# Patient Record
Sex: Female | Born: 1970 | Race: White | Hispanic: No | Marital: Single | Smoking: Never smoker
Health system: Southern US, Community
[De-identification: ages and names within clinical notes are randomized; demographics above are authoritative.]

## PROBLEM LIST (undated history)

## (undated) DIAGNOSIS — Z923 Personal history of irradiation: Secondary | ICD-10-CM

## (undated) DIAGNOSIS — C50919 Malignant neoplasm of unspecified site of unspecified female breast: Secondary | ICD-10-CM

## (undated) DIAGNOSIS — I499 Cardiac arrhythmia, unspecified: Secondary | ICD-10-CM

## (undated) DIAGNOSIS — Z9221 Personal history of antineoplastic chemotherapy: Secondary | ICD-10-CM

## (undated) DIAGNOSIS — Z1371 Encounter for nonprocreative screening for genetic disease carrier status: Secondary | ICD-10-CM

## (undated) HISTORY — DX: Malignant neoplasm of unspecified site of unspecified female breast: C50.919

## (undated) HISTORY — DX: Encounter for nonprocreative screening for genetic disease carrier status: Z13.71

---

## 2011-06-19 ENCOUNTER — Emergency Department: Payer: Self-pay | Admitting: Emergency Medicine

## 2011-06-19 LAB — CBC
HCT: 43 % (ref 35.0–47.0)
HGB: 14.8 g/dL (ref 12.0–16.0)
MCH: 32 pg (ref 26.0–34.0)
MCV: 93 fL (ref 80–100)
RBC: 4.62 10*6/uL (ref 3.80–5.20)
WBC: 11.4 10*3/uL — ABNORMAL HIGH (ref 3.6–11.0)

## 2011-06-19 LAB — BASIC METABOLIC PANEL
Anion Gap: 14 (ref 7–16)
Creatinine: 0.75 mg/dL (ref 0.60–1.30)
EGFR (African American): >60
EGFR (Non-African Amer.): >60
Glucose: 103 mg/dL — ABNORMAL HIGH (ref 65–99)
Osmolality: 282 (ref 275–301)
Potassium: 3.8 mmol/L (ref 3.5–5.1)

## 2011-06-19 LAB — TROPONIN I: Troponin-I: <0.02 ng/mL

## 2011-06-20 LAB — TROPONIN I: Troponin-I: <0.02 ng/mL

## 2011-06-20 LAB — CK TOTAL AND CKMB (NOT AT ARMC): CK-MB: 0.5 ng/mL (ref 0.5–3.6)

## 2011-09-17 ENCOUNTER — Ambulatory Visit: Payer: Self-pay | Admitting: Otolaryngology

## 2012-09-22 ENCOUNTER — Ambulatory Visit: Payer: Self-pay | Admitting: Internal Medicine

## 2013-05-19 HISTORY — PX: TONSILLECTOMY: SUR1361

## 2016-09-16 DIAGNOSIS — Z1371 Encounter for nonprocreative screening for genetic disease carrier status: Secondary | ICD-10-CM

## 2016-09-16 HISTORY — DX: Encounter for nonprocreative screening for genetic disease carrier status: Z13.71

## 2017-09-22 ENCOUNTER — Encounter: Payer: Self-pay | Admitting: *Deleted

## 2017-09-22 ENCOUNTER — Other Ambulatory Visit: Payer: Self-pay | Admitting: Internal Medicine

## 2017-09-22 DIAGNOSIS — Z1231 Encounter for screening mammogram for malignant neoplasm of breast: Secondary | ICD-10-CM

## 2017-09-22 DIAGNOSIS — N63 Unspecified lump in unspecified breast: Secondary | ICD-10-CM

## 2017-09-23 ENCOUNTER — Other Ambulatory Visit: Payer: Self-pay | Admitting: Internal Medicine

## 2017-09-23 DIAGNOSIS — N63 Unspecified lump in unspecified breast: Secondary | ICD-10-CM

## 2017-09-30 ENCOUNTER — Ambulatory Visit
Admission: RE | Admit: 2017-09-30 | Discharge: 2017-09-30 | Disposition: A | Discharge status: Home / Self Care | Payer: Managed Care, Other (non HMO) | Source: Ambulatory Visit | Attending: Internal Medicine | Admitting: Internal Medicine

## 2017-09-30 ENCOUNTER — Other Ambulatory Visit: Payer: Self-pay | Admitting: Internal Medicine

## 2017-09-30 ENCOUNTER — Encounter (HOSPITAL_COMMUNITY): Payer: Self-pay

## 2017-09-30 DIAGNOSIS — N6310 Unspecified lump in the right breast, unspecified quadrant: Secondary | ICD-10-CM

## 2017-09-30 DIAGNOSIS — R928 Other abnormal and inconclusive findings on diagnostic imaging of breast: Secondary | ICD-10-CM

## 2017-09-30 DIAGNOSIS — N63 Unspecified lump in unspecified breast: Secondary | ICD-10-CM

## 2017-09-30 DIAGNOSIS — C50919 Malignant neoplasm of unspecified site of unspecified female breast: Secondary | ICD-10-CM

## 2017-09-30 DIAGNOSIS — C50911 Malignant neoplasm of unspecified site of right female breast: Secondary | ICD-10-CM | POA: Diagnosis not present

## 2017-09-30 HISTORY — DX: Malignant neoplasm of unspecified site of unspecified female breast: C50.919

## 2017-09-30 HISTORY — PX: BREAST BIOPSY: SHX20

## 2017-10-02 ENCOUNTER — Other Ambulatory Visit: Payer: Self-pay | Admitting: Pathology

## 2017-10-08 ENCOUNTER — Encounter: Payer: Self-pay | Admitting: *Deleted

## 2017-10-08 NOTE — Progress Notes (Signed)
  Oncology Nurse Navigator Documentation  Navigator Location: [facility] (10/08/17 1600)   )Navigator Encounter Type: Introductory phone call (10/08/17 1600)   Abnormal Finding Date: 09/30/17 (10/08/17 1600) Confirmed Diagnosis Date: 10/01/17 (10/08/17 1600)                   Barriers/Navigation Needs: Education;Coordination of Care (10/08/17 1600) Education: Newly Diagnosed Cancer Education (10/08/17 1600) Interventions: Coordination of Care (10/08/17 1600)   Coordination of Care: Appts (10/08/17 1600)                  Time Spent with Patient: 45 (10/08/17 1600)   Called Dr. Jennette Kettle office today to inquire if patient had been notified of her diagnosis.  His office states she saw Dr. Lavera Guise on the 17th for results, and is scheduled to see Dr. Bary Castilla.  Called patient to establish navigation services.  States she wants to see Dr. Bary Castilla sooner than scheduled.  Told her I would try and work on that for her.  I have rescheduled her consult to Tuesday, 10/13/17 at 7:15 am.  She is to take a photo ID, all her meds and insurance cards. She came by the cancer center today and picked up  patient breast cancer educational literature, "My Breast Cancer Treatment Handbook" by Josephine Igo, RN.  She is to call if she has any questions or needs.

## 2017-10-12 ENCOUNTER — Encounter: Payer: Self-pay | Admitting: *Deleted

## 2017-10-13 ENCOUNTER — Other Ambulatory Visit: Payer: Self-pay | Admitting: General Surgery

## 2017-10-13 ENCOUNTER — Encounter: Payer: Self-pay | Admitting: General Surgery

## 2017-10-13 ENCOUNTER — Ambulatory Visit: Payer: Managed Care, Other (non HMO) | Admitting: General Surgery

## 2017-10-13 ENCOUNTER — Telehealth: Payer: Self-pay

## 2017-10-13 VITALS — BP 118/68 | HR 76 | Resp 12 | Ht 63.0 in | Wt 151.0 lb

## 2017-10-13 DIAGNOSIS — Z170 Estrogen receptor positive status [ER+]: Secondary | ICD-10-CM | POA: Diagnosis not present

## 2017-10-13 DIAGNOSIS — C50411 Malignant neoplasm of upper-outer quadrant of right female breast: Secondary | ICD-10-CM | POA: Diagnosis not present

## 2017-10-13 LAB — SURGICAL PATHOLOGY

## 2017-10-13 MED ORDER — LIDOCAINE-PRILOCAINE 2.5-2.5 % EX CREA: TOPICAL_CREAM | CUTANEOUS | 0 refills | Status: DC

## 2017-10-13 NOTE — Progress Notes (Signed)
emla

## 2017-10-13 NOTE — Telephone Encounter (Signed)
Call to patient about their arrival time and location. The patient will report to the Radiology desk in the [place] at [facility] on 10/26/17 at 8:45 am per scheduling.

## 2017-10-13 NOTE — Progress Notes (Signed)
Patient ID: Crystal Branch, female   DOB: [birth year], 47 y.o.   MRN: [id number]  Chief Complaint  Patient presents with  . Breast Problem    HPI Crystal Branch is a 47 y.o. female.  who presents for a breast evaluation referred by Dr Lavera Guise. The most recent mammogram and right breast biopsy was done on 09-30-17.  Patient does perform regular self breast checks but does not get regular mammograms done.   She states she found a lump prior to her shower the first part of May. She states it was about the size of the end of her finger. She works in Architectural technologist at [employer].  HPI  Past Medical History:  Diagnosis Date  . 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 TONSILLECTOMY  2015    Family History  Problem Relation Age of Onset  . Breast cancer Maternal Aunt 60  . Breast cancer Maternal Grandmother 62    Social History Social History   Tobacco Use  . Smoking status: Never Smoker  . Smokeless tobacco: Never Used  Substance Use Topics  . Alcohol use: Never    Frequency: Never  . Drug use: Never    No Known Allergies  Current Outpatient Medications  Medication Sig Dispense Refill  . Misc Natural Products (OSTEO BI-FLEX JOINT SHIELD PO) Take by mouth daily.    . Multiple Vitamin (MULTIVITAMIN) capsule Take 1 capsule by mouth daily.    . Probiotic Product ([place]) CAPS Take by mouth daily.     No current facility-administered medications for this visit.     Review of Systems Review of Systems  Constitutional: Negative.   Respiratory: Negative.   Cardiovascular: Negative.     Blood pressure 118/68, pulse 76, resp. rate 12, height 5' 3"  (1.6 m), weight 151 lb (68.5 kg), last menstrual period 09/24/2017, SpO2 98 %.  Physical  Exam Physical Exam  Constitutional: She is oriented to person, place, and time. She appears well-developed and well-nourished.  HENT:  Mouth/Throat: Oropharynx is clear and moist.  Eyes: Conjunctivae are normal. No scleral icterus.  Neck: Neck supple.  Cardiovascular: Normal rate, regular rhythm and normal heart sounds.  Pulmonary/Chest: Effort normal and breath sounds normal. Right breast exhibits skin change. Right breast exhibits no inverted nipple, no mass, no nipple discharge and no tenderness. Left breast exhibits no inverted nipple, no mass, no nipple discharge, no skin change and no tenderness.  Bruising right breast biopsy site  Lymphadenopathy:    She has no cervical adenopathy.    She has no axillary adenopathy.  Neurological: She is alert and oriented to person, place, and time.  Skin: Skin is warm and dry.  Psychiatric: Her behavior is normal.    Data Reviewed A. RIGHT BREAST, 12:00, 3 CM FN; ULTRASOUND GUIDED CORE BIOPSY:  - INVASIVE MAMMARY CARCINOMA WITH MUCINOUS FEATURES.   Size of invasive carcinoma: 9 mm in this sample  Histologic grade of invasive carcinoma: Grade 1            Glandular/tubular differentiation score: 2            Nuclear pleomorphism score: 2            Mitotic rate score: 1  Total score: 5  Ductal carcinoma in situ: Not identified  Lymphovascular invasion: Not identified  BREAST BIOMARKER TESTS  Estrogen Receptor (ER) Status: POSITIVE  Percentage of cells with nuclear positivity: >90%  Average intensity of staining: Strong   Progesterone Receptor (PgR) Status: POSITIVE  Percentage of cells with nuclear positivity: >90%  Average intensity of staining: Moderate to strong   Bilateral diagnostic mammogram and right breast ultrasound of Sep 30, 2017 reviewed.  BI-RADS-5.  Left breast normal.  Single foci of abnormality in the right breast.  Assessment    Stage I, T1c, NX carcinoma of the  right breast, ER/PR positive.  HER-2/neu status pending.      Plan    The majority of the visit was spent reviewing the options for breast cancer treatment. Breast conservation with lumpectomy and radiation therapy  was presented as equivalent to mastectomy for long-term control. The pros and cons of each treatment regimen were reviewed. The indications for additional therapy such as chemotherapy were touched on briefly, realizing that the majority of information required to determine if chemotherapy would be of benefit is not available at this time.  The availability of second surgical opinion reviewed.  The patient's grandmother had a mastectomy, her maternal aunt had breast conservation surgery, given the 10-day interval of time the patient has had to review her options on her own since her biopsy results became available she is very much interested in breast conservation.  She is aware that the HER-2/neu is pending, but this would not change her options for breast management, only change recommendations regarding adjuvant chemotherapy.  Based on the T1c size, no indication for neoadjuvant chemotherapy.  Role of radiation therapy after breast conservation review.  Website information provided.  As she is under 50 I recommended genetic testing.  Although she has no children she does have a brother and this might be important, and for the patient herself the potential risk for ovarian cancer would be good to know.  This would not mandate mastectomy, and results are not needed prior to surgical intervention..  Laboratory testing will include routine laboratory studies including a CA 27-29 and CEA.  The patient has been encouraged to make use of EMLA cream 1 hour prior to presentation for sentinel node biopsy to minimize discomfort during injection.  A prescription will be sent to her pharmacy.   HPI, Physical Exam, Assessment and Plan have been scribed under the direction and in the  presence of Robert Bellow, MD. Karie Fetch, RN  I have completed the exam and reviewed the above documentation for accuracy and completeness.  I agree with the above.  Haematologist has been used and any errors in dictation or transcription are unintentional.  [name], M.D., F.A.C.S.  The patient is scheduled for surgery at [facility] on 10/26/17. She will pre admit by phone. We will call the patient with her arrival time for surgery once scheduled. The patient is aware of date, and instructions.  Documented by Caryl-Lyn Otis Brace LPN  Forest Gleason Byrnett 10/13/2017, 9:28 AM

## 2017-10-13 NOTE — Patient Instructions (Addendum)
The patient is aware to call back for any questions or concerns.  The patient is scheduled for surgery at [facility] on 10/26/17. She will pre admit by phone. We will call the patient with her arrival time for surgery once scheduled. The patient is aware of date, and instructions.

## 2017-10-14 ENCOUNTER — Telehealth: Payer: Self-pay | Admitting: *Deleted

## 2017-10-14 NOTE — Telephone Encounter (Signed)
-----   Message from Robert Bellow, MD sent at 10/14/2017  8:52 AM EDT ----- Please notify the patient all of yesterday's labs are okay.  Genetic testing will take a couple of weeks. ----- Message ----- From: Lavone Neri Lab Results In Sent: 10/13/2017   4:35 PM To: Robert Bellow, MD

## 2017-10-15 ENCOUNTER — Encounter: Payer: Self-pay | Admitting: General Surgery

## 2017-10-15 ENCOUNTER — Telehealth: Payer: Self-pay | Admitting: General Surgery

## 2017-10-15 NOTE — Telephone Encounter (Signed)
The patient was notified that all of her laboratory studies today are normal.  Genetic testing pending.  The tumor is ER/PR positive, HER-2/neu negative.  No evident acute's indication at this time for neoadjuvant treatment.  Reinforced the need to use EMLA cream prior to presenting to the hospital the morning of surgery to minimize discomfort during sentinel node injection.

## 2017-10-19 ENCOUNTER — Other Ambulatory Visit: Payer: Self-pay

## 2017-10-19 ENCOUNTER — Encounter
Admission: RE | Admit: 2017-10-19 | Discharge: 2017-10-19 | Disposition: A | Discharge status: Home / Self Care | Payer: 59 | Source: Ambulatory Visit | Attending: General Surgery | Admitting: General Surgery

## 2017-10-19 HISTORY — DX: Cardiac arrhythmia, unspecified: I49.9

## 2017-10-19 NOTE — Patient Instructions (Signed)
Your procedure is scheduled on: 10/26/17 Report to Radiology. AT 0800 AM   Remember: Instructions that are not followed completely may result in serious medical risk, up to and including death, or upon the discretion of your surgeon and anesthesiologist your surgery may need to be rescheduled.     _X__ 1. Do not eat food after midnight the night before your procedure.                 No gum chewing or hard candies. You may drink clear liquids up to 2 hours                 before you are scheduled to arrive for your surgery- DO not drink clear                 liquids within 2 hours of the start of your surgery.                 Clear Liquids include:  water, apple juice without pulp, clear carbohydrate                 drink such as Clearfast of Gartorade, Black Coffee or Tea (Do not add                 anything to coffee or tea).  __X__2.  On the morning of surgery brush your teeth with toothpaste and water, you                 may rinse your mouth with mouthwash if you wish.  Do not swallow any              toothpaste of mouthwash.     _X__ 3.  No Alcohol for 24 hours before or after surgery.   _X__ 4.  Do Not Smoke or use e-cigarettes For 24 Hours Prior to Your Surgery.                 Do not use any chewable tobacco products for at least 6 hours prior to                 surgery.  ____  5.  Bring all medications with you on the day of surgery if instructed.   ___X_  6.  Notify your doctor if there is any change in your medical condition      (cold, fever, infections).     Do not wear jewelry, make-up, hairpins, clips or nail polish. Do not wear lotions, powders, or perfumes. You may wear deodorant. Do not shave 48 hours prior to surgery. Men may shave face and neck. Do not bring valuables to the hospital.    [facility] is not responsible for any belongings or valuables.  Contacts, dentures or bridgework may not be worn into surgery. Leave your suitcase in the  car. After surgery it may be brought to your room. For patients admitted to the hospital, discharge time is determined by your treatment team.   Patients discharged the day of surgery will not be allowed to drive home.   Please read over the following fact sheets that you were given:   Surgical Site Infection Prevention    ____ Take these medicines the morning of surgery with A SIP OF WATER:    1. NONE  2.   3.   4.  5.  6.  ____ Fleet Enema (as directed)   __X__ Use CHG Soap as directed  ____ Use inhalers on  the day of surgery  ____ Stop metformin 2 days prior to surgery    ____ Take 1/2 of usual insulin dose the night before surgery. No insulin the morning          of surgery.   ____ Stop Coumadin/Plavix/aspirin on  ____ Stop Anti-inflammatories on   _X___ Stop supplements until after surgery.  STOP OSTEO BIFLEX UNTIL AFTER SURGERY  ____ Bring C-Pap to the hospital.

## 2017-10-20 ENCOUNTER — Encounter
Admission: RE | Admit: 2017-10-20 | Discharge: 2017-10-20 | Disposition: A | Discharge status: Home / Self Care | Payer: Managed Care, Other (non HMO) | Source: Ambulatory Visit | Attending: General Surgery | Admitting: General Surgery

## 2017-10-20 DIAGNOSIS — Z01810 Encounter for preprocedural cardiovascular examination: Secondary | ICD-10-CM | POA: Diagnosis not present

## 2017-10-23 ENCOUNTER — Telehealth: Payer: Self-pay

## 2017-10-23 NOTE — Telephone Encounter (Signed)
Call to patient an notified of new arrival time for surgery at [facility] on 10/26/17. She is to report to the radiology desk that morning at 7:45 am. The patient is aware of date and time.

## 2017-10-26 ENCOUNTER — Encounter
Admission: RE | Disposition: A | Discharge status: Home / Self Care | Payer: Self-pay | Source: Ambulatory Visit | Attending: General Surgery

## 2017-10-26 ENCOUNTER — Ambulatory Visit (HOSPITAL_COMMUNITY): Payer: 59

## 2017-10-26 ENCOUNTER — Ambulatory Visit: Payer: Managed Care, Other (non HMO) | Admitting: Anesthesiology

## 2017-10-26 ENCOUNTER — Ambulatory Visit
Admission: RE | Admit: 2017-10-26 | Discharge: 2017-10-26 | Disposition: A | Discharge status: Home / Self Care | Payer: Managed Care, Other (non HMO) | Source: Ambulatory Visit | Attending: General Surgery | Admitting: General Surgery

## 2017-10-26 ENCOUNTER — Encounter
Admission: RE | Admit: 2017-10-26 | Discharge: 2017-10-26 | Disposition: A | Discharge status: Home / Self Care | Payer: Managed Care, Other (non HMO) | Source: Ambulatory Visit | Attending: General Surgery | Admitting: General Surgery

## 2017-10-26 ENCOUNTER — Encounter: Payer: Self-pay | Admitting: Anesthesiology

## 2017-10-26 ENCOUNTER — Ambulatory Visit: Payer: Managed Care, Other (non HMO)

## 2017-10-26 DIAGNOSIS — Z79899 Other long term (current) drug therapy: Secondary | ICD-10-CM | POA: Diagnosis not present

## 2017-10-26 DIAGNOSIS — Z803 Family history of malignant neoplasm of breast: Secondary | ICD-10-CM | POA: Insufficient documentation

## 2017-10-26 DIAGNOSIS — C50411 Malignant neoplasm of upper-outer quadrant of right female breast: Secondary | ICD-10-CM | POA: Insufficient documentation

## 2017-10-26 DIAGNOSIS — Z170 Estrogen receptor positive status [ER+]: Principal | ICD-10-CM

## 2017-10-26 DIAGNOSIS — N6310 Unspecified lump in the right breast, unspecified quadrant: Secondary | ICD-10-CM

## 2017-10-26 HISTORY — PX: BREAST LUMPECTOMY WITH SENTINEL LYMPH NODE BIOPSY: SHX5597

## 2017-10-26 HISTORY — PX: BREAST LUMPECTOMY: SHX2

## 2017-10-26 LAB — POCT PREGNANCY, URINE: Preg Test, Ur: NEGATIVE

## 2017-10-26 SURGERY — BREAST LUMPECTOMY WITH SENTINEL LYMPH NODE BX
Anesthesia: General | Site: Breast | Laterality: Right | Wound class: Clean

## 2017-10-26 MED ORDER — METHYLENE BLUE 0.5 % INJ SOLN
INTRAVENOUS | Status: AC
Start: 2017-10-26 — End: ?
  Filled 2017-10-26: qty 10

## 2017-10-26 MED ORDER — GABAPENTIN 300 MG PO CAPS
ORAL_CAPSULE | ORAL | Status: AC
  Administered 2017-10-26: 300 mg via ORAL
  Filled 2017-10-26: qty 1

## 2017-10-26 MED ORDER — EPHEDRINE SULFATE 50 MG/ML IJ SOLN
INTRAMUSCULAR | Status: DC | PRN
  Administered 2017-10-26: 10 mg via INTRAVENOUS

## 2017-10-26 MED ORDER — TECHNETIUM TC 99M SULFUR COLLOID FILTERED
0.7130 | Freq: Once | INTRAVENOUS | Status: AC | PRN
  Administered 2017-10-26: 0.713 via INTRADERMAL

## 2017-10-26 MED ORDER — ACETAMINOPHEN 10 MG/ML IV SOLN
INTRAVENOUS | Status: DC | PRN
  Administered 2017-10-26: 1000 mg via INTRAVENOUS

## 2017-10-26 MED ORDER — FENTANYL CITRATE (PF) 100 MCG/2ML IJ SOLN
INTRAMUSCULAR | Status: DC | PRN
  Administered 2017-10-26 (×4): 25 ug via INTRAVENOUS

## 2017-10-26 MED ORDER — MIDAZOLAM HCL 2 MG/2ML IJ SOLN
INTRAMUSCULAR | Status: DC | PRN
  Administered 2017-10-26: 2 mg via INTRAVENOUS

## 2017-10-26 MED ORDER — METHYLENE BLUE 0.5 % INJ SOLN
INTRAVENOUS | Status: DC | PRN
  Administered 2017-10-26: 6 mL via SUBMUCOSAL

## 2017-10-26 MED ORDER — FENTANYL CITRATE (PF) 100 MCG/2ML IJ SOLN
25.0000 ug | INTRAMUSCULAR | Status: DC | PRN
  Administered 2017-10-26 (×4): 25 ug via INTRAVENOUS

## 2017-10-26 MED ORDER — PROPOFOL 10 MG/ML IV BOLUS
INTRAVENOUS | Status: AC
  Filled 2017-10-26: qty 20

## 2017-10-26 MED ORDER — GABAPENTIN 300 MG PO CAPS
300.0000 mg | ORAL_CAPSULE | ORAL | Status: AC
  Administered 2017-10-26: 300 mg via ORAL

## 2017-10-26 MED ORDER — SODIUM CHLORIDE FLUSH 0.9 % IV SOLN
INTRAVENOUS | Status: AC
  Filled 2017-10-26: qty 10

## 2017-10-26 MED ORDER — FENTANYL CITRATE (PF) 100 MCG/2ML IJ SOLN
INTRAMUSCULAR | Status: AC
  Filled 2017-10-26: qty 2

## 2017-10-26 MED ORDER — CELECOXIB 200 MG PO CAPS
ORAL_CAPSULE | ORAL | Status: AC
  Administered 2017-10-26: 200 mg via ORAL
  Filled 2017-10-26: qty 1

## 2017-10-26 MED ORDER — GLYCOPYRROLATE 0.2 MG/ML IJ SOLN
INTRAMUSCULAR | Status: DC | PRN
  Administered 2017-10-26: 0.2 mg via INTRAVENOUS

## 2017-10-26 MED ORDER — LIDOCAINE HCL (CARDIAC) PF 100 MG/5ML IV SOSY
PREFILLED_SYRINGE | INTRAVENOUS | Status: DC | PRN
  Administered 2017-10-26: 60 mg via INTRAVENOUS

## 2017-10-26 MED ORDER — HYDROCODONE-ACETAMINOPHEN 5-325 MG PO TABS: 1.0000 | ORAL_TABLET | ORAL | 0 refills | Status: DC | PRN

## 2017-10-26 MED ORDER — CELECOXIB 200 MG PO CAPS
200.0000 mg | ORAL_CAPSULE | ORAL | Status: AC
  Administered 2017-10-26: 200 mg via ORAL

## 2017-10-26 MED ORDER — ACETAMINOPHEN 10 MG/ML IV SOLN
INTRAVENOUS | Status: AC
  Filled 2017-10-26: qty 100

## 2017-10-26 MED ORDER — ONDANSETRON HCL 4 MG/2ML IJ SOLN
4.0000 mg | Freq: Once | INTRAMUSCULAR | Status: AC | PRN
  Administered 2017-10-26: 4 mg via INTRAVENOUS

## 2017-10-26 MED ORDER — FENTANYL CITRATE (PF) 100 MCG/2ML IJ SOLN
INTRAMUSCULAR | Status: AC
  Administered 2017-10-26: 25 ug via INTRAVENOUS
  Filled 2017-10-26: qty 2

## 2017-10-26 MED ORDER — MIDAZOLAM HCL 2 MG/2ML IJ SOLN
INTRAMUSCULAR | Status: AC
  Filled 2017-10-26: qty 2

## 2017-10-26 MED ORDER — PROPOFOL 10 MG/ML IV BOLUS
INTRAVENOUS | Status: DC | PRN
  Administered 2017-10-26: 140 mg via INTRAVENOUS
  Administered 2017-10-26: 50 mg via INTRAVENOUS

## 2017-10-26 MED ORDER — ONDANSETRON HCL 4 MG/2ML IJ SOLN
INTRAMUSCULAR | Status: DC | PRN
  Administered 2017-10-26: 4 mg via INTRAVENOUS

## 2017-10-26 MED ORDER — ONDANSETRON HCL 4 MG/2ML IJ SOLN
INTRAMUSCULAR | Status: AC
  Administered 2017-10-26: 4 mg via INTRAVENOUS
  Filled 2017-10-26: qty 2

## 2017-10-26 MED ORDER — FENTANYL CITRATE (PF) 100 MCG/2ML IJ SOLN: INTRAMUSCULAR | Status: DC | PRN

## 2017-10-26 MED ORDER — FAMOTIDINE 20 MG PO TABS
20.0000 mg | ORAL_TABLET | Freq: Once | ORAL | Status: AC
  Administered 2017-10-26: 20 mg via ORAL

## 2017-10-26 MED ORDER — DEXAMETHASONE SODIUM PHOSPHATE 10 MG/ML IJ SOLN
INTRAMUSCULAR | Status: DC | PRN
  Administered 2017-10-26: 6 mg via INTRAVENOUS

## 2017-10-26 MED ORDER — BUPIVACAINE-EPINEPHRINE (PF) 0.5% -1:200000 IJ SOLN
INTRAMUSCULAR | Status: DC | PRN
  Administered 2017-10-26: 20 mL via PERINEURAL

## 2017-10-26 MED ORDER — LACTATED RINGERS IV SOLN
INTRAVENOUS | Status: DC
  Administered 2017-10-26: 09:00:00 via INTRAVENOUS

## 2017-10-26 MED ORDER — FAMOTIDINE 20 MG PO TABS
ORAL_TABLET | ORAL | Status: AC
  Administered 2017-10-26: 20 mg via ORAL
  Filled 2017-10-26: qty 1

## 2017-10-26 SURGICAL SUPPLY — 56 items
BENZOIN TINCTURE PRP APPL 2/3 (GAUZE/BANDAGES/DRESSINGS) ×3 IMPLANT
BINDER BREAST LRG (GAUZE/BANDAGES/DRESSINGS) IMPLANT
BINDER BREAST MEDIUM (GAUZE/BANDAGES/DRESSINGS) ×6 IMPLANT
BINDER BREAST XLRG (GAUZE/BANDAGES/DRESSINGS) IMPLANT
BINDER BREAST XXLRG (GAUZE/BANDAGES/DRESSINGS) IMPLANT
BLADE SURG 15 STRL SS SAFETY (BLADE) ×6 IMPLANT
BULB RESERV EVAC DRAIN JP 100C (MISCELLANEOUS) IMPLANT
CANISTER SUCT 1200ML W/VALVE (MISCELLANEOUS) ×3 IMPLANT
CHLORAPREP W/TINT 26ML (MISCELLANEOUS) ×3 IMPLANT
CLOSURE WOUND 1/2 X4 (GAUZE/BANDAGES/DRESSINGS) ×1
CNTNR SPEC 2.5X3XGRAD LEK (MISCELLANEOUS)
CONT SPEC 4OZ STER OR WHT (MISCELLANEOUS)
CONTAINER SPEC 2.5X3XGRAD LEK (MISCELLANEOUS) IMPLANT
COVER PROBE FLX POLY STRL (MISCELLANEOUS) ×3 IMPLANT
DEVICE DUBIN SPECIMEN MAMMOGRA (MISCELLANEOUS) ×3 IMPLANT
DRAIN CHANNEL JP 15F RND 16 (MISCELLANEOUS) IMPLANT
DRAPE LAPAROTOMY TRNSV 106X77 (MISCELLANEOUS) ×3 IMPLANT
DRSG GAUZE FLUFF 36X18 (GAUZE/BANDAGES/DRESSINGS) ×3 IMPLANT
DRSG TELFA 3X8 NADH (GAUZE/BANDAGES/DRESSINGS) ×3 IMPLANT
DRSG TELFA 4X3 1S NADH ST (GAUZE/BANDAGES/DRESSINGS) ×3 IMPLANT
ELECT CAUTERY BLADE TIP 2.5 (TIP) ×3
ELECT REM PT RETURN 9FT ADLT (ELECTROSURGICAL) ×3
ELECTRODE CAUTERY BLDE TIP 2.5 (TIP) ×1 IMPLANT
ELECTRODE REM PT RTRN 9FT ADLT (ELECTROSURGICAL) ×1 IMPLANT
GAUZE SPONGE 4X4 12PLY STRL (GAUZE/BANDAGES/DRESSINGS) ×3 IMPLANT
GLOVE BIO SURGEON STRL SZ7.5 (GLOVE) ×3 IMPLANT
GLOVE INDICATOR 8.0 STRL GRN (GLOVE) ×3 IMPLANT
GOWN STRL REUS W/ TWL LRG LVL3 (GOWN DISPOSABLE) ×2 IMPLANT
GOWN STRL REUS W/TWL LRG LVL3 (GOWN DISPOSABLE) ×4
KIT TURNOVER KIT A (KITS) ×3 IMPLANT
LABEL OR SOLS (LABEL) ×3 IMPLANT
MARGIN MAP 10MM (MISCELLANEOUS) ×3 IMPLANT
NEEDLE HYPO 22GX1.5 SAFETY (NEEDLE) ×3 IMPLANT
NEEDLE HYPO 25X1 1.5 SAFETY (NEEDLE) ×6 IMPLANT
PACK BASIN MINOR ARMC (MISCELLANEOUS) ×3 IMPLANT
RETRACTOR RING XSMALL (MISCELLANEOUS) ×1 IMPLANT
RTRCTR WOUND ALEXIS 13CM XS SH (MISCELLANEOUS) ×3
SHEARS FOC LG CVD HARMONIC 17C (MISCELLANEOUS) IMPLANT
SHEARS HARMONIC 9CM CVD (BLADE) IMPLANT
SLEVE PROBE SENORX GAMMA FIND (MISCELLANEOUS) ×3 IMPLANT
STRIP CLOSURE SKIN 1/2X4 (GAUZE/BANDAGES/DRESSINGS) ×2 IMPLANT
SUT ETHILON 3-0 FS-10 30 BLK (SUTURE) ×3
SUT SILK 2 0 (SUTURE) ×2
SUT SILK 2-0 18XBRD TIE 12 (SUTURE) ×1 IMPLANT
SUT VIC AB 2-0 CT1 27 (SUTURE) ×6
SUT VIC AB 2-0 CT1 TAPERPNT 27 (SUTURE) ×3 IMPLANT
SUT VIC AB 3-0 SH 27 (SUTURE) ×4
SUT VIC AB 3-0 SH 27X BRD (SUTURE) ×2 IMPLANT
SUT VIC AB 4-0 FS2 27 (SUTURE) ×6 IMPLANT
SUT VICRYL+ 3-0 144IN (SUTURE) ×3 IMPLANT
SUTURE EHLN 3-0 FS-10 30 BLK (SUTURE) ×1 IMPLANT
SWABSTK COMLB BENZOIN TINCTURE (MISCELLANEOUS) ×3 IMPLANT
SYR 10ML LL (SYRINGE) ×3 IMPLANT
SYR BULB IRRIG 60ML STRL (SYRINGE) ×3 IMPLANT
TAPE TRANSPORE STRL 2 31045 (GAUZE/BANDAGES/DRESSINGS) ×3 IMPLANT
WATER STERILE IRR 1000ML POUR (IV SOLUTION) ×3 IMPLANT

## 2017-10-26 NOTE — Transfer of Care (Signed)
Immediate Anesthesia Transfer of Care Note  Patient: Crystal Branch  Procedure(s) Performed: BREAST LUMPECTOMY WITH SENTINEL LYMPH NODE BX (Right Breast)  Patient Location: PACU  Anesthesia Type:General  Level of Consciousness: awake and oriented  Airway & Oxygen Therapy: Patient Spontanous Breathing and Patient connected to face mask oxygen  Post-op Assessment: Report given to RN  Post vital signs: Reviewed and stable  Last Vitals:  Vitals Value Taken Time  BP 103/54 10/26/2017 10:48 AM  Temp    Pulse 64 10/26/2017 10:48 AM  Resp 14 10/26/2017 10:48 AM  SpO2 100 % 10/26/2017 10:48 AM  Vitals shown include unvalidated device data.  Last Pain:  Vitals:   10/26/17 0829  TempSrc: Oral         Complications: No apparent anesthesia complications

## 2017-10-26 NOTE — Op Note (Signed)
Preoperative diagnosis: Right breast cancer.  Desire for breast conservation.  Postoperative diagnosis: Same.  Operative procedure: Right breast wide excision with ultrasound guidance, sentinel lymph node biopsy.  Operating Surgeon: [name], MD.  Anesthesia: General by LMA, Marcaine 0.5% with 1 to 200,000 units of epinephrine, 30 cc.  Estimated blood loss: Less than 5 cc.  Clinical note: This 47 year old woman noted a palpable mass in the right breast.  Subsequent biopsy showed evidence of invasive carcinoma.  She desired breast conservation.  The patient underwent injection with technetium sulfur colloid prior to the procedure.  Operative note: After the induction of general anesthesia the area of the nipple areolar complex was cleansed with alcohol and 5 cc of 1/2% methylene blue was instilled in the subareolar plexus.  The breast chest and axilla was then cleansed with ChloraPrep and draped.  There was a palpable mass in the breast about the 11-12 o'clock position.  Ultrasound was used to confirm the extent of the lesions to ensure complete excision.  He was very close to the skin and for that reason an ellipse of skin orientated between the 10 and 1230 o'clock position was included with the resection.  Local anesthesia was infiltrated.  An elliptical incision was utilized and carried out the skin subtendinous tissue with hemostasis achieved by electrocautery.  This did not extend down to the pectoralis fascia due to the very superficial location of the mass.  Prior to removing the tissue from the breast specimen was orientated.  Subsequent palpation showed clear edges.  Specimen radiograph confirmed the clip in the center of the specimen.  Pathology report of the closest margin being superior and it +3 mm.  While the breast specimen was being processed attention was turned to the axilla.  The node seeker device was utilized.  Fairly low counts of just around 100 were appreciated in the  mid anterior aspect of the axilla.  Local anesthesia was infiltrated and a transverse incision made.  The skin was incised sharply and remaining dissection completed with electrocautery.  The axillary envelope was opened in a single hot, blue node measuring approximately 7 mm in diameter was identified.  After this was resected of low counts were noted in the inferior posterior aspect of the axilla and careful dissection through this area showed no additional nodal tissue.  Only one blue lymphatic was identified.  This went to the dominant lesion first appreciated on opening the axillary envelope.  Axillary envelope was closed with 2-0 Vicryl sutures and the adipose layer closed in similar fashion.  The skin was closed for the axillary wound with a running 4-0 Vicryl suture in a subcuticular location.  Attention was turned back to the breast.  The deep tissue was approximated with interrupted 2-0 Vicryl sutures.  A 7-8 mm flap was created superiorly to prevent undue traction on the nipple areolar complex.  This was extended for about 5 cm circumferentially on the upper half of the wound.  This was then approximated to the subcutaneous fat adjacent to the nipple areolar complex with interrupted 2-0 Vicryl sutures.  There is mild traction on the nipple areolar complex with this mobilization.  The skin was closed with a running 4-0 Vicryl subcuticular suture.  Benzoin, Steri-Strips followed by Telfa, fluff gauze and a compressive wrap was applied.  The patient tolerated the procedure well and was taken to recovery room in stable condition.

## 2017-10-26 NOTE — Anesthesia Post-op Follow-up Note (Signed)
Anesthesia QCDR form completed.        

## 2017-10-26 NOTE — Anesthesia Procedure Notes (Signed)
Procedure Name: LMA Insertion Date/Time: 10/26/2017 9:24 AM Performed by: Philbert Riser, CRNA Pre-anesthesia Checklist: Patient identified, Emergency Drugs available, Suction available, Patient being monitored and Timeout performed Patient Re-evaluated:Patient Re-evaluated prior to induction Oxygen Delivery Method: Circle system utilized and Simple face mask Preoxygenation: Pre-oxygenation with 100% oxygen Induction Type: IV induction LMA: LMA inserted LMA Size: 3.0

## 2017-10-26 NOTE — Anesthesia Preprocedure Evaluation (Signed)
Anesthesia Evaluation  Patient identified by MRN, date of birth, ID band Patient awake    Reviewed: Allergy & Precautions, NPO status , Patient's Chart, lab work & pertinent test results, reviewed documented beta blocker date and time   Airway Mallampati: II  TM Distance: >3 FB     Dental  (+) Chipped   Pulmonary           Cardiovascular      Neuro/Psych    GI/Hepatic   Endo/Other    Renal/GU      Musculoskeletal   Abdominal   Peds  Hematology   Anesthesia Other Findings EKG ok.  Reproductive/Obstetrics                             Anesthesia Physical Anesthesia Plan  ASA: II  Anesthesia Plan: General   Post-op Pain Management:    Induction: Intravenous  PONV Risk Score and Plan:   Airway Management Planned: LMA  Additional Equipment:   Intra-op Plan:   Post-operative Plan:   Informed Consent: I have reviewed the patients History and Physical, chart, labs and discussed the procedure including the risks, benefits and alternatives for the proposed anesthesia with the patient or authorized representative who has indicated his/her understanding and acceptance.     Plan Discussed with: CRNA  Anesthesia Plan Comments:         Anesthesia Quick Evaluation

## 2017-10-26 NOTE — Anesthesia Postprocedure Evaluation (Signed)
Anesthesia Post Note  Patient: Crystal Branch  Procedure(s) Performed: BREAST LUMPECTOMY WITH SENTINEL LYMPH NODE BX (Right Breast)  Patient location during evaluation: PACU Anesthesia Type: General Level of consciousness: awake and alert Pain management: pain level controlled Vital Signs Assessment: post-procedure vital signs reviewed and stable Respiratory status: spontaneous breathing, nonlabored ventilation, respiratory function stable and patient connected to nasal cannula oxygen Cardiovascular status: blood pressure returned to baseline and stable Postop Assessment: no apparent nausea or vomiting Anesthetic complications: no     Last Vitals:  Vitals:   10/26/17 1130 10/26/17 1201  BP:  132/64  Pulse: 66 (!) 50  Resp: (!) 22 14  Temp: 37 C 36.5 C  SpO2: 100% 95%    Last Pain:  Vitals:   10/26/17 1201  TempSrc: Oral  PainSc: 3                  [name] S

## 2017-10-26 NOTE — Discharge Instructions (Signed)
Urine will be green for the next 24 hours.   AMBULATORY SURGERY  DISCHARGE INSTRUCTIONS   1) The drugs that you were given will stay in your system until tomorrow so for the next 24 hours you should not:  A) Drive an automobile B) Make any legal decisions C) Drink any alcoholic beverage   2) You may resume regular meals tomorrow.  Today it is better to start with liquids and gradually work up to solid foods.  You may eat anything you prefer, but it is better to start with liquids, then soup and crackers, and gradually work up to solid foods.   3) Please notify your doctor immediately if you have any unusual bleeding, trouble breathing, redness and pain at the surgery site, drainage, fever, or pain not relieved by medication.    4) Additional Instructions:        Please contact your physician with any problems or Same Day Surgery at [phone], Monday through Friday 6 am to 4 pm, or [place] at [facility] number at [phone].

## 2017-10-26 NOTE — Anesthesia Procedure Notes (Signed)
Date/Time: 10/26/2017 9:15 AM Performed by: Philbert Riser, CRNA Pre-anesthesia Checklist: Patient identified, Emergency Drugs available, Suction available, Patient being monitored and Timeout performed Patient Re-evaluated:Patient Re-evaluated prior to induction Preoxygenation: Pre-oxygenation with 100% oxygen Induction Type: IV induction Ventilation: Mask ventilation without difficulty LMA: LMA inserted LMA Size: 4.0 Number of attempts: 1 Placement Confirmation: ETT inserted through vocal cords under direct vision,  positive ETCO2 and breath sounds checked- equal and bilateral Tube secured with: Tape Dental Injury: Teeth and Oropharynx as per pre-operative assessment

## 2017-10-26 NOTE — H&P (Signed)
No interval change in clinical history or exam.  Her right breast wide excision and sentinel node biopsy.

## 2017-10-27 ENCOUNTER — Encounter: Payer: Self-pay | Admitting: General Surgery

## 2017-10-27 LAB — SURGICAL PATHOLOGY

## 2017-10-28 ENCOUNTER — Telehealth: Payer: Self-pay | Admitting: *Deleted

## 2017-10-28 NOTE — Telephone Encounter (Signed)
Notified patient as instructed, patient pleased. Discussed follow-up appointments, patient agrees She is aware that a genetic counselor will be calling as required by insurance for BRCA testing to proceed. Order Form faxed for Mammoprint testing. 

## 2017-10-28 NOTE — Telephone Encounter (Signed)
-----  Message from Robert Bellow, MD sent at 10/28/2017 10:09 AM EDT ----- Please notify the patient that final pathology has been received.  Clear margins.  Negative lymph node.  Please have the sample sent off for Mammoprint if we have not done this already.  Thank you graph the view contract on her genetic testing through [facility] for BRCA.

## 2017-10-29 ENCOUNTER — Ambulatory Visit: Payer: Self-pay | Admitting: General Surgery

## 2017-10-29 ENCOUNTER — Other Ambulatory Visit: Payer: Self-pay

## 2017-11-03 ENCOUNTER — Ambulatory Visit: Payer: Self-pay

## 2017-11-03 ENCOUNTER — Encounter: Payer: Self-pay | Admitting: General Surgery

## 2017-11-03 ENCOUNTER — Ambulatory Visit (INDEPENDENT_AMBULATORY_CARE_PROVIDER_SITE_OTHER): Payer: Managed Care, Other (non HMO) | Admitting: General Surgery

## 2017-11-03 VITALS — BP 118/70 | HR 72 | Resp 13 | Ht 63.0 in | Wt 151.0 lb

## 2017-11-03 DIAGNOSIS — C50411 Malignant neoplasm of upper-outer quadrant of right female breast: Secondary | ICD-10-CM

## 2017-11-03 DIAGNOSIS — Z170 Estrogen receptor positive status [ER+]: Principal | ICD-10-CM

## 2017-11-03 NOTE — Progress Notes (Signed)
Patient ID: GILBERTA Branch, female   DOB: 03-02-1971, 47 y.o.   MRN: [id number]  Chief Complaint  Patient presents with  . Routine Post Op    HPI Crystal Branch is a 47 y.o. female.  Here for postoperative visit, right breast lumpectomy on 10-26-17. She states she is doing well and has a central neck/chest itching. Hydrocortisone has helped.  HPI  Past Medical History:  Diagnosis Date  . Breast CA (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   Marland Kitchen Dysrhythmia     Past Surgical History:  Procedure Laterality Date  . 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 BREAST LUMPECTOMY Right 10/26/2017  . BREAST LUMPECTOMY WITH SENTINEL LYMPH NODE BIOPSY Right 10/26/2017   Procedure: BREAST LUMPECTOMY WITH SENTINEL LYMPH NODE BX;  Surgeon: Robert Bellow, MD;  Location: [facility] ORS;  Service: General;  Laterality: Right;  . TONSILLECTOMY  2015    Family History  Problem Relation Age of Onset  . Breast cancer Maternal Aunt 60  . Breast cancer Maternal Grandmother 41    Social History Social History   Tobacco Use  . Smoking status: Never Smoker  . Smokeless tobacco: Never Used  Substance Use Topics  . Alcohol use: Never    Frequency: Never  . Drug use: Never    No Known Allergies  Current Outpatient Medications  Medication Sig Dispense Refill  . acetaminophen (TYLENOL) 325 MG tablet Take 325 mg by mouth every 6 (six) hours as needed (for pain).    . Misc Natural Products (OSTEO BI-FLEX JOINT SHIELD PO) Take 1 capsule by mouth 2 (two) times daily.     . Multiple Vitamin (MULTIVITAMIN WITH MINERALS) TABS tablet Take 1 tablet by mouth daily.    . Probiotic Product ([place]) CAPS Take 1 capsule by mouth daily with supper.      No current facility-administered medications for this visit.     Review of Systems Review of Systems   Constitutional: Negative.   Respiratory: Negative.   Cardiovascular: Negative.     Blood pressure 118/70, pulse 72, resp. rate 13, height 5\' 3"  (1.6 m), weight 151 lb (68.5 kg), last menstrual period 10/21/2017, SpO2 97 %.  Physical Exam Physical Exam  Constitutional: She is oriented to person, place, and time. She appears well-developed and well-nourished.  Pulmonary/Chest:  Bruising at right lumpectomy site  Neurological: She is alert and oriented to person, place, and time.  Skin: Skin is warm and dry.  Psychiatric: Her behavior is normal.    Data Reviewed DIAGNOSIS:  A. RIGHT BREAST, UPPER OUTER QUADRANT, WIDE EXCISION WITH ULTRASOUND  GUIDANCE:  - INVASIVE MAMMARY CARCINOMA OF NO SPECIAL TYPE.  - BIOPSY SITE CHANGES, MARKER CLIP PRESENT.  - SEE SUMMARY BELOW.  Distance from closest margin: 1.5 mm            Specify closest margin: Superior             DCIS Margins: Uninvolved by DCIS            Distance from closest margin: 8 mm            Specify closest margin: Inferior  B. SENTINEL LYMPH NODE; EXCISION:  - NO TUMOR SEEN IN ONE LYMPH NODE (0/1).  pT1c pN0   Ultrasound examination of the wide excision site shows a small seroma measuring 0.7 x 3.2 x  3.6 cm.  This is at its close 0.62 cm to the overlying skin.  No adequate cavity for accelerated partial breast radiation.  Mammoprint results pending  Assessment    Doing well post wide excision.  Candidate for Mammoprint testing based on 11 mm tumor.  Candidate for whole breast radiation.    Plan    Follow up 3 weeks Appointment with Dr Donella Stade, Radiation Oncology, for whole breast radiation. The patient is aware to use a heating pad as needed for comfort.      HPI, Physical Exam, Assessment and Plan have been scribed under the direction and in the presence of Robert Bellow, MD. Crystal Fetch, RN  I have completed the exam and reviewed the above  documentation for accuracy and completeness.  I agree with the above.  Haematologist has been used and any errors in dictation or transcription are unintentional.  [name], M.D., F.A.C.S.  Crystal Branch 11/03/2017, 9:00 PM

## 2017-11-03 NOTE — Patient Instructions (Addendum)
The patient is aware to call back for any questions or concerns.  Appointment with Dr Donella Stade, Radiation Oncology for whole breast radiation.Appointment is scheduled for 11-11-17 at 2:30 pm.  Patient informed.  MB  The patient is aware to use a heating pad as needed for comfort.

## 2017-11-10 ENCOUNTER — Telehealth: Payer: Self-pay | Admitting: *Deleted

## 2017-11-10 NOTE — Telephone Encounter (Signed)
The patient was notified that her Mammoprint score shows her to be at high risk of recurrent disease without adjuvant chemotherapy.  She is amenable to meeting with medical oncology.  She is presently scheduled to meet with Dr Baruch Gouty from radiation oncology tomorrow.  She will keep that appointment, but is aware that if adjuvant chemotherapy is appropriate, this might be more appropriately administered be done before radiation.

## 2017-11-10 NOTE — Telephone Encounter (Signed)
Patient called back and is returning your call from yesterday

## 2017-11-11 ENCOUNTER — Other Ambulatory Visit: Payer: Self-pay

## 2017-11-11 ENCOUNTER — Ambulatory Visit
Admission: RE | Admit: 2017-11-11 | Discharge: 2017-11-11 | Disposition: A | Discharge status: Home / Self Care | Payer: Managed Care, Other (non HMO) | Source: Ambulatory Visit | Attending: Radiation Oncology | Admitting: Radiation Oncology

## 2017-11-11 ENCOUNTER — Other Ambulatory Visit: Payer: Self-pay | Admitting: *Deleted

## 2017-11-11 ENCOUNTER — Telehealth: Payer: Self-pay | Admitting: *Deleted

## 2017-11-11 ENCOUNTER — Encounter: Payer: Self-pay | Admitting: Radiation Oncology

## 2017-11-11 VITALS — BP 125/72 | HR 65 | Temp 97.4°F | Resp 20 | Ht 63.0 in | Wt 149.8 lb

## 2017-11-11 DIAGNOSIS — C50411 Malignant neoplasm of upper-outer quadrant of right female breast: Secondary | ICD-10-CM | POA: Diagnosis not present

## 2017-11-11 DIAGNOSIS — Z170 Estrogen receptor positive status [ER+]: Secondary | ICD-10-CM | POA: Insufficient documentation

## 2017-11-11 DIAGNOSIS — Z79899 Other long term (current) drug therapy: Secondary | ICD-10-CM | POA: Insufficient documentation

## 2017-11-11 NOTE — Consult Note (Signed)
NEW PATIENT EVALUATION  Name: Crystal Branch  MRN: [id number]  Date:   11/11/2017     DOB: [birth year]   This 47 y.o. 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  REFERRING PHYSICIAN: Cletis Athens, MD  CHIEF COMPLAINT:  Chief Complaint  Patient presents with  . Breast Cancer    Pt is here for initial consultation of breast cancer.      DIAGNOSIS: The encounter diagnosis was Malignant neoplasm of upper-outer quadrant of right breast in female, estrogen receptor positive ([place]).   PREVIOUS INVESTIGATIONS:  Pathology reports reviewed Mammograms and ultrasound reviewed Clinical notes reviewed  HPI: patient is a 47 year old female who does not have regular mammogramspresented with a self discovered mass in the right breast. Mammogram and ultrasound confirmed a focal density with associated architectural distortion in the upper outer quadrant of the right breast corresponding to the palpable abnormality.targeted ultrasound revealed a 1.1 cm abnormality again in the upper outer quadrant superficially just below the skin. Biopsy was positive for invasive mammary carcinoma. Patient underwent wide local excision and sentinel node biopsyshowing a 1.1 cm invasive mammary carcinoma overall grade 1.margins were clear at 1.5 mm. One sentinel lymph node was negative. Again tumor was ER/PR positive HER-2/neu negative. Patient underwent MammaPrint showing a high risk for recurrence with recognition for systemic chemotherapy. She has not yet seen a medical oncologist although has an appointment early next week. She seen today for consideration of radiation therapy. She is doing well. She specifically denies breast tenderness cough or bone pain.  PLANNED TREATMENT REGIMEN: this systemic chemotherapy plus whole breast radiation  hypofractionated regiment  PAST MEDICAL HISTORY:  has a past medical history of Breast CA ([place]) (09/30/2017) and Dysrhythmia.    PAST SURGICAL HISTORY:  Past Surgical History:  Procedure Laterality Date  . 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 BREAST LUMPECTOMY Right 10/26/2017  . BREAST LUMPECTOMY WITH SENTINEL LYMPH NODE BIOPSY Right 10/26/2017   Procedure: BREAST LUMPECTOMY WITH SENTINEL LYMPH NODE BX;  Surgeon: Robert Bellow, MD;  Location: [facility] ORS;  Service: General;  Laterality: Right;  . TONSILLECTOMY  2015    FAMILY HISTORY: family history includes Breast cancer (age of onset: 59) in her maternal grandmother; Breast cancer (age of onset: 65) in her maternal aunt.  SOCIAL HISTORY:  reports that she has never smoked. She has never used smokeless tobacco. She reports that she does not drink alcohol or use drugs.  ALLERGIES: Patient has no known allergies.  MEDICATIONS:  Current Outpatient Medications  Medication Sig Dispense Refill  . acetaminophen (TYLENOL) 325 MG tablet Take 325 mg by mouth every 6 (six) hours as needed (for pain).    . Misc Natural Products (OSTEO BI-FLEX JOINT SHIELD PO) Take 1 capsule by mouth 2 (two) times daily.     . Multiple Vitamin (MULTIVITAMIN WITH MINERALS) TABS tablet Take 1 tablet by mouth daily.    . Probiotic Product ([place]) CAPS Take 1 capsule by mouth daily with supper.      No current facility-administered medications for this encounter.     ECOG PERFORMANCE STATUS:  0 - Asymptomatic  REVIEW OF SYSTEMS:  Patient denies any weight loss, fatigue, weakness, fever, chills or night sweats. Patient denies any loss of vision, blurred  vision. Patient denies any ringing  of the ears or hearing loss. No irregular heartbeat. Patient denies heart murmur or history of fainting. Patient denies any chest pain or pain radiating to her upper extremities.  Patient denies any shortness of breath, difficulty breathing at night, cough or hemoptysis. Patient denies any swelling in the lower legs. Patient denies any nausea vomiting, vomiting of blood, or coffee ground material in the vomitus. Patient denies any stomach pain. Patient states has had normal bowel movements no significant constipation or diarrhea. Patient denies any dysuria, hematuria or significant nocturia. Patient denies any problems walking, swelling in the joints or loss of balance. Patient denies any skin changes, loss of hair or loss of weight. Patient denies any excessive worrying or anxiety or significant depression. Patient denies any problems with insomnia. Patient denies excessive thirst, polyuria, polydipsia. Patient denies any swollen glands, patient denies easy bruising or easy bleeding. Patient denies any recent infections, allergies or URI. Patient "s visual fields have not changed significantly in recent time.    PHYSICAL EXAM: BP 125/72   Pulse 65   Temp (!) 97.4 F (36.3 C)   Resp 20   Ht _0  (1.6 m)   Wt 149 lb 12.8 oz (67.9 kg)   LMP 10/21/2017 Comment: Negative urine pregnancy test on 10/26/17  BMI 26.54 kg/m  Right breast is wide local excision which is healed well no dominant mass or nodularity is noted in either breast in 2 positions examined. No axillary or supraclavicular adenopathy is appreciated. Well-developed well-nourished patient in NAD. HEENT reveals PERLA, EOMI, discs not visualized.  Oral cavity is clear. No oral mucosal lesions are identified. Neck is clear without evidence of cervical or supraclavicular adenopathy. Lungs are clear to A&P. Cardiac examination is essentially unremarkable with regular rate and rhythm without murmur rub or thrill. Abdomen is benign with no organomegaly or masses noted. Motor sensory and DTR levels are equal and symmetric in the upper and lower extremities. Cranial nerves II through XII are grossly intact. Proprioception is  intact. No peripheral adenopathy or edema is identified. No motor or sensory levels are noted. Crude visual fields are within normal range.  LABORATORY DATA: pathology reports reviewed    RADIOLOGY RESULTS:mammogram and ultrasound reviewed   IMPRESSION: tage I (T1 CN 0 M0) ER/PR positive invasive mammary carcinoma the right breast status post wide local excision and sentinel node biopsy in 47 year old female  PLAN: patient did have ultrasound to see if you would be a candidate for accelerated partial breast radiation although seroma cavity was small and superficial. I would recommend evaluation by medical oncology and systemic chemotherapy prior to initiation of radiation therapy. I would treat her after chemotherapy with hypofractionated course of treatment over 4 weeks. Would also boost her scar another 1600 cGy based on the close margin. Risks and benefits of treatment including skin reaction fatigue alteration of blood counts possible inclusion of superficial lung all were discussed. I will reevaluate her and review all of my recommendations and treatment planning with her after completion of chemotherapy. Patient seems to comprehend my treatment plan well.  I would like to take this opportunity to thank you for allowing me to participate in the care of your patient.Noreene Filbert, MD

## 2017-11-11 NOTE — Telephone Encounter (Signed)
Carla with [employer] called to let us know that the mammoprint was denied, she states patient "doesn't have high clinical risk factors", size and nodes. She will fax letter. A Peer to Peer can be completed call [phone] option 3.

## 2017-11-16 ENCOUNTER — Encounter: Payer: Self-pay | Admitting: Internal Medicine

## 2017-11-16 ENCOUNTER — Inpatient Hospital Stay: Payer: Managed Care, Other (non HMO) | Attending: Internal Medicine | Admitting: Internal Medicine

## 2017-11-16 VITALS — BP 111/70 | HR 64 | Temp 97.1°F | Resp 16 | Wt 148.6 lb

## 2017-11-16 DIAGNOSIS — Z5111 Encounter for antineoplastic chemotherapy: Secondary | ICD-10-CM | POA: Diagnosis not present

## 2017-11-16 DIAGNOSIS — Z79899 Other long term (current) drug therapy: Secondary | ICD-10-CM | POA: Diagnosis not present

## 2017-11-16 DIAGNOSIS — C50411 Malignant neoplasm of upper-outer quadrant of right female breast: Secondary | ICD-10-CM | POA: Diagnosis not present

## 2017-11-16 DIAGNOSIS — Z7689 Persons encountering health services in other specified circumstances: Secondary | ICD-10-CM | POA: Diagnosis not present

## 2017-11-16 DIAGNOSIS — Z170 Estrogen receptor positive status [ER+]: Secondary | ICD-10-CM | POA: Insufficient documentation

## 2017-11-16 NOTE — Progress Notes (Signed)
[place] NOTE  Patient Care Team: Cletis Athens, MD as PCP - General (Internal Medicine)  CHIEF COMPLAINTS/PURPOSE OF CONSULTATION:    #  Oncology History   # May 2019- RIGHT BREAST CA s/p Lumpec & SLNBx [pT1c pN0 (sn); G-1;  margins clear; ER/PR > 90%; Her 2 neu- FISH-NEG. [place] [Dr.Byrnett]- HIGH RISK  # July 29th TC x4  DIAGNOSIS: BREAST CA   STAGE:  I/mammaprint-H       ;GOALS: cure  CURRENT/MOST RECENT THERAPY: TC x4      Malignant neoplasm of upper-outer quadrant of right breast in female, estrogen receptor positive ([place])   11/03/2017 Initial Diagnosis    Malignant neoplasm of upper-outer quadrant of right breast in female, estrogen receptor positive ([place])      12/06/2017 -  Chemotherapy    The patient had palonosetron (ALOXI) injection 0.25 mg, 0.25 mg, Intravenous,  Once, 0 of 4 cycles pegfilgrastim-cbqv (UDENYCA) injection 6 mg, 6 mg, Subcutaneous, Once, 0 of 4 cycles cyclophosphamide (CYTOXAN) 1,040 mg in sodium chloride 0.9 % 250 mL chemo infusion, 600 mg/m2, Intravenous,  Once, 0 of 4 cycles DOCEtaxel (TAXOTERE) 130 mg in sodium chloride 0.9 % 250 mL chemo infusion, 75 mg/m2, Intravenous,  Once, 0 of 4 cycles  for chemotherapy treatment.         HISTORY OF PRESENTING ILLNESS:  Crystal Branch 47 y.o.  female with no significant past medical history felt a lump in the right breast a month or so ago that led to mammogram and ultrasound which led to a biopsy.  Biopsies were positive for malignancy; which further led to lumpectomy with sentinel node biopsy.  Patient had stage pT1c tumor lymph negative.  Interestingly MammaPrint came back high risk.  Patient is here to discuss chemotherapy options.  Patient denies any new lumps or bumps.  Recovering from surgery.  No nausea no vomiting.  Review of Systems  Constitutional: Negative for chills, diaphoresis, fever, malaise/fatigue and weight loss.  HENT: Negative for nosebleeds and sore  throat.   Eyes: Negative for double vision.  Respiratory: Negative for cough, hemoptysis, sputum production, shortness of breath and wheezing.   Cardiovascular: Negative for chest pain, palpitations, orthopnea and leg swelling.  Gastrointestinal: Negative for abdominal pain, blood in stool, constipation, diarrhea, heartburn, melena, nausea and vomiting.  Genitourinary: Negative for dysuria, frequency and urgency.  Musculoskeletal: Negative for back pain and joint pain.  Skin: Negative.  Negative for itching and rash.  Neurological: Negative for dizziness, tingling, focal weakness, weakness and headaches.  Endo/Heme/Allergies: Does not bruise/bleed easily.  Psychiatric/Behavioral: Negative for depression. The patient is not nervous/anxious and does not have insomnia.      MEDICAL HISTORY:  Past Medical History:  Diagnosis Date  . 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   Marland Kitchen Dysrhythmia     SURGICAL HISTORY: Past Surgical History:  Procedure Laterality Date  . 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 BREAST LUMPECTOMY Right 10/26/2017  . BREAST LUMPECTOMY WITH SENTINEL LYMPH NODE BIOPSY Right 10/26/2017   Procedure: BREAST LUMPECTOMY WITH SENTINEL LYMPH NODE BX;  Surgeon: Robert Bellow, MD;  Location: [facility] ORS;  Service: General;  Laterality: Right;  . TONSILLECTOMY  2015    SOCIAL HISTORY: no children; works for [employer]; [place]; lives with dog.  No smoking no alcohol. Social History   Socioeconomic History  .  Marital status: Single    Spouse name: Not on file  . Number of children: Not on file  . Years of education: Not on file  . Highest education level: Not on file  Occupational History  . Not on file  Social Needs  . Financial resource strain: Not on file  . Food insecurity:    Worry: Not on file    Inability:  Not on file  . Transportation needs:    Medical: Not on file    Non-medical: Not on file  Tobacco Use  . Smoking status: Never Smoker  . Smokeless tobacco: Never Used  Substance and Sexual Activity  . Alcohol use: Never    Frequency: Never  . Drug use: Never  . Sexual activity: Not on file  Lifestyle  . Physical activity:    Days per week: Not on file    Minutes per session: Not on file  . Stress: Not on file  Relationships  . Social connections:    Talks on phone: Not on file    Gets together: Not on file    Attends religious service: Not on file    Active member of club or organization: Not on file    Attends meetings of clubs or organizations: Not on file    Relationship status: Not on file  . Intimate partner violence:    Fear of current or ex partner: Not on file    Emotionally abused: Not on file    Physically abused: Not on file    Forced sexual activity: Not on file  Other Topics Concern  . Not on file  Social History Narrative  . Not on file    FAMILY HISTORY: Family History  Problem Relation Age of Onset  . Breast cancer Maternal Aunt 60  . Breast cancer Maternal Grandmother 43    ALLERGIES:  has No Known Allergies.  MEDICATIONS:  Current Outpatient Medications  Medication Sig Dispense Refill  . Misc Natural Products (OSTEO BI-FLEX JOINT SHIELD PO) Take 1 capsule by mouth 2 (two) times daily.     . Multiple Vitamin (MULTIVITAMIN WITH MINERALS) TABS tablet Take 1 tablet by mouth daily.    . Probiotic Product ([place]) CAPS Take 1 capsule by mouth daily with supper.     Marland Kitchen acetaminophen (TYLENOL) 325 MG tablet Take 325 mg by mouth every 6 (six) hours as needed (for pain).    Marland Kitchen dexamethasone (DECADRON) 4 MG tablet Take one pill AM & PM x 3 days; start the day prior to chemo. 60 tablet 0  . ondansetron (ZOFRAN) 8 MG tablet One pill every 8 hours as needed for nausea/vomitting. 40 tablet 1  . prochlorperazine (COMPAZINE) 10 MG tablet Take 1  tablet (10 mg total) by mouth every 6 (six) hours as needed for nausea or vomiting. 40 tablet 1   No current facility-administered medications for this visit.       Marland Kitchen  PHYSICAL EXAMINATION: ECOG PERFORMANCE STATUS: 0 - Asymptomatic  Vitals:   11/16/17 1459 11/16/17 1502  BP:  111/70  Pulse:  64  Resp: 16 16  Temp:  (!) 97.1 F (36.2 C)   Filed Weights   11/16/17 1459  Weight: 148 lb 9.6 oz (67.4 kg)    GENERAL: Well-nourished well-developed; Alert, no distress and comfortable.  She is alone. EYES: no pallor or icterus OROPHARYNX: no thrush or ulceration; NECK: supple; no lymph nodes felt. LYMPH:  no palpable lymphadenopathy in the axillary or inguinal regions LUNGS: Decreased  breath sounds auscultation bilaterally. No wheeze or crackles HEART/CVS: regular rate & rhythm and no murmurs; No lower extremity edema ABDOMEN:abdomen soft, non-tender and normal bowel sounds. No hepatomegaly or splenomegaly.  Musculoskeletal:no cyanosis of digits and no clubbing  PSYCH: alert & oriented x 3 with fluent speech NEURO: no focal motor/sensory deficits SKIN:  no rashes or significant lesions  LABORATORY DATA:  I have reviewed the data as listed Lab Results  Component Value Date   WBC 3.7 10/13/2017   HGB 13.2 10/13/2017   HCT 38.7 10/13/2017   MCV 98 (H) 10/13/2017   PLT 221 10/13/2017   Recent Labs    10/13/17 0920  NA 143  K 5.0  CL 106  CO2 25  GLUCOSE 85  BUN 13  CREATININE 0.66  CALCIUM 9.8  GFRNONAA 104  GFRAA 120  PROT 6.5  ALBUMIN 4.6  AST 16  ALT 22  ALKPHOS 54  BILITOT 0.4    RADIOGRAPHIC STUDIES: I have personally reviewed the radiological images as listed and agreed with the findings in the report. Nm Sentinel Node Injection  Result Date: 10/26/2017 CLINICAL DATA:  Right breast cancer. EXAM: NUCLEAR MEDICINE BREAST LYMPHOSCINTIGRAPHY TECHNIQUE: Intradermal injection of radiopharmaceutical was performed at the 12 o'clock, 3 o'clock, 6 o'clock, and 9  o'clock positions around the right nipple. The patient was then sent to the operating room where the sentinel node(s) were identified and removed by the surgeon. RADIOPHARMACEUTICALS:  Total of 1 mCi Millipore-filtered Technetium-49msulfur colloid, injected in four aliquots of 0.25 mCi each. IMPRESSION: Uncomplicated intradermal injection of a total of 1 mCi Technetium-922mulfur colloid for purposes of sentinel node identification. Electronically Signed   By: HeKathreen Devoid On: 10/26/2017 08:30   UsKoreareast Complete Uni Right Inc Axilla  Result Date: 11/03/2017 Ultrasound examination of the wide excision site shows a small seroma measuring 0.7 x 3.2 x 3.6 cm.  This is at its close 0.62 cm to the overlying skin.  No adequate cavity for accelerated partial breast radiation.    ASSESSMENT & PLAN:   Malignant neoplasm of upper-outer quadrant of right breast in female, estrogen receptor positive ([place]#Stage I ER PR positive HER-2 negative; high risk mamma print.  No role for any adjuvant radiation  #Long discussion with the patient regarding discrepancy of clinical risk and genomic high risk.  As the patient is young motivated; I would recommend systemic chemotherapy even though this is a clinically low risk tumor.  #I would recommend TC chemotherapy x4. Discussed the potential side effects including but not limited to-increasing fatigue, nausea vomiting, diarrhea, hair loss, sores in the mouth, increase risk of infection and also neuropathy.   # Genetics counselling-pending/Dr. ByBary Castilla Growth factor-Udenyca would be given as prophylaxis for chemotherapy-induced neutropenia to prevent febrile neutropenias. Discuss potential side effect- myalgias/arthralgias-will recommend Claritin for 4 days.   # Recommend chemotherapy education; hold off for placement.  We will send antiemetics; steroids to medication.  Discussed with Dr. ByBary Castilla# July 29th TC; chemo; cbc/cmp; MD. discussed above plan with  the patient in detail.  She agrees.  Thank you Dr.Byrnett for allowing me to participate in the care of your pleasant patient. Please do not hesitate to contact me with questions or concerns in the interim.  # 60 minutes face-to-face with the patient discussing the above plan of care; more than 50% of time spent on prognosis/ natural history; counseling and coordination.    All questions were answered. The patient knows to  call the clinic with any problems, questions or concerns.       Cammie Sickle, MD 11/19/2017 7:17 PM

## 2017-11-16 NOTE — Assessment & Plan Note (Addendum)
#  Stage I ER PR positive HER-2 negative; high risk mamma print.  No role for any adjuvant radiation  #Long discussion with the patient regarding discrepancy of clinical risk and genomic high risk.  As the patient is young motivated; I would recommend systemic chemotherapy even though this is a clinically low risk tumor.  #I would recommend TC chemotherapy x4. Discussed the potential side effects including but not limited to-increasing fatigue, nausea vomiting, diarrhea, hair loss, sores in the mouth, increase risk of infection and also neuropathy.   # Genetics counselling-pending/Dr. Bary Castilla.  Growth factor-Udenyca would be given as prophylaxis for chemotherapy-induced neutropenia to prevent febrile neutropenias. Discuss potential side effect- myalgias/arthralgias-will recommend Claritin for 4 days.   # Recommend chemotherapy education; hold off for placement.  We will send antiemetics; steroids to medication.  Discussed with Dr. Bary Castilla  # July 29th TC; chemo; cbc/cmp; MD. discussed above plan with the patient in detail.  She agrees.  Thank you Dr.Byrnett for allowing me to participate in the care of your pleasant patient. Please do not hesitate to contact me with questions or concerns in the interim.  # 60 minutes face-to-face with the patient discussing the above plan of care; more than 50% of time spent on prognosis/ natural history; counseling and coordination.

## 2017-11-17 ENCOUNTER — Other Ambulatory Visit: Payer: Self-pay | Admitting: *Deleted

## 2017-11-17 NOTE — Telephone Encounter (Signed)
Received approval letters from [facility] for testing.

## 2017-11-18 ENCOUNTER — Telehealth: Payer: Self-pay | Admitting: *Deleted

## 2017-11-18 NOTE — Telephone Encounter (Signed)
No further testing at this time per Dr Bary Castilla. Aware BRCA genetic testing negative, pt pleased.

## 2017-11-19 ENCOUNTER — Telehealth: Payer: Self-pay | Admitting: Internal Medicine

## 2017-11-19 MED ORDER — DEXAMETHASONE 4 MG PO TABS: ORAL_TABLET | ORAL | 0 refills | Status: DC

## 2017-11-19 MED ORDER — PROCHLORPERAZINE MALEATE 10 MG PO TABS: 10.0000 mg | ORAL_TABLET | Freq: Four times a day (QID) | ORAL | 1 refills | Status: DC | PRN

## 2017-11-19 MED ORDER — ONDANSETRON HCL 8 MG PO TABS: ORAL_TABLET | ORAL | 1 refills | Status: DC

## 2017-11-19 NOTE — Progress Notes (Signed)
START ON PATHWAY REGIMEN - Breast     A cycle is every 21 days:     Docetaxel      Cyclophosphamide   **Always confirm dose/schedule in your pharmacy ordering system**  Patient Characteristics: Postoperative without Neoadjuvant Therapy (Pathologic Staging), Invasive Disease, Adjuvant Therapy, HER2 Negative/Unknown/Equivocal, ER Positive, Node Negative, pT1a-c, pN0/N29m or pT2 or Higher, pN0, MammaPrint(R), High Genomic Risk Therapeutic Status: Postoperative without Neoadjuvant Therapy (Pathologic Staging) AJCC Grade: G1 AJCC N Category: pN0 AJCC M Category: cM0 ER Status: Positive (+) AJCC 8 Stage Grouping: IA HER2 Status: Negative (-) Oncotype Dx Recurrence Score: Ordered Other Genomic Test AJCC T Category: pT1c PR Status: Positive (+) Has this patient completed genomic testing<= Yes - MammaPrint(R) MammaPrint(R) Score: High Genomic Risk Intent of Therapy: Curative Intent, Discussed with Patient

## 2017-11-19 NOTE — Telephone Encounter (Signed)
Sheena/Anne-I would appreciate if you can talk to patient; introduce yourself.  Please also inform the patient that I have sent prescriptions for Zofran Compazine; and also dexamethasone-to be started the day before chemotherapy [chemo planned on July 29th].   collette- please also schedule the pt for udenyca on 7/30. Thx

## 2017-11-20 NOTE — Telephone Encounter (Signed)
I called the patient to inform her that medications are available, and when to start dexamethasone.  I reminded her of Navigation service.  Sheena worked with her prior to her first visit.  Confirmed all of her appointments.

## 2017-11-20 NOTE — Progress Notes (Signed)
Called the patient to inform her that medications are available, and when to start dexamethasone.  I reminded her of Navigation service.  Tanya Nones RN worked with her prior to her first visit.  Confirmed all of patient's upcoming appointments.

## 2017-11-24 ENCOUNTER — Ambulatory Visit (INDEPENDENT_AMBULATORY_CARE_PROVIDER_SITE_OTHER): Payer: Managed Care, Other (non HMO) | Admitting: General Surgery

## 2017-11-24 ENCOUNTER — Encounter: Payer: Self-pay | Admitting: General Surgery

## 2017-11-24 ENCOUNTER — Telehealth: Payer: Self-pay | Admitting: Internal Medicine

## 2017-11-24 ENCOUNTER — Other Ambulatory Visit: Payer: Self-pay | Admitting: Internal Medicine

## 2017-11-24 VITALS — BP 98/60 | HR 62 | Resp 12 | Ht 63.0 in | Wt 149.0 lb

## 2017-11-24 DIAGNOSIS — C50411 Malignant neoplasm of upper-outer quadrant of right female breast: Secondary | ICD-10-CM

## 2017-11-24 DIAGNOSIS — Z170 Estrogen receptor positive status [ER+]: Secondary | ICD-10-CM

## 2017-11-24 NOTE — Telephone Encounter (Signed)
As per insurance- will discontinue udencyca;   I added onpro- on the day of chemo; please schedule-  Thx GB

## 2017-11-24 NOTE — Progress Notes (Signed)
Patient ID: Crystal Branch, female   DOB: [birth year], 47 y.o.   MRN: [id number]  Chief Complaint  Patient presents with  . Routine Post Op    HPI Crystal Branch is a 47 y.o. female Here for postoperative visit, right breast lumpectomy on 10-26-17. She states she is doing well. First treatment is on 12/14/2017.   HPI  Past Medical History:  Diagnosis Date  . 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  Mammoprint: High risk.   Marland Kitchen Dysrhythmia     Past Surgical History:  Procedure Laterality Date  . 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 BREAST LUMPECTOMY Right 10/26/2017  . BREAST LUMPECTOMY WITH SENTINEL LYMPH NODE BIOPSY Right 10/26/2017   Procedure: BREAST LUMPECTOMY WITH SENTINEL LYMPH NODE BX;  Surgeon: Robert Bellow, MD;  Location: [facility] ORS;  Service: General;  Laterality: Right;  . TONSILLECTOMY  2015    Family History  Problem Relation Age of Onset  . Breast cancer Maternal Aunt 60  . Breast cancer Maternal Grandmother 70    Social History Social History   Tobacco Use  . Smoking status: Never Smoker  . Smokeless tobacco: Never Used  Substance Use Topics  . Alcohol use: Never    Frequency: Never  . Drug use: Never    No Known Allergies  Current Outpatient Medications  Medication Sig Dispense Refill  . acetaminophen (TYLENOL) 325 MG tablet Take 325 mg by mouth every 6 (six) hours as needed (for pain).    Marland Kitchen dexamethasone (DECADRON) 4 MG tablet Take one pill AM & PM x 3 days; start the day prior to chemo. 60 tablet 0  . Misc Natural Products (OSTEO BI-FLEX JOINT SHIELD PO) Take 1 capsule by mouth 2 (two) times daily.     . Multiple Vitamin (MULTIVITAMIN WITH MINERALS) TABS tablet Take 1 tablet by mouth daily.    . ondansetron (ZOFRAN) 8 MG tablet One pill every 8 hours as needed for nausea/vomitting. 40 tablet 1  .  Probiotic Product ([place]) CAPS Take 1 capsule by mouth daily with supper.     . prochlorperazine (COMPAZINE) 10 MG tablet Take 1 tablet (10 mg total) by mouth every 6 (six) hours as needed for nausea or vomiting. 40 tablet 1   No current facility-administered medications for this visit.     Review of Systems Review of Systems  Constitutional: Negative.   Respiratory: Negative.   Cardiovascular: Negative.     Blood pressure 98/60, pulse 62, resp. rate 12, height 5\' 3"  (1.6 m), weight 149 lb (67.6 kg).  Physical Exam Physical Exam  Constitutional: She is oriented to person, place, and time. She appears well-developed and well-nourished.  Pulmonary/Chest:    Neurological: She is alert and oriented to person, place, and time.  Skin: Skin is warm and dry.    Data Reviewed Medical oncology notes of November 16, 2017. At present, no need for central venous access.  Assessment    Doing well post wide excision and sentinel node biopsy.    Plan  Patient to return in five months. The patient is aware to call back for any questions or concerns.   HPI, Physical Exam, Assessment and Plan have been scribed under the direction and in the presence of [name], MD.  Gaspar Cola, CMA  I have completed the exam and reviewed the above documentation  for accuracy and completeness.  I agree with the above.  Haematologist has been used and any errors in dictation or transcription are unintentional.  [name], M.D., F.A.C.S.   Crystal Branch 11/25/2017, 5:38 PM

## 2017-11-24 NOTE — Patient Instructions (Addendum)
Patient to return in five months. The patient is aware to call back for any questions or concerns.

## 2017-11-25 ENCOUNTER — Encounter: Payer: Self-pay | Admitting: *Deleted

## 2017-11-25 ENCOUNTER — Encounter: Payer: Self-pay | Admitting: General Surgery

## 2017-12-02 ENCOUNTER — Other Ambulatory Visit: Payer: Self-pay | Admitting: Internal Medicine

## 2017-12-03 LAB — SPECIMEN STATUS REPORT

## 2017-12-04 LAB — COMPREHENSIVE METABOLIC PANEL
A/G RATIO: 2.4 — AB (ref 1.2–2.2)
ALK PHOS: 54 IU/L (ref 39–117)
ALT: 22 IU/L (ref 0–32)
AST: 16 IU/L (ref 0–40)
Albumin: 4.6 g/dL (ref 3.5–5.5)
BILIRUBIN TOTAL: 0.4 mg/dL (ref 0.0–1.2)
BUN/Creatinine Ratio: 20 (ref 9–23)
BUN: 13 mg/dL (ref 6–24)
CHLORIDE: 106 mmol/L (ref 96–106)
CO2: 25 mmol/L (ref 20–29)
Calcium: 9.8 mg/dL (ref 8.7–10.2)
Creatinine, Ser: 0.66 mg/dL (ref 0.57–1.00)
GFR calc Af Amer: 120 mL/min/{1.73_m2} (ref >59)
GFR calc non Af Amer: 104 mL/min/{1.73_m2} (ref >59)
GLUCOSE: 85 mg/dL (ref 65–99)
Globulin, Total: 1.9 g/dL (ref 1.5–4.5)
POTASSIUM: 5 mmol/L (ref 3.5–5.2)
Sodium: 143 mmol/L (ref 134–144)
Total Protein: 6.5 g/dL (ref 6.0–8.5)

## 2017-12-04 LAB — CBC WITH DIFFERENTIAL/PLATELET
BASOS ABS: 0 10*3/uL (ref 0.0–0.2)
Basos: 0 %
EOS (ABSOLUTE): 0 10*3/uL (ref 0.0–0.4)
Eos: 1 %
Hematocrit: 38.7 % (ref 34.0–46.6)
Hemoglobin: 13.2 g/dL (ref 11.1–15.9)
Immature Grans (Abs): 0 10*3/uL (ref 0.0–0.1)
Immature Granulocytes: 0 %
LYMPHS ABS: 1.2 10*3/uL (ref 0.7–3.1)
Lymphs: 31 %
MCH: 33.3 pg — AB (ref 26.6–33.0)
MCHC: 34.1 g/dL (ref 31.5–35.7)
MCV: 98 fL — ABNORMAL HIGH (ref 79–97)
MONOS ABS: 0.3 10*3/uL (ref 0.1–0.9)
Monocytes: 9 %
NEUTROS ABS: 2.2 10*3/uL (ref 1.4–7.0)
Neutrophils: 59 %
PLATELETS: 221 10*3/uL (ref 150–450)
RBC: 3.96 x10E6/uL (ref 3.77–5.28)
RDW: 12.9 % (ref 12.3–15.4)
WBC: 3.7 10*3/uL (ref 3.4–10.8)

## 2017-12-04 LAB — COMPREHENSIVE BRCA1/2 ANALYSIS

## 2017-12-04 LAB — BRCASSURE COMPREHENSIVE TEST

## 2017-12-04 LAB — CANCER ANTIGEN 27.29: CAN 27.29: 7 U/mL (ref 0.0–38.6)

## 2017-12-04 LAB — CEA: CEA1: 1.3 ng/mL (ref 0.0–4.7)

## 2017-12-07 ENCOUNTER — Inpatient Hospital Stay: Payer: Managed Care, Other (non HMO)

## 2017-12-10 ENCOUNTER — Inpatient Hospital Stay: Payer: Managed Care, Other (non HMO)

## 2017-12-10 NOTE — Patient Instructions (Signed)

## 2017-12-14 ENCOUNTER — Encounter: Payer: Self-pay | Admitting: Internal Medicine

## 2017-12-14 ENCOUNTER — Inpatient Hospital Stay: Payer: Managed Care, Other (non HMO)

## 2017-12-14 ENCOUNTER — Other Ambulatory Visit: Payer: Self-pay

## 2017-12-14 ENCOUNTER — Inpatient Hospital Stay (HOSPITAL_BASED_OUTPATIENT_CLINIC_OR_DEPARTMENT_OTHER): Payer: Managed Care, Other (non HMO) | Admitting: Internal Medicine

## 2017-12-14 VITALS — BP 112/69 | HR 57 | Resp 20

## 2017-12-14 VITALS — BP 121/57 | HR 69 | Temp 97.6°F | Resp 20 | Ht 63.0 in | Wt 148.9 lb

## 2017-12-14 DIAGNOSIS — Z170 Estrogen receptor positive status [ER+]: Principal | ICD-10-CM

## 2017-12-14 DIAGNOSIS — C50411 Malignant neoplasm of upper-outer quadrant of right female breast: Secondary | ICD-10-CM

## 2017-12-14 DIAGNOSIS — Z79899 Other long term (current) drug therapy: Secondary | ICD-10-CM | POA: Diagnosis not present

## 2017-12-14 LAB — COMPREHENSIVE METABOLIC PANEL
ALT: 22 U/L (ref 0–44)
AST: 27 U/L (ref 15–41)
Albumin: 4.5 g/dL (ref 3.5–5.0)
Alkaline Phosphatase: 51 U/L (ref 38–126)
Anion gap: 9 (ref 5–15)
BUN: 18 mg/dL (ref 6–20)
CHLORIDE: 107 mmol/L (ref 98–111)
CO2: 23 mmol/L (ref 22–32)
CREATININE: 0.63 mg/dL (ref 0.44–1.00)
Calcium: 9.6 mg/dL (ref 8.9–10.3)
Glucose, Bld: 117 mg/dL — ABNORMAL HIGH (ref 70–99)
Potassium: 4.1 mmol/L (ref 3.5–5.1)
SODIUM: 139 mmol/L (ref 135–145)
Total Bilirubin: 0.7 mg/dL (ref 0.3–1.2)
Total Protein: 7.1 g/dL (ref 6.5–8.1)

## 2017-12-14 LAB — CBC WITH DIFFERENTIAL/PLATELET
Basophils Absolute: 0 10*3/uL (ref 0–0.1)
Basophils Relative: 0 %
EOS ABS: 0 10*3/uL (ref 0–0.7)
Eosinophils Relative: 0 %
HEMATOCRIT: 39.3 % (ref 35.0–47.0)
HEMOGLOBIN: 13.5 g/dL (ref 12.0–16.0)
LYMPHS ABS: 0.8 10*3/uL — AB (ref 1.0–3.6)
LYMPHS PCT: 9 %
MCH: 33.6 pg (ref 26.0–34.0)
MCHC: 34.4 g/dL (ref 32.0–36.0)
MCV: 97.8 fL (ref 80.0–100.0)
MONOS PCT: 4 %
Monocytes Absolute: 0.4 10*3/uL (ref 0.2–0.9)
NEUTROS PCT: 87 %
Neutro Abs: 7.8 10*3/uL — ABNORMAL HIGH (ref 1.4–6.5)
Platelets: 214 10*3/uL (ref 150–440)
RBC: 4.02 MIL/uL (ref 3.80–5.20)
RDW: 12.3 % (ref 11.5–14.5)
WBC: 8.9 10*3/uL (ref 3.6–11.0)

## 2017-12-14 MED ORDER — DOCETAXEL CHEMO INJECTION 160 MG/16ML
75.0000 mg/m2 | Freq: Once | INTRAVENOUS | Status: AC
  Administered 2017-12-14: 130 mg via INTRAVENOUS
  Filled 2017-12-14: qty 13

## 2017-12-14 MED ORDER — SODIUM CHLORIDE 0.9% FLUSH
10.0000 mL | INTRAVENOUS | Status: DC | PRN
  Filled 2017-12-14: qty 10

## 2017-12-14 MED ORDER — PEGFILGRASTIM 6 MG/0.6ML ~~LOC~~ PSKT
6.0000 mg | PREFILLED_SYRINGE | Freq: Once | SUBCUTANEOUS | Status: AC
  Administered 2017-12-14: 6 mg via SUBCUTANEOUS
  Filled 2017-12-14: qty 0.6

## 2017-12-14 MED ORDER — SODIUM CHLORIDE 0.9 % IV SOLN: 10.0000 mg | Freq: Once | INTRAVENOUS | Status: DC

## 2017-12-14 MED ORDER — SODIUM CHLORIDE 0.9 % IV SOLN
1000.0000 mg | Freq: Once | INTRAVENOUS | Status: AC
  Administered 2017-12-14: 1000 mg via INTRAVENOUS
  Filled 2017-12-14: qty 50

## 2017-12-14 MED ORDER — DEXAMETHASONE SODIUM PHOSPHATE 10 MG/ML IJ SOLN
10.0000 mg | Freq: Once | INTRAMUSCULAR | Status: AC
  Administered 2017-12-14: 10 mg via INTRAVENOUS
  Filled 2017-12-14: qty 1

## 2017-12-14 MED ORDER — SODIUM CHLORIDE 0.9 % IV SOLN
Freq: Once | INTRAVENOUS | Status: AC
  Administered 2017-12-14: 10:00:00 via INTRAVENOUS
  Filled 2017-12-14: qty 1000

## 2017-12-14 MED ORDER — HEPARIN SOD (PORK) LOCK FLUSH 100 UNIT/ML IV SOLN: 500.0000 [IU] | Freq: Once | INTRAVENOUS | Status: DC | PRN

## 2017-12-14 MED ORDER — PALONOSETRON HCL INJECTION 0.25 MG/5ML
0.2500 mg | Freq: Once | INTRAVENOUS | Status: AC
  Administered 2017-12-14: 0.25 mg via INTRAVENOUS
  Filled 2017-12-14: qty 5

## 2017-12-14 NOTE — Assessment & Plan Note (Addendum)
#  Stage I ER PR positive HER-2 negative; high risk mamma print. On adjuvant chemo with Tax-Cytoxan.STABLE  # Proceed with TC #1 today. Labs today reviewed;  acceptable for treatment today.   # Growth factor-onpro would be given as prophylaxis for chemotherapy-induced neutropenia to prevent febrile neutropenias. Discuss potential side effect- myalgias/arthralgias-will recommend Claritin for 4 days.   #Again reviewed the potential side effects including but not limited to drop in blood counts; hair loss neuropathy diarrhea.  # follow up in 3 weeks/labs;Tax-Cytoxan chemo/MD; onpro; 10 days-labs.  # 25 minutes face-to-face with the patient discussing the above plan of care; more than 50% of time spent on prognosis/ natural history; counseling and coordination.

## 2017-12-14 NOTE — Progress Notes (Signed)
[facility] OFFICE PROGRESS NOTE  Patient Care Team: Cletis Athens, MD as PCP - General (Internal Medicine)  Cancer Staging No matching staging information was found for the patient.   Oncology History   # May 2019- RIGHT BREAST CA s/p Lumpec & SLNBx [pT1c pN0 (sn); G-1;  margins clear; ER/PR > 90%; Her 2 neu- FISH-NEG. [place] [Dr.Byrnett]- HIGH RISK  # July 29th TC x4  DIAGNOSIS: BREAST CA   STAGE:  I/mammaprint-H       ;GOALS: cure  CURRENT/MOST RECENT THERAPY: TC x4      Malignant neoplasm of upper-outer quadrant of right breast in female, estrogen receptor positive ([place])   11/03/2017 Initial Diagnosis    Malignant neoplasm of upper-outer quadrant of right breast in female, estrogen receptor positive ([place])      12/06/2017 -  Chemotherapy    The patient had palonosetron (ALOXI) injection 0.25 mg, 0.25 mg, Intravenous,  Once, 1 of 4 cycles Administration: 0.25 mg (12/14/2017) pegfilgrastim (NEULASTA ONPRO KIT) injection 6 mg, 6 mg, Subcutaneous, Once, 1 of 4 cycles Administration: 6 mg (12/14/2017) cyclophosphamide (CYTOXAN) 1,000 mg in sodium chloride 0.9 % 250 mL chemo infusion, 1,040 mg, Intravenous,  Once, 1 of 4 cycles Administration: 1,000 mg (12/14/2017) DOCEtaxel (TAXOTERE) 130 mg in sodium chloride 0.9 % 250 mL chemo infusion, 75 mg/m2 = 130 mg, Intravenous,  Once, 1 of 4 cycles Administration: 130 mg (12/14/2017)  for chemotherapy treatment.          INTERVAL HISTORY:  Crystal Branch 47 y.o.  female pleasant patient above history of breast cancer ER PR positive HER-2/neu negative stage I high risk mamma print is here for follow-up.  Patient denies any unusual aches and pains.  Appetite is good.  No fevers or chills.  Review of Systems  Constitutional: Negative for chills, diaphoresis, fever, malaise/fatigue and weight loss.  HENT: Negative for nosebleeds and sore throat.   Eyes: Negative for double vision.  Respiratory: Negative for cough,  hemoptysis, sputum production, shortness of breath and wheezing.   Cardiovascular: Negative for chest pain, palpitations, orthopnea and leg swelling.  Gastrointestinal: Negative for abdominal pain, blood in stool, constipation, diarrhea, heartburn, melena, nausea and vomiting.  Genitourinary: Negative for dysuria, frequency and urgency.  Musculoskeletal: Negative for back pain and joint pain.  Skin: Negative.  Negative for itching and rash.  Neurological: Negative for dizziness, tingling, focal weakness, weakness and headaches.  Endo/Heme/Allergies: Does not bruise/bleed easily.  Psychiatric/Behavioral: Negative for depression. The patient is not nervous/anxious and does not have insomnia.       PAST MEDICAL HISTORY :  Past Medical History:  Diagnosis Date  . 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  Mammoprint: High risk.   Marland Kitchen Dysrhythmia     PAST SURGICAL HISTORY :   Past Surgical History:  Procedure Laterality Date  . 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 BREAST LUMPECTOMY Right 10/26/2017  . BREAST LUMPECTOMY WITH SENTINEL LYMPH NODE BIOPSY Right 10/26/2017   Procedure: BREAST LUMPECTOMY WITH SENTINEL LYMPH NODE BX;  Surgeon: Robert Bellow, MD;  Location: [facility] ORS;  Service: General;  Laterality: Right;  . TONSILLECTOMY  2015    FAMILY HISTORY :   Family History  Problem Relation Age of Onset  . Breast cancer Maternal Aunt 60  . Breast cancer Maternal Grandmother 15    SOCIAL HISTORY:  Social History   Tobacco Use  . Smoking status: Never Smoker  . Smokeless tobacco: Never Used  Substance Use Topics  . Alcohol use: Never    Frequency: Never  . Drug use: Never    ALLERGIES:  has No Known Allergies.  MEDICATIONS:  Current Outpatient Medications  Medication Sig Dispense Refill  . acetaminophen (TYLENOL) 325  MG tablet Take 325 mg by mouth every 6 (six) hours as needed (for pain).    Marland Kitchen dexamethasone (DECADRON) 4 MG tablet Take one pill AM & PM x 3 days; start the day prior to chemo. 60 tablet 0  . Misc Natural Products (OSTEO BI-FLEX JOINT SHIELD PO) Take 1 capsule by mouth 2 (two) times daily.     . Multiple Vitamin (MULTIVITAMIN WITH MINERALS) TABS tablet Take 1 tablet by mouth daily.    . ondansetron (ZOFRAN) 8 MG tablet One pill every 8 hours as needed for nausea/vomitting. (Patient not taking: Reported on 12/14/2017) 40 tablet 1  . Probiotic Product ([place]) CAPS Take 1 capsule by mouth daily with supper.     . prochlorperazine (COMPAZINE) 10 MG tablet Take 1 tablet (10 mg total) by mouth every 6 (six) hours as needed for nausea or vomiting. (Patient not taking: Reported on 12/14/2017) 40 tablet 1   No current facility-administered medications for this visit.     PHYSICAL EXAMINATION: ECOG PERFORMANCE STATUS: 0 - Asymptomatic  BP (!) 121/57 (BP Location: Left Arm, Patient Position: Sitting)   Pulse 69   Temp 97.6 F (36.4 C) (Tympanic)   Resp 20   Ht 5' 3"  (1.6 m)   Wt 148 lb 14.4 oz (67.5 kg)   BMI 26.38 kg/m   Filed Weights   12/14/17 0825  Weight: 148 lb 14.4 oz (67.5 kg)    GENERAL: Well-nourished well-developed; Alert, no distress and comfortable.  Accompanied by friend. EYES: no pallor or icterus OROPHARYNX: no thrush or ulceration; NECK: supple; no lymph nodes felt. LYMPH:  no palpable lymphadenopathy in the axillary or inguinal regions LUNGS: Decreased breath sounds auscultation bilaterally. No wheeze or crackles HEART/CVS: regular rate & rhythm and no murmurs; No lower extremity edema ABDOMEN:abdomen soft, non-tender and normal bowel sounds. No hepatomegaly or splenomegaly.  Musculoskeletal:no cyanosis of digits and no clubbing  PSYCH: alert & oriented x 3 with fluent speech NEURO: no focal motor/sensory deficits SKIN:  no rashes or significant  lesions    LABORATORY DATA:  I have reviewed the data as listed    Component Value Date/Time   NA 139 12/14/2017 0803   NA 143 10/13/2017 0920   NA 142 06/19/2011 2227   K 4.1 12/14/2017 0803   K 3.8 06/19/2011 2227   CL 107 12/14/2017 0803   CL 107 06/19/2011 2227   CO2 23 12/14/2017 0803   CO2 21 06/19/2011 2227   GLUCOSE 117 (H) 12/14/2017 0803   GLUCOSE 103 (H) 06/19/2011 2227   BUN 18 12/14/2017 0803   BUN 13 10/13/2017 0920   BUN 10 06/19/2011 2227   CREATININE 0.63 12/14/2017 0803   CREATININE 0.75 06/19/2011 2227   CALCIUM 9.6 12/14/2017 0803   CALCIUM 9.5 06/19/2011 2227   PROT 7.1 12/14/2017 0803   PROT 6.5 10/13/2017 0920   ALBUMIN 4.5 12/14/2017 0803   ALBUMIN 4.6 10/13/2017 0920   AST 27 12/14/2017 0803   ALT 22 12/14/2017 0803   ALKPHOS 51 12/14/2017 0803   BILITOT 0.7 12/14/2017 0803   BILITOT 0.4 10/13/2017 0920   GFRNONAA >60  12/14/2017 0803   GFRNONAA >60 06/19/2011 2227   GFRAA >60 12/14/2017 0803   GFRAA >60 06/19/2011 2227    No results found for: SPEP, UPEP  Lab Results  Component Value Date   WBC 8.9 12/14/2017   NEUTROABS 7.8 (H) 12/14/2017   HGB 13.5 12/14/2017   HCT 39.3 12/14/2017   MCV 97.8 12/14/2017   PLT 214 12/14/2017      Chemistry      Component Value Date/Time   NA 139 12/14/2017 0803   NA 143 10/13/2017 0920   NA 142 06/19/2011 2227   K 4.1 12/14/2017 0803   K 3.8 06/19/2011 2227   CL 107 12/14/2017 0803   CL 107 06/19/2011 2227   CO2 23 12/14/2017 0803   CO2 21 06/19/2011 2227   BUN 18 12/14/2017 0803   BUN 13 10/13/2017 0920   BUN 10 06/19/2011 2227   CREATININE 0.63 12/14/2017 0803   CREATININE 0.75 06/19/2011 2227      Component Value Date/Time   CALCIUM 9.6 12/14/2017 0803   CALCIUM 9.5 06/19/2011 2227   ALKPHOS 51 12/14/2017 0803   AST 27 12/14/2017 0803   ALT 22 12/14/2017 0803   BILITOT 0.7 12/14/2017 0803   BILITOT 0.4 10/13/2017 0920       RADIOGRAPHIC STUDIES: I have personally reviewed  the radiological images as listed and agreed with the findings in the report. No results found.   ASSESSMENT & PLAN:  Malignant neoplasm of upper-outer quadrant of right breast in female, estrogen receptor positive (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 On adjuvant chemo with Tax-Cytoxan.STABLE  # Proceed with TC #1 today. Labs today reviewed;  acceptable for treatment today.   # Growth factor-onpro would be given as prophylaxis for chemotherapy-induced neutropenia to prevent febrile neutropenias. Discuss potential side effect- myalgias/arthralgias-will recommend Claritin for 4 days.   #Again reviewed the potential side effects including but not limited to drop in blood counts; hair loss neuropathy diarrhea.  # follow up in 3 weeks/labs;Tax-Cytoxan chemo/MD; onpro; 10 days-labs.  # 25 minutes face-to-face with the patient discussing the above plan of care; more than 50% of time spent on prognosis/ natural history; counseling and coordination.    Orders Placed This Encounter  Procedures  . CBC with Differential/Platelet    Standing Status:   Future    Standing Expiration Date:   12/15/2018  . Basic metabolic panel    Standing Status:   Future    Standing Expiration Date:   12/15/2018  . CBC with Differential/Platelet    Standing Status:   Future    Standing Expiration Date:   12/15/2018  . Comprehensive metabolic panel    Standing Status:   Future    Standing Expiration Date:   12/15/2018   All questions were answered. The patient knows to call the clinic with any problems, questions or concerns.      Cammie Sickle, MD 12/15/2017 9:37 PM

## 2017-12-15 ENCOUNTER — Ambulatory Visit: Payer: Managed Care, Other (non HMO)

## 2017-12-18 ENCOUNTER — Encounter: Payer: Self-pay | Admitting: General Surgery

## 2017-12-18 LAB — VISTASEQ HR/MR BREAST CANCER: Result Summary: NEGATIVE

## 2017-12-24 ENCOUNTER — Inpatient Hospital Stay: Payer: Managed Care, Other (non HMO)

## 2017-12-24 ENCOUNTER — Telehealth: Payer: Self-pay | Admitting: *Deleted

## 2017-12-24 ENCOUNTER — Inpatient Hospital Stay: Payer: Managed Care, Other (non HMO) | Attending: Internal Medicine

## 2017-12-24 ENCOUNTER — Encounter: Payer: Self-pay | Admitting: Nurse Practitioner

## 2017-12-24 ENCOUNTER — Inpatient Hospital Stay (HOSPITAL_BASED_OUTPATIENT_CLINIC_OR_DEPARTMENT_OTHER): Payer: Managed Care, Other (non HMO) | Admitting: Nurse Practitioner

## 2017-12-24 VITALS — BP 128/81 | HR 66 | Temp 98.5°F | Resp 16 | Wt 152.0 lb

## 2017-12-24 DIAGNOSIS — Z9221 Personal history of antineoplastic chemotherapy: Secondary | ICD-10-CM | POA: Diagnosis not present

## 2017-12-24 DIAGNOSIS — H9209 Otalgia, unspecified ear: Secondary | ICD-10-CM

## 2017-12-24 DIAGNOSIS — Z5189 Encounter for other specified aftercare: Secondary | ICD-10-CM

## 2017-12-24 DIAGNOSIS — Z5111 Encounter for antineoplastic chemotherapy: Secondary | ICD-10-CM | POA: Diagnosis not present

## 2017-12-24 DIAGNOSIS — Z170 Estrogen receptor positive status [ER+]: Secondary | ICD-10-CM | POA: Insufficient documentation

## 2017-12-24 DIAGNOSIS — C50411 Malignant neoplasm of upper-outer quadrant of right female breast: Secondary | ICD-10-CM | POA: Diagnosis present

## 2017-12-24 DIAGNOSIS — R519 Headache, unspecified: Secondary | ICD-10-CM

## 2017-12-24 DIAGNOSIS — R51 Headache: Secondary | ICD-10-CM

## 2017-12-24 DIAGNOSIS — J0110 Acute frontal sinusitis, unspecified: Secondary | ICD-10-CM | POA: Insufficient documentation

## 2017-12-24 LAB — COMPREHENSIVE METABOLIC PANEL
ALT: 39 U/L (ref 0–44)
ANION GAP: 9 (ref 5–15)
AST: 45 U/L — AB (ref 15–41)
Albumin: 4.2 g/dL (ref 3.5–5.0)
Alkaline Phosphatase: 111 U/L (ref 38–126)
BILIRUBIN TOTAL: 0.5 mg/dL (ref 0.3–1.2)
BUN: 12 mg/dL (ref 6–20)
CO2: 27 mmol/L (ref 22–32)
Calcium: 9.3 mg/dL (ref 8.9–10.3)
Chloride: 106 mmol/L (ref 98–111)
Creatinine, Ser: 0.68 mg/dL (ref 0.44–1.00)
GFR calc Af Amer: >60 mL/min (ref >60)
Glucose, Bld: 83 mg/dL (ref 70–99)
POTASSIUM: 4 mmol/L (ref 3.5–5.1)
Sodium: 142 mmol/L (ref 135–145)
TOTAL PROTEIN: 6.7 g/dL (ref 6.5–8.1)

## 2017-12-24 LAB — CBC WITH DIFFERENTIAL/PLATELET
BASOS ABS: 0.2 10*3/uL — AB (ref 0–0.1)
Basophils Relative: 1 %
Eosinophils Absolute: 0.1 10*3/uL (ref 0–0.7)
Eosinophils Relative: 0 %
HCT: 38.3 % (ref 35.0–47.0)
Hemoglobin: 12.7 g/dL (ref 12.0–16.0)
LYMPHS ABS: 2.4 10*3/uL (ref 1.0–3.6)
LYMPHS PCT: 7 %
MCH: 33.1 pg (ref 26.0–34.0)
MCHC: 33.3 g/dL (ref 32.0–36.0)
MCV: 99.5 fL (ref 80.0–100.0)
Monocytes Absolute: 1.1 10*3/uL — ABNORMAL HIGH (ref 0.2–0.9)
Monocytes Relative: 3 %
NEUTROS ABS: 32.1 10*3/uL — AB (ref 1.4–6.5)
NEUTROS PCT: 89 %
PLATELETS: 188 10*3/uL (ref 150–440)
RBC: 3.85 MIL/uL (ref 3.80–5.20)
RDW: 12.9 % (ref 11.5–14.5)
WBC: 35.8 10*3/uL — AB (ref 3.6–11.0)

## 2017-12-24 MED ORDER — AMOXICILLIN 500 MG PO CAPS: 500.0000 mg | ORAL_CAPSULE | Freq: Three times a day (TID) | ORAL | 0 refills | Status: AC

## 2017-12-24 MED ORDER — LORATADINE 10 MG PO TABS: 10.0000 mg | ORAL_TABLET | Freq: Every day | ORAL | Status: DC

## 2017-12-24 NOTE — Telephone Encounter (Signed)
Patient called reporting that she  Has had an earache for a few days and now she has a headaches too. This morning she blew her nose and there was blood in her mucous. Denies fever and allergies. I discussed with NP Lorretta Harp and patient scheduled to have labs and see NP

## 2017-12-24 NOTE — Progress Notes (Signed)
Patient is being seen as an acute add on for headache/facial pain and pain around her ear, all on the left side. 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 She was awakened this morning at 4:00am with terrible pain in her left forehead, and she took some tylenol. She states that the pain has eased up some, but is still causing discomfort, ans has moved around to her face and ear. She is a newly diagnosed breast cancer patient, who has received 1 cycle of chemotherapy with Cytoxan and Taxotere on 12/14/17.

## 2017-12-24 NOTE — Progress Notes (Signed)
Symptom Management [place]  Telephone:([phone] [phone]  Patient Care Team: Cletis Athens, MD as PCP - General (Internal Medicine)   Name of the patient: Crystal Branch  [id number]  Feb 10, 1971   Date of visit: 12/24/17  Diagnosis- Breast Cancer  Chief complaint/ Reason for visit- Ear Pain & Headache  Heme/Onc history:  Oncology History   # May 2019- RIGHT BREAST CA s/p Lumpec & SLNBx [pT1c pN0 (sn); G-1;  margins clear; ER/PR > 90%; Her 2 neu- FISH-NEG. [place] [Dr.Byrnett]- HIGH RISK  # July 29th TC x4  DIAGNOSIS: BREAST CA   STAGE:  I/mammaprint-H       ;GOALS: cure  CURRENT/MOST RECENT THERAPY: TC x4      Malignant neoplasm of upper-outer quadrant of right breast in female, estrogen receptor positive ([place])   11/03/2017 Initial Diagnosis    Malignant neoplasm of upper-outer quadrant of right breast in female, estrogen receptor positive ([place])    12/06/2017 -  Chemotherapy    The patient had palonosetron (ALOXI) injection 0.25 mg, 0.25 mg, Intravenous,  Once, 1 of 4 cycles Administration: 0.25 mg (12/14/2017) pegfilgrastim (NEULASTA ONPRO KIT) injection 6 mg, 6 mg, Subcutaneous, Once, 1 of 4 cycles Administration: 6 mg (12/14/2017) cyclophosphamide (CYTOXAN) 1,000 mg in sodium chloride 0.9 % 250 mL chemo infusion, 1,040 mg, Intravenous,  Once, 1 of 4 cycles Administration: 1,000 mg (12/14/2017) DOCEtaxel (TAXOTERE) 130 mg in sodium chloride 0.9 % 250 mL chemo infusion, 75 mg/m2 = 130 mg, Intravenous,  Once, 1 of 4 cycles Administration: 130 mg (12/14/2017)  for chemotherapy treatment.      Interval history- Crystal Branch 80, is 47 year old female, with PMH significant for past tonsillectomy in 2015 due to recurrent tonsillitis, presents to symptom management for complaints of left ear pain.  Symptoms started 3 days ago and have rapidly worsened since that time.  She complains of left ear pain and pressure, general achiness,  congestion, facial pain worse on left side, and headache.  She states that pain of her left face radiates to left ear and woke her from sleep early this morning.  She has been drinking plenty of fluids, using Tylenol Sinus, and Claritin (for Neulasta administration) with worsening of symptoms.  She denies any fever, chills, night sweats.  She states that she has a history of chronic ear infections/sinus infections/tonsil infections but that she has not had trouble since she had her tonsils removed in 2015.  Denies recent antibiotics.   ECOG FS:1 - Symptomatic but completely ambulatory  Review of systems- Review of Systems  Constitutional: Positive for malaise/fatigue. Negative for chills, fever and weight loss.  HENT: Positive for congestion, ear pain, nosebleeds (spotting ), sinus pain and sore throat. Negative for ear discharge, hearing loss and tinnitus.   Eyes: Positive for pain (left eye (pressure behind eye)). Negative for blurred vision, double vision, photophobia, discharge and redness.  Respiratory: Negative.  Negative for cough, sputum production and shortness of breath.   Cardiovascular: Negative for chest pain, palpitations, orthopnea, claudication and leg swelling.  Gastrointestinal: Negative for abdominal pain, blood in stool, constipation, diarrhea, heartburn, nausea and vomiting.  Genitourinary: Negative.   Musculoskeletal: Negative.   Skin: Negative.   Neurological: Negative for dizziness, tingling, weakness and headaches.  Endo/Heme/Allergies: Negative.  Negative for environmental allergies.  Psychiatric/Behavioral: Negative.     Current treatment- Cytoxan and Taxotere w/ Neulasta Support (12/14/17)  No Known Allergies  Past Medical History:  Diagnosis Date  . 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  Mammoprint: High risk.   Marland Kitchen Dysrhythmia     Past Surgical History:  Procedure Laterality Date  .  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 BREAST LUMPECTOMY Right 10/26/2017  . BREAST LUMPECTOMY WITH SENTINEL LYMPH NODE BIOPSY Right 10/26/2017   Procedure: BREAST LUMPECTOMY WITH SENTINEL LYMPH NODE BX;  Surgeon: Robert Bellow, MD;  Location: [facility] ORS;  Service: General;  Laterality: Right;  . TONSILLECTOMY  2015    Social History   Socioeconomic History  . Marital status: Single    Spouse name: Not on file  . Number of children: Not on file  . Years of education: Not on file  . Highest education level: Not on file  Occupational History  . Not on file  Social Needs  . Financial resource strain: Not on file  . Food insecurity:    Worry: Not on file    Inability: Not on file  . Transportation needs:    Medical: Not on file    Non-medical: Not on file  Tobacco Use  . Smoking status: Never Smoker  . Smokeless tobacco: Never Used  Substance and Sexual Activity  . Alcohol use: Never    Frequency: Never  . Drug use: Never  . Sexual activity: Not on file  Lifestyle  . Physical activity:    Days per week: Not on file    Minutes per session: Not on file  . Stress: Not on file  Relationships  . Social connections:    Talks on phone: Not on file    Gets together: Not on file    Attends religious service: Not on file    Active member of club or organization: Not on file    Attends meetings of clubs or organizations: Not on file    Relationship status: Not on file  . Intimate partner violence:    Fear of current or ex partner: Not on file    Emotionally abused: Not on file    Physically abused: Not on file    Forced sexual activity: Not on file  Other Topics Concern  . Not on file  Social History Narrative  . Not on file    Family History  Problem Relation Age of Onset  . Breast cancer Maternal Aunt 60  . Breast cancer Maternal Grandmother 50     Current Outpatient Medications:  .   acetaminophen (TYLENOL) 325 MG tablet, Take 325 mg by mouth every 6 (six) hours as needed (for pain)., Disp: , Rfl:  .  dexamethasone (DECADRON) 4 MG tablet, Take one pill AM & PM x 3 days; start the day prior to chemo., Disp: 60 tablet, Rfl: 0 .  Misc Natural Products (OSTEO BI-FLEX JOINT SHIELD PO), Take 1 capsule by mouth 2 (two) times daily. , Disp: , Rfl:  .  Multiple Vitamin (MULTIVITAMIN WITH MINERALS) TABS tablet, Take 1 tablet by mouth daily., Disp: , Rfl:  .  Probiotic Product ([employer]ON HEALTH) CAPS, Take 1 capsule by mouth daily with supper. , Disp: , Rfl:  .  amoxicillin (AMOXIL) 500 MG capsule, Take 1 capsule (500 mg total) by mouth 3 (three) times daily for 10 days., Disp: 30 capsule, Rfl: 0 .  loratadine (CLARITIN) 10 MG tablet, Take 1 tablet (10 mg total) by mouth daily., Disp: , Rfl:  .  ondansetron (ZOFRAN) 8  MG tablet, One pill every 8 hours as needed for nausea/vomitting. (Patient not taking: Reported on 12/14/2017), Disp: 40 tablet, Rfl: 1 .  prochlorperazine (COMPAZINE) 10 MG tablet, Take 1 tablet (10 mg total) by mouth every 6 (six) hours as needed for nausea or vomiting. (Patient not taking: Reported on 12/14/2017), Disp: 40 tablet, Rfl: 1  Physical exam:  Vitals:   12/24/17 0933  BP: 128/81  Pulse: 66  Resp: 16  Temp: 98.5 F (36.9 C)  TempSrc: Tympanic  Weight: 152 lb (68.9 kg)   Physical Exam  Constitutional: She is oriented to person, place, and time. She appears well-developed and well-nourished.  HENT:  Head: Atraumatic.  Right Ear: Hearing, tympanic membrane, external ear and ear canal normal. No tenderness. No mastoid tenderness.  Left Ear: Hearing and tympanic membrane normal. There is tenderness. There is mastoid tenderness.  Nose: Mucosal edema present. No epistaxis. Left sinus exhibits maxillary sinus tenderness and frontal sinus tenderness.  Mouth/Throat: Oropharynx is clear and moist. No oral lesions. No oropharyngeal exudate.  Eyes: Conjunctivae  are normal. No scleral icterus. Left pupil is not round (per patient- chronic). Pupils are unequal.  Neck: Normal range of motion.  Cardiovascular: Normal rate, regular rhythm and normal heart sounds.  Pulmonary/Chest: Effort normal and breath sounds normal.  Abdominal: Soft. Bowel sounds are normal. She exhibits no distension. There is no tenderness.  Musculoskeletal: She exhibits no edema.  Neurological: She is alert and oriented to person, place, and time.  Skin: Skin is warm and dry.  Psychiatric: She has a normal mood and affect.     CMP Latest Ref Rng & Units 12/24/2017  Glucose 70 - 99 mg/dL 83  BUN 6 - 20 mg/dL 12  Creatinine 0.44 - 1.00 mg/dL 0.68  Sodium 135 - 145 mmol/L 142  Potassium 3.5 - 5.1 mmol/L 4.0  Chloride 98 - 111 mmol/L 106  CO2 22 - 32 mmol/L 27  Calcium 8.9 - 10.3 mg/dL 9.3  Total Protein 6.5 - 8.1 g/dL 6.7  Total Bilirubin 0.3 - 1.2 mg/dL 0.5  Alkaline Phos 38 - 126 U/L 111  AST 15 - 41 U/L 45(H)  ALT 0 - 44 U/L 39   CBC Latest Ref Rng & Units 12/24/2017  WBC 3.6 - 11.0 K/uL 35.8(H)  Hemoglobin 12.0 - 16.0 g/dL 12.7  Hematocrit 35.0 - 47.0 % 38.3  Platelets 150 - 440 K/uL 188    No images are attached to the encounter.  No results found.  Assessment and plan- Patient is a 47 y.o. female with right breast cancer, who presents to symptom management clinic for sinusitis.  1.  Stage I ER PR positive HER-2/neu negative right breast cancer.  MammaPrint high risk.  Currently on s/p cycle 1 of adjuvant chemo with Taxotere and Cytoxan on 12/14/2017.  Received Neulasta support to prevent febrile neutropenia's in setting of chemotherapy.  Labs today drawn and reviewed.  Elevated WBC consistent with recent Neulasta.   2.  Acute sinusitis-non-recurrent but prior history of recurrent infections > 4 years ago.  Given symptoms, including mastoid tenderness, and recent chemotherapy, will start amoxicillin.  Discussed Flonase use at home to help open up sinuses and improve  outflow and help resolve the infection.  We discussed the pathophysiology of how edema from viruses can cause sinus obstruction and secondary bacterial infections and ultimately ventilating the sinuses helps to improve and overcome infections.  If she experiences dryness associated with Flonase she can use saline nasal spray as needed.  Also  discussed continued use of Claritin.  Patient advised to notify the clinic if there is no improvement in symptoms or if symptoms worsen in next 3-4 days.   rtc as scheduled on 01/04/2018 for labs and reevaluation with Dr. Rogue Bussing, and consideration of cycle 2 of Taxotere and Cytoxan.   Visit Diagnosis 1. Malignant neoplasm of upper-outer quadrant of right breast in female, estrogen receptor positive ([place])   2. Acute non-recurrent frontal sinusitis     Patient expressed understanding and was in agreement with this plan. She also understands that She can call clinic at any time with any questions, concerns, or complaints.   Thank you for allowing me to participate in the care of this very pleasant patient.   Beckey Rutter, DNP, AGNP-C [place] at [facility] [phone] (work cell) [phone] (office) 12/24/17 4:14 PM

## 2018-01-04 ENCOUNTER — Encounter: Payer: Self-pay | Admitting: *Deleted

## 2018-01-04 ENCOUNTER — Inpatient Hospital Stay (HOSPITAL_BASED_OUTPATIENT_CLINIC_OR_DEPARTMENT_OTHER): Payer: Managed Care, Other (non HMO) | Admitting: Internal Medicine

## 2018-01-04 ENCOUNTER — Encounter: Payer: Self-pay | Admitting: Internal Medicine

## 2018-01-04 ENCOUNTER — Inpatient Hospital Stay: Payer: Managed Care, Other (non HMO)

## 2018-01-04 VITALS — BP 98/58 | HR 58 | Temp 97.0°F | Resp 16 | Wt 151.2 lb

## 2018-01-04 DIAGNOSIS — C50411 Malignant neoplasm of upper-outer quadrant of right female breast: Secondary | ICD-10-CM

## 2018-01-04 DIAGNOSIS — Z170 Estrogen receptor positive status [ER+]: Secondary | ICD-10-CM | POA: Diagnosis not present

## 2018-01-04 DIAGNOSIS — Z9221 Personal history of antineoplastic chemotherapy: Secondary | ICD-10-CM | POA: Diagnosis not present

## 2018-01-04 DIAGNOSIS — Z5111 Encounter for antineoplastic chemotherapy: Secondary | ICD-10-CM | POA: Diagnosis not present

## 2018-01-04 LAB — CBC WITH DIFFERENTIAL/PLATELET
BASOS ABS: 0 10*3/uL (ref 0–0.1)
Basophils Relative: 0 %
Eosinophils Absolute: 0 10*3/uL (ref 0–0.7)
Eosinophils Relative: 0 %
HEMATOCRIT: 35.8 % (ref 35.0–47.0)
HEMOGLOBIN: 12.3 g/dL (ref 12.0–16.0)
Lymphocytes Relative: 10 %
Lymphs Abs: 0.8 10*3/uL — ABNORMAL LOW (ref 1.0–3.6)
MCH: 33.7 pg (ref 26.0–34.0)
MCHC: 34.5 g/dL (ref 32.0–36.0)
MCV: 97.8 fL (ref 80.0–100.0)
Monocytes Absolute: 0.5 10*3/uL (ref 0.2–0.9)
Monocytes Relative: 6 %
NEUTROS ABS: 7.4 10*3/uL — AB (ref 1.4–6.5)
NEUTROS PCT: 84 %
Platelets: 300 10*3/uL (ref 150–440)
RBC: 3.66 MIL/uL — AB (ref 3.80–5.20)
RDW: 12.5 % (ref 11.5–14.5)
WBC: 8.7 10*3/uL (ref 3.6–11.0)

## 2018-01-04 LAB — COMPREHENSIVE METABOLIC PANEL
ALBUMIN: 4.5 g/dL (ref 3.5–5.0)
ALT: 29 U/L (ref 0–44)
ANION GAP: 9 (ref 5–15)
AST: 26 U/L (ref 15–41)
Alkaline Phosphatase: 71 U/L (ref 38–126)
BUN: 15 mg/dL (ref 6–20)
CO2: 24 mmol/L (ref 22–32)
Calcium: 9.5 mg/dL (ref 8.9–10.3)
Chloride: 109 mmol/L (ref 98–111)
Creatinine, Ser: 0.54 mg/dL (ref 0.44–1.00)
GLUCOSE: 119 mg/dL — AB (ref 70–99)
POTASSIUM: 4.2 mmol/L (ref 3.5–5.1)
Sodium: 142 mmol/L (ref 135–145)
Total Bilirubin: 0.6 mg/dL (ref 0.3–1.2)
Total Protein: 6.9 g/dL (ref 6.5–8.1)

## 2018-01-04 MED ORDER — SODIUM CHLORIDE 0.9 % IV SOLN
Freq: Once | INTRAVENOUS | Status: AC
  Administered 2018-01-04: 11:00:00 via INTRAVENOUS
  Filled 2018-01-04: qty 1000

## 2018-01-04 MED ORDER — SODIUM CHLORIDE 0.9 % IV SOLN
75.0000 mg/m2 | Freq: Once | INTRAVENOUS | Status: AC
  Administered 2018-01-04: 130 mg via INTRAVENOUS
  Filled 2018-01-04: qty 13

## 2018-01-04 MED ORDER — PALONOSETRON HCL INJECTION 0.25 MG/5ML
0.2500 mg | Freq: Once | INTRAVENOUS | Status: AC
  Administered 2018-01-04: 0.25 mg via INTRAVENOUS
  Filled 2018-01-04: qty 5

## 2018-01-04 MED ORDER — DEXAMETHASONE SODIUM PHOSPHATE 10 MG/ML IJ SOLN
10.0000 mg | Freq: Once | INTRAMUSCULAR | Status: AC
  Administered 2018-01-04: 10 mg via INTRAVENOUS
  Filled 2018-01-04: qty 1

## 2018-01-04 MED ORDER — SODIUM CHLORIDE 0.9 % IV SOLN
1000.0000 mg | Freq: Once | INTRAVENOUS | Status: AC
  Administered 2018-01-04: 1000 mg via INTRAVENOUS
  Filled 2018-01-04: qty 50

## 2018-01-04 MED ORDER — SODIUM CHLORIDE 0.9 % IV SOLN: 10.0000 mg | Freq: Once | INTRAVENOUS | Status: DC

## 2018-01-04 MED ORDER — PEGFILGRASTIM 6 MG/0.6ML ~~LOC~~ PSKT
6.0000 mg | PREFILLED_SYRINGE | Freq: Once | SUBCUTANEOUS | Status: AC
  Administered 2018-01-04: 6 mg via SUBCUTANEOUS
  Filled 2018-01-04: qty 0.6

## 2018-01-04 NOTE — Progress Notes (Signed)
[facility] OFFICE PROGRESS NOTE  Patient Care Team: Cletis Athens, MD as PCP - General (Internal Medicine)  Cancer Staging No matching staging information was found for the patient.   Oncology History   # May 2019- RIGHT BREAST CA s/p Lumpec & SLNBx [pT1c pN0 (sn); G-1;  margins clear; ER/PR > 90%; Her 2 neu- FISH-NEG. [place] [Dr.Byrnett]- HIGH RISK  # July 29th TC x4  GENETIC TESTING: [Dr.Byrnett] NEGATIVE FOR - ATM/ BRCA2/CHEK2/PTEN/TP53/BRCA1/CDH1/PALB2/ STK11   DIAGNOSIS: BREAST CA   STAGE:  I/mammaprint-H       ;GOALS: cure  CURRENT/MOST RECENT THERAPY: TC x4      Malignant neoplasm of upper-outer quadrant of right breast in female, estrogen receptor positive ([place])   11/03/2017 Initial Diagnosis    Malignant neoplasm of upper-outer quadrant of right breast in female, estrogen receptor positive ([place])    12/06/2017 -  Chemotherapy    The patient had palonosetron (ALOXI) injection 0.25 mg, 0.25 mg, Intravenous,  Once, 2 of 4 cycles Administration: 0.25 mg (12/14/2017) pegfilgrastim (NEULASTA ONPRO KIT) injection 6 mg, 6 mg, Subcutaneous, Once, 2 of 4 cycles Administration: 6 mg (12/14/2017) cyclophosphamide (CYTOXAN) 1,000 mg in sodium chloride 0.9 % 250 mL chemo infusion, 1,040 mg, Intravenous,  Once, 2 of 4 cycles Administration: 1,000 mg (12/14/2017) DOCEtaxel (TAXOTERE) 130 mg in sodium chloride 0.9 % 250 mL chemo infusion, 75 mg/m2 = 130 mg, Intravenous,  Once, 2 of 4 cycles Administration: 130 mg (12/14/2017)  for chemotherapy treatment.        INTERVAL HISTORY:  Crystal Branch 47 y.o.  female pleasant patient above history of stage I breast cancer ER PR positive HER-2/neu negative high risk mamma print is currently status post cycle #1 of Taxotere Cytoxan chemotherapy.  Patient complains of hair loss.  Mild to moderate fatigue.  Mild diarrhea currently resolved.  No tingling and numbness.  No nausea vomiting.  No fevers or chills.  Review of  Systems  Constitutional: Positive for malaise/fatigue. Negative for chills, diaphoresis, fever and weight loss.  HENT: Negative for nosebleeds and sore throat.   Eyes: Negative for double vision.  Respiratory: Negative for cough, hemoptysis, sputum production, shortness of breath and wheezing.   Cardiovascular: Negative for chest pain, palpitations, orthopnea and leg swelling.  Gastrointestinal: Positive for diarrhea. Negative for abdominal pain, blood in stool, constipation, heartburn, melena, nausea and vomiting.  Genitourinary: Negative for dysuria, frequency and urgency.  Musculoskeletal: Negative for back pain and joint pain.  Skin: Negative.  Negative for itching and rash.  Neurological: Negative for dizziness, tingling, focal weakness, weakness and headaches.  Endo/Heme/Allergies: Does not bruise/bleed easily.  Psychiatric/Behavioral: Negative for depression. The patient is not nervous/anxious and does not have insomnia.       PAST MEDICAL HISTORY :  Past Medical History:  Diagnosis Date  . 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  Mammoprint: High risk.   Marland Kitchen Dysrhythmia     PAST SURGICAL HISTORY :   Past Surgical History:  Procedure Laterality Date  . 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 BREAST LUMPECTOMY Right 10/26/2017  . BREAST LUMPECTOMY WITH SENTINEL LYMPH NODE BIOPSY Right 10/26/2017   Procedure: BREAST LUMPECTOMY WITH SENTINEL LYMPH NODE BX;  Surgeon: Robert Bellow, MD;  Location: [facility] ORS;  Service: General;  Laterality: Right;  . TONSILLECTOMY  2015    FAMILY HISTORY :  Family History  Problem Relation Age of Onset  . Breast cancer Maternal Aunt 60  . Breast cancer Maternal Grandmother 35    SOCIAL HISTORY:   Social History   Tobacco Use  . Smoking status: Never Smoker  . Smokeless tobacco: Never  Used  Substance Use Topics  . Alcohol use: Never    Frequency: Never  . Drug use: Never    ALLERGIES:  has No Known Allergies.  MEDICATIONS:  Current Outpatient Medications  Medication Sig Dispense Refill  . acetaminophen (TYLENOL) 325 MG tablet Take 325 mg by mouth every 6 (six) hours as needed (for pain).    Marland Kitchen dexamethasone (DECADRON) 4 MG tablet Take one pill AM & PM x 3 days; start the day prior to chemo. 60 tablet 0  . loratadine (CLARITIN) 10 MG tablet Take 1 tablet (10 mg total) by mouth daily.    . Misc Natural Products (OSTEO BI-FLEX JOINT SHIELD PO) Take 1 capsule by mouth 2 (two) times daily.     . Multiple Vitamin (MULTIVITAMIN WITH MINERALS) TABS tablet Take 1 tablet by mouth daily.    . ondansetron (ZOFRAN) 8 MG tablet One pill every 8 hours as needed for nausea/vomitting. 40 tablet 1  . Probiotic Product ([place]) CAPS Take 1 capsule by mouth daily with supper.     . prochlorperazine (COMPAZINE) 10 MG tablet Take 1 tablet (10 mg total) by mouth every 6 (six) hours as needed for nausea or vomiting. 40 tablet 1   No current facility-administered medications for this visit.     PHYSICAL EXAMINATION: ECOG PERFORMANCE STATUS: 0 - Asymptomatic  BP (!) 98/58 (BP Location: Left Arm, Patient Position: Sitting)   Pulse (!) 58   Temp (!) 97 F (36.1 C) (Tympanic)   Resp 16   Wt 151 lb 3.2 oz (68.6 kg)   BMI 26.78 kg/m   Filed Weights   01/04/18 0838  Weight: 151 lb 3.2 oz (68.6 kg)    GENERAL: Well-nourished well-developed; Alert, no distress and comfortable.  Alone. EYES: no pallor or icterus OROPHARYNX: no thrush or ulceration; NECK: supple; no lymph nodes felt. LYMPH:  no palpable lymphadenopathy in the axillary or inguinal regions LUNGS: Decreased breath sounds auscultation bilaterally. No wheeze or crackles HEART/CVS: regular rate & rhythm and no murmurs; No lower extremity edema ABDOMEN:abdomen soft, non-tender and normal bowel sounds. No  hepatomegaly or splenomegaly.  Musculoskeletal:no cyanosis of digits and no clubbing  PSYCH: alert & oriented x 3 with fluent speech NEURO: no focal motor/sensory deficits SKIN:  no rashes or significant lesions    LABORATORY DATA:  I have reviewed the data as listed    Component Value Date/Time   NA 142 01/04/2018 0806   NA 143 10/13/2017 0920   NA 142 06/19/2011 2227   K 4.2 01/04/2018 0806   K 3.8 06/19/2011 2227   CL 109 01/04/2018 0806   CL 107 06/19/2011 2227   CO2 24 01/04/2018 0806   CO2 21 06/19/2011 2227   GLUCOSE 119 (H) 01/04/2018 0806   GLUCOSE 103 (H) 06/19/2011 2227   BUN 15 01/04/2018 0806   BUN 13 10/13/2017 0920   BUN 10 06/19/2011 2227   CREATININE 0.54 01/04/2018 0806   CREATININE 0.75 06/19/2011 2227   CALCIUM 9.5 01/04/2018 0806   CALCIUM 9.5 06/19/2011 2227   PROT 6.9 01/04/2018 0806   PROT 6.5 10/13/2017 0920   ALBUMIN 4.5 01/04/2018 0806   ALBUMIN 4.6 10/13/2017 0920   AST 26 01/04/2018 0806  ALT 29 01/04/2018 0806   ALKPHOS 71 01/04/2018 0806   BILITOT 0.6 01/04/2018 0806   BILITOT 0.4 10/13/2017 0920   GFRNONAA >60 01/04/2018 0806   GFRNONAA >60 06/19/2011 2227   GFRAA >60 01/04/2018 0806   GFRAA >60 06/19/2011 2227    No results found for: SPEP, UPEP  Lab Results  Component Value Date   WBC 8.7 01/04/2018   NEUTROABS 7.4 (H) 01/04/2018   HGB 12.3 01/04/2018   HCT 35.8 01/04/2018   MCV 97.8 01/04/2018   PLT 300 01/04/2018      Chemistry      Component Value Date/Time   NA 142 01/04/2018 0806   NA 143 10/13/2017 0920   NA 142 06/19/2011 2227   K 4.2 01/04/2018 0806   K 3.8 06/19/2011 2227   CL 109 01/04/2018 0806   CL 107 06/19/2011 2227   CO2 24 01/04/2018 0806   CO2 21 06/19/2011 2227   BUN 15 01/04/2018 0806   BUN 13 10/13/2017 0920   BUN 10 06/19/2011 2227   CREATININE 0.54 01/04/2018 0806   CREATININE 0.75 06/19/2011 2227      Component Value Date/Time   CALCIUM 9.5 01/04/2018 0806   CALCIUM 9.5 06/19/2011  2227   ALKPHOS 71 01/04/2018 0806   AST 26 01/04/2018 0806   ALT 29 01/04/2018 0806   BILITOT 0.6 01/04/2018 0806   BILITOT 0.4 10/13/2017 0920       RADIOGRAPHIC STUDIES: I have personally reviewed the radiological images as listed and agreed with the findings in the report. No results found.   ASSESSMENT & PLAN:  Malignant neoplasm of upper-outer quadrant of right breast in female, estrogen receptor positive ([place]) #Stage I ER PR positive HER-2 negative; HIGH risk mamma print. On adjuvant chemo with Tax-Cytoxan.stable  # Proceed with TC #2 today. Labs today reviewed;  acceptable for treatment today.   #Diarrhea grade 1-from Taxotere chemotherapy.  Current resolved.  #Fatigue grade 1-from Taxotere chemotherapy.  Stable  #Genetic testing negative for any deleterious mutation.  # follow up in 3 weeks/labs;Tax-Cytoxan chemo/MD; onpro; 10 days-labs.  # 25 minutes face-to-face with the patient discussing the above plan of care; more than 50% of time spent on prognosis/ natural history; counseling and coordination.    Orders Placed This Encounter  Procedures  . CBC with Differential/Platelet    Standing Status:   Future    Standing Expiration Date:   01/05/2019  . Comprehensive metabolic panel    Standing Status:   Future    Standing Expiration Date:   01/05/2019   All questions were answered. The patient knows to call the clinic with any problems, questions or concerns.      Cammie Sickle, MD 01/04/2018 1:49 PM

## 2018-01-04 NOTE — Assessment & Plan Note (Addendum)
#  Stage I ER PR positive HER-2 negative; HIGH risk mamma print. On adjuvant chemo with Tax-Cytoxan.stable  # Proceed with TC #2 today. Labs today reviewed;  acceptable for treatment today.   #Diarrhea grade 1-from Taxotere chemotherapy.  Current resolved.  #Fatigue grade 1-from Taxotere chemotherapy.  Stable  #Genetic testing negative for any deleterious mutation.  # follow up in 3 weeks/labs;Tax-Cytoxan chemo/MD; onpro; 10 days-labs.  # 25 minutes face-to-face with the patient discussing the above plan of care; more than 50% of time spent on prognosis/ natural history; counseling and coordination.

## 2018-01-14 ENCOUNTER — Inpatient Hospital Stay: Payer: Managed Care, Other (non HMO)

## 2018-01-14 DIAGNOSIS — Z170 Estrogen receptor positive status [ER+]: Principal | ICD-10-CM

## 2018-01-14 DIAGNOSIS — Z5111 Encounter for antineoplastic chemotherapy: Secondary | ICD-10-CM | POA: Diagnosis not present

## 2018-01-14 DIAGNOSIS — C50411 Malignant neoplasm of upper-outer quadrant of right female breast: Secondary | ICD-10-CM

## 2018-01-14 LAB — BASIC METABOLIC PANEL
Anion gap: 6 (ref 5–15)
BUN: 10 mg/dL (ref 6–20)
CHLORIDE: 108 mmol/L (ref 98–111)
CO2: 28 mmol/L (ref 22–32)
CREATININE: 0.59 mg/dL (ref 0.44–1.00)
Calcium: 9.2 mg/dL (ref 8.9–10.3)
GFR calc non Af Amer: >60 mL/min (ref >60)
Glucose, Bld: 80 mg/dL (ref 70–99)
Potassium: 3.9 mmol/L (ref 3.5–5.1)
Sodium: 142 mmol/L (ref 135–145)

## 2018-01-14 LAB — CBC WITH DIFFERENTIAL/PLATELET
Basophils Absolute: 0.1 10*3/uL (ref 0–0.1)
Basophils Relative: 0 %
EOS ABS: 0.1 10*3/uL (ref 0–0.7)
Eosinophils Relative: 0 %
HEMATOCRIT: 36.8 % (ref 35.0–47.0)
HEMOGLOBIN: 12.1 g/dL (ref 12.0–16.0)
LYMPHS ABS: 2.3 10*3/uL (ref 1.0–3.6)
Lymphocytes Relative: 5 %
MCH: 32.8 pg (ref 26.0–34.0)
MCHC: 32.8 g/dL (ref 32.0–36.0)
MCV: 100 fL (ref 80.0–100.0)
Monocytes Absolute: 1.3 10*3/uL — ABNORMAL HIGH (ref 0.2–0.9)
Monocytes Relative: 3 %
NEUTROS PCT: 92 %
Neutro Abs: 44.1 10*3/uL — ABNORMAL HIGH (ref 1.4–6.5)
Platelets: 186 10*3/uL (ref 150–440)
RBC: 3.68 MIL/uL — AB (ref 3.80–5.20)
RDW: 13.5 % (ref 11.5–14.5)
WBC: 47.9 10*3/uL — AB (ref 3.6–11.0)

## 2018-01-25 ENCOUNTER — Inpatient Hospital Stay: Payer: Managed Care, Other (non HMO) | Attending: Internal Medicine

## 2018-01-25 ENCOUNTER — Encounter: Payer: Self-pay | Admitting: Internal Medicine

## 2018-01-25 ENCOUNTER — Inpatient Hospital Stay: Payer: Managed Care, Other (non HMO)

## 2018-01-25 ENCOUNTER — Other Ambulatory Visit: Payer: Self-pay

## 2018-01-25 ENCOUNTER — Inpatient Hospital Stay (HOSPITAL_BASED_OUTPATIENT_CLINIC_OR_DEPARTMENT_OTHER): Payer: Managed Care, Other (non HMO) | Admitting: Internal Medicine

## 2018-01-25 VITALS — BP 110/69 | HR 62 | Temp 97.0°F | Resp 18 | Ht 63.0 in | Wt 152.0 lb

## 2018-01-25 DIAGNOSIS — C50411 Malignant neoplasm of upper-outer quadrant of right female breast: Secondary | ICD-10-CM

## 2018-01-25 DIAGNOSIS — Z5111 Encounter for antineoplastic chemotherapy: Secondary | ICD-10-CM | POA: Diagnosis not present

## 2018-01-25 DIAGNOSIS — G629 Polyneuropathy, unspecified: Secondary | ICD-10-CM | POA: Diagnosis not present

## 2018-01-25 DIAGNOSIS — Z170 Estrogen receptor positive status [ER+]: Secondary | ICD-10-CM

## 2018-01-25 DIAGNOSIS — Z7689 Persons encountering health services in other specified circumstances: Secondary | ICD-10-CM | POA: Insufficient documentation

## 2018-01-25 DIAGNOSIS — Z79899 Other long term (current) drug therapy: Secondary | ICD-10-CM | POA: Diagnosis not present

## 2018-01-25 LAB — CBC WITH DIFFERENTIAL/PLATELET
BASOS PCT: 0 %
Basophils Absolute: 0 10*3/uL (ref 0–0.1)
EOS ABS: 0 10*3/uL (ref 0–0.7)
Eosinophils Relative: 0 %
HEMATOCRIT: 33.9 % — AB (ref 35.0–47.0)
HEMOGLOBIN: 11.7 g/dL — AB (ref 12.0–16.0)
Lymphocytes Relative: 7 %
Lymphs Abs: 0.8 10*3/uL — ABNORMAL LOW (ref 1.0–3.6)
MCH: 33.1 pg (ref 26.0–34.0)
MCHC: 34.4 g/dL (ref 32.0–36.0)
MCV: 96.3 fL (ref 80.0–100.0)
MONOS PCT: 5 %
Monocytes Absolute: 0.6 10*3/uL (ref 0.2–0.9)
NEUTROS PCT: 88 %
Neutro Abs: 9.9 10*3/uL — ABNORMAL HIGH (ref 1.4–6.5)
PLATELETS: 253 10*3/uL (ref 150–440)
RBC: 3.52 MIL/uL — ABNORMAL LOW (ref 3.80–5.20)
RDW: 13.3 % (ref 11.5–14.5)
WBC: 11.3 10*3/uL — ABNORMAL HIGH (ref 3.6–11.0)

## 2018-01-25 LAB — COMPREHENSIVE METABOLIC PANEL
ALBUMIN: 4.2 g/dL (ref 3.5–5.0)
ALK PHOS: 67 U/L (ref 38–126)
ALT: 34 U/L (ref 0–44)
AST: 28 U/L (ref 15–41)
Anion gap: 6 (ref 5–15)
BUN: 14 mg/dL (ref 6–20)
CALCIUM: 9.3 mg/dL (ref 8.9–10.3)
CHLORIDE: 109 mmol/L (ref 98–111)
CO2: 26 mmol/L (ref 22–32)
Creatinine, Ser: 0.5 mg/dL (ref 0.44–1.00)
GFR calc non Af Amer: >60 mL/min (ref >60)
GLUCOSE: 115 mg/dL — AB (ref 70–99)
Potassium: 3.9 mmol/L (ref 3.5–5.1)
SODIUM: 141 mmol/L (ref 135–145)
Total Bilirubin: 0.7 mg/dL (ref 0.3–1.2)
Total Protein: 6.3 g/dL — ABNORMAL LOW (ref 6.5–8.1)

## 2018-01-25 MED ORDER — PEGFILGRASTIM 6 MG/0.6ML ~~LOC~~ PSKT
6.0000 mg | PREFILLED_SYRINGE | Freq: Once | SUBCUTANEOUS | Status: AC
  Administered 2018-01-25: 6 mg via SUBCUTANEOUS
  Filled 2018-01-25: qty 0.6

## 2018-01-25 MED ORDER — SODIUM CHLORIDE 0.9 % IV SOLN
Freq: Once | INTRAVENOUS | Status: AC
  Administered 2018-01-25: 10:00:00 via INTRAVENOUS
  Filled 2018-01-25: qty 250

## 2018-01-25 MED ORDER — PALONOSETRON HCL INJECTION 0.25 MG/5ML
0.2500 mg | Freq: Once | INTRAVENOUS | Status: AC
  Administered 2018-01-25: 0.25 mg via INTRAVENOUS
  Filled 2018-01-25: qty 5

## 2018-01-25 MED ORDER — SODIUM CHLORIDE 0.9 % IV SOLN
75.0000 mg/m2 | Freq: Once | INTRAVENOUS | Status: AC
  Administered 2018-01-25: 130 mg via INTRAVENOUS
  Filled 2018-01-25: qty 13

## 2018-01-25 MED ORDER — HEPARIN SOD (PORK) LOCK FLUSH 100 UNIT/ML IV SOLN: 500.0000 [IU] | Freq: Once | INTRAVENOUS | Status: DC

## 2018-01-25 MED ORDER — DEXAMETHASONE SODIUM PHOSPHATE 10 MG/ML IJ SOLN
10.0000 mg | Freq: Once | INTRAMUSCULAR | Status: AC
  Administered 2018-01-25: 10 mg via INTRAVENOUS
  Filled 2018-01-25: qty 1

## 2018-01-25 MED ORDER — SODIUM CHLORIDE 0.9 % IV SOLN
1000.0000 mg | Freq: Once | INTRAVENOUS | Status: AC
  Administered 2018-01-25: 1000 mg via INTRAVENOUS
  Filled 2018-01-25: qty 50

## 2018-01-25 MED ORDER — SODIUM CHLORIDE 0.9 % IV SOLN: 10.0000 mg | Freq: Once | INTRAVENOUS | Status: DC

## 2018-01-25 MED ORDER — SODIUM CHLORIDE 0.9% FLUSH
10.0000 mL | INTRAVENOUS | Status: DC | PRN
  Filled 2018-01-25: qty 10

## 2018-01-25 NOTE — Assessment & Plan Note (Addendum)
#  Stage I ER PR positive HER-2 negative; HIGH risk mamma print. On adjuvant chemo with Tax-Cytoxan. STABLE.  # Proceed with TC #3 today. Labs today reviewed;  acceptable for treatment today.   #Diarrhea grade 1-from Taxotere chemotherapy.  STABLE.   #Fatigue grade 1-from Taxotere chemotherapy.  STABLE.  # Peripheral neuropathy- G-1; monitor fr now.   # follow up in 3 weeks/labs;Tax-Cytoxan chemo/MD; onpro.

## 2018-01-25 NOTE — Progress Notes (Signed)
[place] OFFICE PROGRESS NOTE  Patient Care Team: Cletis Athens, MD as PCP - General (Internal Medicine)  Cancer Staging No matching staging information was found for the patient.   Oncology History   # May 2019- RIGHT BREAST CA s/p Lumpec & SLNBx [pT1c pN0 (sn); G-1;  margins clear; ER/PR > 90%; Her 2 neu- FISH-NEG. [place] [Dr.Byrnett]- HIGH RISK  # July 29th TC x4  GENETIC TESTING: [Dr.Byrnett] NEGATIVE FOR - ATM/ BRCA2/CHEK2/PTEN/TP53/BRCA1/CDH1/PALB2/ STK11   DIAGNOSIS: BREAST CA   STAGE:  I/mammaprint-H       ;GOALS: cure  CURRENT/MOST RECENT THERAPY: TC x4      Malignant neoplasm of upper-outer quadrant of right breast in female, estrogen receptor positive ([place])   11/03/2017 Initial Diagnosis    Malignant neoplasm of upper-outer quadrant of right breast in female, estrogen receptor positive ([place])    12/06/2017 -  Chemotherapy    The patient had palonosetron (ALOXI) injection 0.25 mg, 0.25 mg, Intravenous,  Once, 2 of 4 cycles Administration: 0.25 mg (12/14/2017), 0.25 mg (01/04/2018) pegfilgrastim (NEULASTA ONPRO KIT) injection 6 mg, 6 mg, Subcutaneous, Once, 2 of 4 cycles Administration: 6 mg (12/14/2017), 6 mg (01/04/2018) cyclophosphamide (CYTOXAN) 1,000 mg in sodium chloride 0.9 % 250 mL chemo infusion, 1,040 mg, Intravenous,  Once, 2 of 4 cycles Administration: 1,000 mg (12/14/2017), 1,000 mg (01/04/2018) DOCEtaxel (TAXOTERE) 130 mg in sodium chloride 0.9 % 250 mL chemo infusion, 75 mg/m2 = 130 mg, Intravenous,  Once, 2 of 4 cycles Administration: 130 mg (12/14/2017), 130 mg (01/04/2018)  for chemotherapy treatment.        INTERVAL HISTORY:  Crystal Branch 47 y.o.  female pleasant patient above history of stage I breast cancer ER PR positive HER-2/neu negative high risk mamma print is currently status post cycle #2 of Taxotere Cytoxan chemotherapy.  Patient complains of mild diarrhea the first week.  Currently resolved.  Patient also complains of  mild fatigue.  Continued hair loss.  Mild tingling and numbness in the hand and feet.  No nausea no vomiting.  No fevers or chills.  Review of Systems  Constitutional: Positive for malaise/fatigue. Negative for chills, diaphoresis, fever and weight loss.  HENT: Negative for nosebleeds and sore throat.   Eyes: Negative for double vision.  Respiratory: Negative for cough, hemoptysis, sputum production, shortness of breath and wheezing.   Cardiovascular: Negative for chest pain, palpitations, orthopnea and leg swelling.  Gastrointestinal: Positive for diarrhea. Negative for abdominal pain, blood in stool, constipation, heartburn, melena, nausea and vomiting.  Genitourinary: Negative for dysuria, frequency and urgency.  Musculoskeletal: Negative for back pain and joint pain.  Skin: Negative.  Negative for itching and rash.  Neurological: Positive for tingling. Negative for dizziness, focal weakness, weakness and headaches.  Endo/Heme/Allergies: Does not bruise/bleed easily.  Psychiatric/Behavioral: Negative for depression. The patient is not nervous/anxious and does not have insomnia.       PAST MEDICAL HISTORY :  Past Medical History:  Diagnosis Date  . Breast CA ([place]) 09/30/2017   INVASIVE MAMMARY CARCINOMA WITH MUCINOUS FEATURES/ ER/PR 90%; Her 2 neu: Negative.  Mammoprint: High risk.   Marland Kitchen Dysrhythmia     PAST SURGICAL HISTORY :   Past Surgical History:  Procedure Laterality Date  . 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 BREAST LUMPECTOMY Right 10/26/2017  . BREAST LUMPECTOMY WITH SENTINEL LYMPH NODE BIOPSY Right 10/26/2017   Procedure: BREAST LUMPECTOMY WITH SENTINEL LYMPH NODE BX;  Surgeon: Robert Bellow, MD;  Location: [facility] ORS;  Service: General;  Laterality: Right;  . TONSILLECTOMY  2015    FAMILY HISTORY :   Family History  Problem Relation Age of Onset  . Breast cancer Maternal Aunt 60  .  Breast cancer Maternal Grandmother 62    SOCIAL HISTORY:   Social History   Tobacco Use  . Smoking status: Never Smoker  . Smokeless tobacco: Never Used  Substance Use Topics  . Alcohol use: Never    Frequency: Never  . Drug use: Never    ALLERGIES:  has No Known Allergies.  MEDICATIONS:  Current Outpatient Medications  Medication Sig Dispense Refill  . acetaminophen (TYLENOL) 325 MG tablet Take 325 mg by mouth every 6 (six) hours as needed (for pain).    Marland Kitchen dexamethasone (DECADRON) 4 MG tablet Take one pill AM & PM x 3 days; start the day prior to chemo. 60 tablet 0  . loratadine (CLARITIN) 10 MG tablet Take 1 tablet (10 mg total) by mouth daily.    . Misc Natural Products (OSTEO BI-FLEX JOINT SHIELD PO) Take 1 capsule by mouth 2 (two) times daily.     . Multiple Vitamin (MULTIVITAMIN WITH MINERALS) TABS tablet Take 1 tablet by mouth daily.    . ondansetron (ZOFRAN) 8 MG tablet One pill every 8 hours as needed for nausea/vomitting. 40 tablet 1  . Probiotic Product ([employer]) CAPS Take 1 capsule by mouth daily with supper.     . prochlorperazine (COMPAZINE) 10 MG tablet Take 1 tablet (10 mg total) by mouth every 6 (six) hours as needed for nausea or vomiting. 40 tablet 1   No current facility-administered medications for this visit.     PHYSICAL EXAMINATION: ECOG PERFORMANCE STATUS: 0 - Asymptomatic  BP 110/69 (BP Location: Left Arm, Patient Position: Sitting)   Pulse 62   Temp (!) 97 F (36.1 C) (Tympanic)   Resp 18   Ht 5' 3" (1.6 m)   Wt 152 lb (68.9 kg)   BMI 26.93 kg/m   Filed Weights   01/25/18 0849  Weight: 152 lb (68.9 kg)   Physical Exam  Constitutional: She is oriented to person, place, and time and well-developed, well-nourished, and in no distress.  Alone.  Walking by self.  HENT:  Head: Normocephalic and atraumatic.  Mouth/Throat: Oropharynx is clear and moist. No oropharyngeal exudate.  Eyes: Pupils are equal, round, and reactive to  light.  Neck: Normal range of motion. Neck supple.  Cardiovascular: Normal rate and regular rhythm.  Pulmonary/Chest: No respiratory distress. She has no wheezes.  Abdominal: Soft. Bowel sounds are normal. She exhibits no distension and no mass. There is no tenderness. There is no rebound and no guarding.  Musculoskeletal: Normal range of motion. She exhibits no edema or tenderness.  Neurological: She is alert and oriented to person, place, and time.  Skin: Skin is warm.  Psychiatric: Affect normal.      LABORATORY DATA:  I have reviewed the data as listed    Component Value Date/Time   NA 141 01/25/2018 0815   NA 143 10/13/2017 0920   NA 142 06/19/2011 2227   K 3.9 01/25/2018 0815   K 3.8 06/19/2011 2227   CL 109 01/25/2018 0815   CL 107 06/19/2011 2227   CO2 26 01/25/2018 0815   CO2 21 06/19/2011 2227   GLUCOSE 115 (H) 01/25/2018 0815   GLUCOSE 103 (H) 06/19/2011 2227   BUN 14 01/25/2018 0815  BUN 13 10/13/2017 0920   BUN 10 06/19/2011 2227   CREATININE 0.50 01/25/2018 0815   CREATININE 0.75 06/19/2011 2227   CALCIUM 9.3 01/25/2018 0815   CALCIUM 9.5 06/19/2011 2227   PROT 6.3 (L) 01/25/2018 0815   PROT 6.5 10/13/2017 0920   ALBUMIN 4.2 01/25/2018 0815   ALBUMIN 4.6 10/13/2017 0920   AST 28 01/25/2018 0815   ALT 34 01/25/2018 0815   ALKPHOS 67 01/25/2018 0815   BILITOT 0.7 01/25/2018 0815   BILITOT 0.4 10/13/2017 0920   GFRNONAA >60 01/25/2018 0815   GFRNONAA >60 06/19/2011 2227   GFRAA >60 01/25/2018 0815   GFRAA >60 06/19/2011 2227    No results found for: SPEP, UPEP  Lab Results  Component Value Date   WBC 11.3 (H) 01/25/2018   NEUTROABS 9.9 (H) 01/25/2018   HGB 11.7 (L) 01/25/2018   HCT 33.9 (L) 01/25/2018   MCV 96.3 01/25/2018   PLT 253 01/25/2018      Chemistry      Component Value Date/Time   NA 141 01/25/2018 0815   NA 143 10/13/2017 0920   NA 142 06/19/2011 2227   K 3.9 01/25/2018 0815   K 3.8 06/19/2011 2227   CL 109 01/25/2018 0815    CL 107 06/19/2011 2227   CO2 26 01/25/2018 0815   CO2 21 06/19/2011 2227   BUN 14 01/25/2018 0815   BUN 13 10/13/2017 0920   BUN 10 06/19/2011 2227   CREATININE 0.50 01/25/2018 0815   CREATININE 0.75 06/19/2011 2227      Component Value Date/Time   CALCIUM 9.3 01/25/2018 0815   CALCIUM 9.5 06/19/2011 2227   ALKPHOS 67 01/25/2018 0815   AST 28 01/25/2018 0815   ALT 34 01/25/2018 0815   BILITOT 0.7 01/25/2018 0815   BILITOT 0.4 10/13/2017 0920       RADIOGRAPHIC STUDIES: I have personally reviewed the radiological images as listed and agreed with the findings in the report. No results found.   ASSESSMENT & PLAN:  Malignant neoplasm of upper-outer quadrant of right breast in female, estrogen receptor positive ([place]) #Stage I ER PR positive HER-2 negative; HIGH risk mamma print. On adjuvant chemo with Tax-Cytoxan. STABLE.  # Proceed with TC #3 today. Labs today reviewed;  acceptable for treatment today.   #Diarrhea grade 1-from Taxotere chemotherapy.  STABLE.   #Fatigue grade 1-from Taxotere chemotherapy.  STABLE.  # Peripheral neuropathy- G-1; monitor fr now.   # follow up in 3 weeks/labs;Tax-Cytoxan chemo/MD; onpro.     No orders of the defined types were placed in this encounter.  All questions were answered. The patient knows to call the clinic with any problems, questions or concerns.      Cammie Sickle, MD 01/25/2018 9:17 AM

## 2018-02-05 ENCOUNTER — Inpatient Hospital Stay: Payer: Managed Care, Other (non HMO)

## 2018-02-05 ENCOUNTER — Other Ambulatory Visit: Payer: Self-pay | Admitting: *Deleted

## 2018-02-05 ENCOUNTER — Other Ambulatory Visit: Payer: Self-pay

## 2018-02-05 DIAGNOSIS — C50411 Malignant neoplasm of upper-outer quadrant of right female breast: Secondary | ICD-10-CM

## 2018-02-05 DIAGNOSIS — Z170 Estrogen receptor positive status [ER+]: Principal | ICD-10-CM

## 2018-02-05 LAB — CBC WITH DIFFERENTIAL/PLATELET
BASOS ABS: 0.2 10*3/uL — AB (ref 0–0.1)
BASOS PCT: 1 %
EOS ABS: 0.1 10*3/uL (ref 0–0.7)
Eosinophils Relative: 0 %
HCT: 33.9 % — ABNORMAL LOW (ref 35.0–47.0)
HEMOGLOBIN: 11.2 g/dL — AB (ref 12.0–16.0)
Lymphocytes Relative: 5 %
Lymphs Abs: 1.7 10*3/uL (ref 1.0–3.6)
MCH: 33 pg (ref 26.0–34.0)
MCHC: 33 g/dL (ref 32.0–36.0)
MCV: 99.9 fL (ref 80.0–100.0)
MONO ABS: 1.1 10*3/uL — AB (ref 0.2–0.9)
Monocytes Relative: 3 %
NEUTROS ABS: 30.7 10*3/uL — AB (ref 1.4–6.5)
NEUTROS PCT: 91 %
Platelets: 174 10*3/uL (ref 150–440)
RBC: 3.39 MIL/uL — ABNORMAL LOW (ref 3.80–5.20)
RDW: 14.7 % — AB (ref 11.5–14.5)
WBC: 33.7 10*3/uL — ABNORMAL HIGH (ref 3.6–11.0)

## 2018-02-05 LAB — BASIC METABOLIC PANEL
ANION GAP: 8 (ref 5–15)
BUN: 10 mg/dL (ref 6–20)
CALCIUM: 9.2 mg/dL (ref 8.9–10.3)
CO2: 28 mmol/L (ref 22–32)
CREATININE: 0.54 mg/dL (ref 0.44–1.00)
Chloride: 109 mmol/L (ref 98–111)
Glucose, Bld: 75 mg/dL (ref 70–99)
Potassium: 4 mmol/L (ref 3.5–5.1)
Sodium: 145 mmol/L (ref 135–145)

## 2018-02-15 ENCOUNTER — Inpatient Hospital Stay: Payer: Managed Care, Other (non HMO)

## 2018-02-15 ENCOUNTER — Inpatient Hospital Stay (HOSPITAL_BASED_OUTPATIENT_CLINIC_OR_DEPARTMENT_OTHER): Payer: Managed Care, Other (non HMO) | Admitting: Internal Medicine

## 2018-02-15 VITALS — BP 102/65 | HR 64 | Temp 97.6°F | Resp 18 | Ht 63.0 in | Wt 152.5 lb

## 2018-02-15 VITALS — BP 96/57 | HR 66 | Resp 20

## 2018-02-15 DIAGNOSIS — C50411 Malignant neoplasm of upper-outer quadrant of right female breast: Secondary | ICD-10-CM

## 2018-02-15 DIAGNOSIS — G629 Polyneuropathy, unspecified: Secondary | ICD-10-CM

## 2018-02-15 DIAGNOSIS — Z79899 Other long term (current) drug therapy: Secondary | ICD-10-CM | POA: Diagnosis not present

## 2018-02-15 DIAGNOSIS — Z170 Estrogen receptor positive status [ER+]: Principal | ICD-10-CM

## 2018-02-15 LAB — CBC WITH DIFFERENTIAL/PLATELET
BASOS ABS: 0 10*3/uL (ref 0–0.1)
Basophils Relative: 0 %
Eosinophils Absolute: 0 10*3/uL (ref 0–0.7)
Eosinophils Relative: 0 %
HEMATOCRIT: 32 % — AB (ref 35.0–47.0)
Hemoglobin: 11 g/dL — ABNORMAL LOW (ref 12.0–16.0)
LYMPHS PCT: 7 %
Lymphs Abs: 0.7 10*3/uL — ABNORMAL LOW (ref 1.0–3.6)
MCH: 33.7 pg (ref 26.0–34.0)
MCHC: 34.4 g/dL (ref 32.0–36.0)
MCV: 97.9 fL (ref 80.0–100.0)
MONO ABS: 0.6 10*3/uL (ref 0.2–0.9)
MONOS PCT: 6 %
NEUTROS ABS: 9.6 10*3/uL — AB (ref 1.4–6.5)
Neutrophils Relative %: 87 %
Platelets: 256 10*3/uL (ref 150–440)
RBC: 3.27 MIL/uL — ABNORMAL LOW (ref 3.80–5.20)
RDW: 15.2 % — AB (ref 11.5–14.5)
WBC: 11 10*3/uL (ref 3.6–11.0)

## 2018-02-15 LAB — COMPREHENSIVE METABOLIC PANEL
ALK PHOS: 66 U/L (ref 38–126)
ALT: 20 U/L (ref 0–44)
AST: 26 U/L (ref 15–41)
Albumin: 4 g/dL (ref 3.5–5.0)
Anion gap: 6 (ref 5–15)
BILIRUBIN TOTAL: 0.7 mg/dL (ref 0.3–1.2)
BUN: 15 mg/dL (ref 6–20)
CALCIUM: 9.4 mg/dL (ref 8.9–10.3)
CO2: 28 mmol/L (ref 22–32)
CREATININE: 0.55 mg/dL (ref 0.44–1.00)
Chloride: 108 mmol/L (ref 98–111)
GFR calc Af Amer: >60 mL/min (ref >60)
GFR calc non Af Amer: >60 mL/min (ref >60)
Glucose, Bld: 110 mg/dL — ABNORMAL HIGH (ref 70–99)
Potassium: 4.2 mmol/L (ref 3.5–5.1)
SODIUM: 142 mmol/L (ref 135–145)
TOTAL PROTEIN: 6.4 g/dL — AB (ref 6.5–8.1)

## 2018-02-15 MED ORDER — SODIUM CHLORIDE 0.9 % IV SOLN: 10.0000 mg | Freq: Once | INTRAVENOUS | Status: DC

## 2018-02-15 MED ORDER — SODIUM CHLORIDE 0.9 % IV SOLN
1000.0000 mg | Freq: Once | INTRAVENOUS | Status: AC
  Administered 2018-02-15: 1000 mg via INTRAVENOUS
  Filled 2018-02-15: qty 50

## 2018-02-15 MED ORDER — PEGFILGRASTIM 6 MG/0.6ML ~~LOC~~ PSKT
6.0000 mg | PREFILLED_SYRINGE | Freq: Once | SUBCUTANEOUS | Status: AC
  Administered 2018-02-15: 6 mg via SUBCUTANEOUS
  Filled 2018-02-15: qty 0.6

## 2018-02-15 MED ORDER — SODIUM CHLORIDE 0.9 % IV SOLN
Freq: Once | INTRAVENOUS | Status: AC
  Administered 2018-02-15: 10:00:00 via INTRAVENOUS
  Filled 2018-02-15: qty 250

## 2018-02-15 MED ORDER — SODIUM CHLORIDE 0.9 % IV SOLN
75.0000 mg/m2 | Freq: Once | INTRAVENOUS | Status: AC
  Administered 2018-02-15: 130 mg via INTRAVENOUS
  Filled 2018-02-15: qty 13

## 2018-02-15 MED ORDER — PALONOSETRON HCL INJECTION 0.25 MG/5ML
0.2500 mg | Freq: Once | INTRAVENOUS | Status: AC
  Administered 2018-02-15: 0.25 mg via INTRAVENOUS
  Filled 2018-02-15: qty 5

## 2018-02-15 MED ORDER — DEXAMETHASONE SODIUM PHOSPHATE 10 MG/ML IJ SOLN
10.0000 mg | Freq: Once | INTRAMUSCULAR | Status: AC
  Administered 2018-02-15: 10 mg via INTRAVENOUS
  Filled 2018-02-15: qty 1

## 2018-02-15 NOTE — Progress Notes (Signed)
[place] OFFICE PROGRESS NOTE  Patient Care Team: Cletis Athens, MD as PCP - General (Internal Medicine)  Cancer Staging No matching staging information was found for the patient.   Oncology History   # May 2019- RIGHT BREAST CA s/p Lumpec & SLNBx [pT1c pN0 (sn); G-1;  margins clear; ER/PR > 90%; Her 2 neu- FISH-NEG. [place] [Dr.Byrnett]- HIGH RISK  # July 29th TC x4  GENETIC TESTING: [Dr.Byrnett] NEGATIVE FOR - ATM/ BRCA2/CHEK2/PTEN/TP53/BRCA1/CDH1/PALB2/ STK11   DIAGNOSIS: BREAST CA   STAGE:  I/mammaprint-H ;GOALS: cure  CURRENT/MOST RECENT THERAPY: TC x4      Malignant neoplasm of upper-outer quadrant of right breast in female, estrogen receptor positive ([place])   11/03/2017 Initial Diagnosis    Malignant neoplasm of upper-outer quadrant of right breast in female, estrogen receptor positive ([place])       INTERVAL HISTORY:  Crystal Branch 47 y.o.  female pleasant patient above history of stage I breast cancer ER PR positive HER-2/neu negative high risk mamma print is currently status post cycle #3 of Taxotere Cytoxan chemotherapy.  Patient denies any nausea vomiting or diarrhea.  Notes of mild worsening fatigue.  Continued hair loss.  Minimal tingling and numbness in the feet.  No fevers or chills.   Review of Systems  Constitutional: Positive for malaise/fatigue. Negative for chills, diaphoresis, fever and weight loss.  HENT: Negative for nosebleeds and sore throat.   Eyes: Negative for double vision.  Respiratory: Negative for cough, hemoptysis, sputum production, shortness of breath and wheezing.   Cardiovascular: Negative for chest pain, palpitations, orthopnea and leg swelling.  Gastrointestinal: Negative for abdominal pain, blood in stool, constipation, heartburn, melena, nausea and vomiting.  Genitourinary: Negative for dysuria, frequency and urgency.  Musculoskeletal: Negative for back pain and joint pain.  Skin: Negative.  Negative for itching  and rash.  Neurological: Positive for tingling. Negative for dizziness, focal weakness, weakness and headaches.  Endo/Heme/Allergies: Does not bruise/bleed easily.  Psychiatric/Behavioral: Negative for depression. The patient is not nervous/anxious and does not have insomnia.       PAST MEDICAL HISTORY :  Past Medical History:  Diagnosis Date  . Breast CA ([place]) 09/30/2017   INVASIVE MAMMARY CARCINOMA WITH MUCINOUS FEATURES/ ER/PR 90%; Her 2 neu: Negative.  Mammoprint: High risk.   Marland Kitchen Dysrhythmia     PAST SURGICAL HISTORY :   Past Surgical History:  Procedure Laterality Date  . 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 BREAST LUMPECTOMY Right 10/26/2017  . BREAST LUMPECTOMY WITH SENTINEL LYMPH NODE BIOPSY Right 10/26/2017   Procedure: BREAST LUMPECTOMY WITH SENTINEL LYMPH NODE BX;  Surgeon: Robert Bellow, MD;  Location: [facility] ORS;  Service: General;  Laterality: Right;  . TONSILLECTOMY  2015    FAMILY HISTORY :   Family History  Problem Relation Age of Onset  . Breast cancer Maternal Aunt 60  . Breast cancer Maternal Grandmother 61    SOCIAL HISTORY:   Social History   Tobacco Use  . Smoking status: Never Smoker  . Smokeless tobacco: Never Used  Substance Use Topics  . Alcohol use: Never    Frequency: Never  . Drug use: Never    ALLERGIES:  has No Known Allergies.  MEDICATIONS:  Current Outpatient Medications  Medication Sig Dispense Refill  . acetaminophen (TYLENOL) 325 MG tablet Take 325 mg by mouth every 6 (six) hours as needed (for pain).    Marland Kitchen dexamethasone (DECADRON) 4  MG tablet Take one pill AM & PM x 3 days; start the day prior to chemo. 60 tablet 0  . loratadine (CLARITIN) 10 MG tablet Take 1 tablet (10 mg total) by mouth daily.    . Misc Natural Products (OSTEO BI-FLEX JOINT SHIELD PO) Take 1 capsule by mouth 2 (two) times daily.     . Multiple Vitamin (MULTIVITAMIN WITH  MINERALS) TABS tablet Take 1 tablet by mouth daily.    . ondansetron (ZOFRAN) 8 MG tablet One pill every 8 hours as needed for nausea/vomitting. 40 tablet 1  . Probiotic Product ([employer]) CAPS Take 1 capsule by mouth daily with supper.     . prochlorperazine (COMPAZINE) 10 MG tablet Take 1 tablet (10 mg total) by mouth every 6 (six) hours as needed for nausea or vomiting. 40 tablet 1   No current facility-administered medications for this visit.    Facility-Administered Medications Ordered in Other Visits  Medication Dose Route Frequency Provider Last Rate Last Dose  . cyclophosphamide (CYTOXAN) 1,000 mg in sodium chloride 0.9 % 250 mL chemo infusion  1,000 mg Intravenous Once Charlaine Dalton R, MD      . DOCEtaxel (TAXOTERE) 130 mg in sodium chloride 0.9 % 250 mL chemo infusion  75 mg/m2 (Treatment Plan Recorded) Intravenous Once Cammie Sickle, MD 263 mL/hr at 02/15/18 0953 130 mg at 02/15/18 0953    PHYSICAL EXAMINATION: ECOG PERFORMANCE STATUS: 0 - Asymptomatic  BP 102/65 (Patient Position: Sitting)   Pulse 64   Temp 97.6 F (36.4 C) (Tympanic)   Resp 18   Ht 5' 3"  (1.6 m)   Wt 152 lb 8 oz (69.2 kg)   BMI 27.01 kg/m   Filed Weights   02/15/18 0828  Weight: 152 lb 8 oz (69.2 kg)   Physical Exam  Constitutional: She is oriented to person, place, and time and well-developed, well-nourished, and in no distress.  Alone.  Walking by self.  HENT:  Head: Normocephalic and atraumatic.  Mouth/Throat: Oropharynx is clear and moist. No oropharyngeal exudate.  Eyes: Pupils are equal, round, and reactive to light.  Neck: Normal range of motion. Neck supple.  Cardiovascular: Normal rate and regular rhythm.  Pulmonary/Chest: No respiratory distress. She has no wheezes.  Abdominal: Soft. Bowel sounds are normal. She exhibits no distension and no mass. There is no tenderness. There is no rebound and no guarding.  Musculoskeletal: Normal range of motion. She  exhibits no edema or tenderness.  Neurological: She is alert and oriented to person, place, and time.  Skin: Skin is warm.  Psychiatric: Affect normal.      LABORATORY DATA:  I have reviewed the data as listed    Component Value Date/Time   NA 142 02/15/2018 0749   NA 143 10/13/2017 0920   NA 142 06/19/2011 2227   K 4.2 02/15/2018 0749   K 3.8 06/19/2011 2227   CL 108 02/15/2018 0749   CL 107 06/19/2011 2227   CO2 28 02/15/2018 0749   CO2 21 06/19/2011 2227   GLUCOSE 110 (H) 02/15/2018 0749   GLUCOSE 103 (H) 06/19/2011 2227   BUN 15 02/15/2018 0749   BUN 13 10/13/2017 0920   BUN 10 06/19/2011 2227   CREATININE 0.55 02/15/2018 0749   CREATININE 0.75 06/19/2011 2227   CALCIUM 9.4 02/15/2018 0749   CALCIUM 9.5 06/19/2011 2227   PROT 6.4 (L) 02/15/2018 0749   PROT 6.5 10/13/2017 0920   ALBUMIN 4.0 02/15/2018 0749   ALBUMIN 4.6 10/13/2017 0920  AST 26 02/15/2018 0749   ALT 20 02/15/2018 0749   ALKPHOS 66 02/15/2018 0749   BILITOT 0.7 02/15/2018 0749   BILITOT 0.4 10/13/2017 0920   GFRNONAA >60 02/15/2018 0749   GFRNONAA >60 06/19/2011 2227   GFRAA >60 02/15/2018 0749   GFRAA >60 06/19/2011 2227    No results found for: SPEP, UPEP  Lab Results  Component Value Date   WBC 11.0 02/15/2018   NEUTROABS 9.6 (H) 02/15/2018   HGB 11.0 (L) 02/15/2018   HCT 32.0 (L) 02/15/2018   MCV 97.9 02/15/2018   PLT 256 02/15/2018      Chemistry      Component Value Date/Time   NA 142 02/15/2018 0749   NA 143 10/13/2017 0920   NA 142 06/19/2011 2227   K 4.2 02/15/2018 0749   K 3.8 06/19/2011 2227   CL 108 02/15/2018 0749   CL 107 06/19/2011 2227   CO2 28 02/15/2018 0749   CO2 21 06/19/2011 2227   BUN 15 02/15/2018 0749   BUN 13 10/13/2017 0920   BUN 10 06/19/2011 2227   CREATININE 0.55 02/15/2018 0749   CREATININE 0.75 06/19/2011 2227      Component Value Date/Time   CALCIUM 9.4 02/15/2018 0749   CALCIUM 9.5 06/19/2011 2227   ALKPHOS 66 02/15/2018 0749   AST 26  02/15/2018 0749   ALT 20 02/15/2018 0749   BILITOT 0.7 02/15/2018 0749   BILITOT 0.4 10/13/2017 0920       RADIOGRAPHIC STUDIES: I have personally reviewed the radiological images as listed and agreed with the findings in the report. No results found.   ASSESSMENT & PLAN:  Malignant neoplasm of upper-outer quadrant of right breast in female, estrogen receptor positive ([place]) #Stage I ER PR positive HER-2 negative; HIGH risk mamma print. On adjuvant chemo with Tax-Cytoxan. STABLE.   # Proceed with TC # 4today. Labs today reviewed;  acceptable for treatment today. Pt will speak to RT re: starting RT in 3 weeks.   #Diarrhea grade 1-from Taxotere chemotherapy.  Resolved.   #Fatigue grade 1-from Taxotere chemotherapy. STABLE.   # Peripheral neuropathy- G-1; STABLE.   # follow up in 3 week/labs; NO chemo; flu shot.     Orders Placed This Encounter  Procedures  . CBC with Differential    Standing Status:   Future    Standing Expiration Date:   02/16/2019  . Basic metabolic panel    Standing Status:   Future    Standing Expiration Date:   02/16/2019   All questions were answered. The patient knows to call the clinic with any problems, questions or concerns.      Cammie Sickle, MD 02/15/2018 9:54 AM

## 2018-02-15 NOTE — Assessment & Plan Note (Addendum)
#  Stage I ER PR positive HER-2 negative; HIGH risk mamma print. On adjuvant chemo with Tax-Cytoxan. STABLE.   # Proceed with TC # 4today. Labs today reviewed;  acceptable for treatment today. Pt will speak to RT re: starting RT in 3 weeks.   #Diarrhea grade 1-from Taxotere chemotherapy.  Resolved.   #Fatigue grade 1-from Taxotere chemotherapy. STABLE.   # Peripheral neuropathy- G-1; STABLE.   # follow up in 3 week/labs; NO chemo; flu shot.

## 2018-03-08 ENCOUNTER — Encounter: Payer: Self-pay | Admitting: Internal Medicine

## 2018-03-08 ENCOUNTER — Ambulatory Visit
Admission: RE | Admit: 2018-03-08 | Discharge: 2018-03-08 | Disposition: A | Discharge status: Home / Self Care | Payer: Managed Care, Other (non HMO) | Source: Ambulatory Visit | Attending: Radiation Oncology | Admitting: Radiation Oncology

## 2018-03-08 ENCOUNTER — Inpatient Hospital Stay (HOSPITAL_BASED_OUTPATIENT_CLINIC_OR_DEPARTMENT_OTHER): Payer: Managed Care, Other (non HMO) | Admitting: Internal Medicine

## 2018-03-08 ENCOUNTER — Other Ambulatory Visit: Payer: Self-pay

## 2018-03-08 ENCOUNTER — Inpatient Hospital Stay: Payer: Managed Care, Other (non HMO) | Attending: Internal Medicine

## 2018-03-08 VITALS — BP 101/61 | HR 74 | Temp 97.1°F | Resp 16

## 2018-03-08 DIAGNOSIS — Z79899 Other long term (current) drug therapy: Secondary | ICD-10-CM

## 2018-03-08 DIAGNOSIS — Z170 Estrogen receptor positive status [ER+]: Principal | ICD-10-CM

## 2018-03-08 DIAGNOSIS — R5383 Other fatigue: Secondary | ICD-10-CM | POA: Insufficient documentation

## 2018-03-08 DIAGNOSIS — Z9221 Personal history of antineoplastic chemotherapy: Secondary | ICD-10-CM | POA: Diagnosis not present

## 2018-03-08 DIAGNOSIS — C50411 Malignant neoplasm of upper-outer quadrant of right female breast: Secondary | ICD-10-CM

## 2018-03-08 DIAGNOSIS — G629 Polyneuropathy, unspecified: Secondary | ICD-10-CM | POA: Diagnosis not present

## 2018-03-08 LAB — BASIC METABOLIC PANEL
ANION GAP: 4 — AB (ref 5–15)
BUN: 17 mg/dL (ref 6–20)
CO2: 31 mmol/L (ref 22–32)
CREATININE: 0.5 mg/dL (ref 0.44–1.00)
Calcium: 9.2 mg/dL (ref 8.9–10.3)
Chloride: 107 mmol/L (ref 98–111)
Glucose, Bld: 94 mg/dL (ref 70–99)
Potassium: 4.3 mmol/L (ref 3.5–5.1)
SODIUM: 142 mmol/L (ref 135–145)

## 2018-03-08 LAB — CBC WITH DIFFERENTIAL/PLATELET
Abs Immature Granulocytes: 0.03 10*3/uL (ref 0.00–0.07)
BASOS ABS: 0 10*3/uL (ref 0.0–0.1)
BASOS PCT: 0 %
EOS ABS: 0 10*3/uL (ref 0.0–0.5)
EOS PCT: 0 %
HEMATOCRIT: 31.5 % — AB (ref 36.0–46.0)
Hemoglobin: 10 g/dL — ABNORMAL LOW (ref 12.0–15.0)
IMMATURE GRANULOCYTES: 0 %
LYMPHS ABS: 0.8 10*3/uL (ref 0.7–4.0)
Lymphocytes Relative: 12 %
MCH: 32.4 pg (ref 26.0–34.0)
MCHC: 31.7 g/dL (ref 30.0–36.0)
MCV: 101.9 fL — AB (ref 80.0–100.0)
Monocytes Absolute: 0.6 10*3/uL (ref 0.1–1.0)
Monocytes Relative: 9 %
NEUTROS PCT: 79 %
NRBC: 0 % (ref 0.0–0.2)
Neutro Abs: 5.3 10*3/uL (ref 1.7–7.7)
PLATELETS: 259 10*3/uL (ref 150–400)
RBC: 3.09 MIL/uL — ABNORMAL LOW (ref 3.87–5.11)
RDW: 15.4 % (ref 11.5–15.5)
WBC: 6.7 10*3/uL (ref 4.0–10.5)

## 2018-03-08 NOTE — Progress Notes (Signed)
Radiation Oncology Follow up Note  Name: Crystal Branch   Date:   03/08/2018 MRN:  [id number] DOB: Feb 17, 1971    This 47 y.o. female presents to the clinic today for commencing radiation therapy treatments for patient stage I (T1 CN 0 M0) ER/PR positive HER-2/neu negative invasive mammary carcinoma status post chemotherapy  REFERRING PROVIDER: Cletis Athens, MD  HPI: patient is a 47 year old female originally consult back in June 2019 when she presented with a stage I (T1 CN 0 M0) ER/PR positive HER-2/neu negative invasive mammary carcinoma status post wide local excision. She is seen today fter completing her chemotherapy.patient has completed Cytoxan and Taxotere with some minimal peripheral neuropathy and fatigue.MammaPrint 10 showed high risk disease.she specifically denies breast tenderness cough or bone pain.  COMPLICATIONS OF TREATMENT: none  FOLLOW UP COMPLIANCE: keeps appointments   PHYSICAL EXAM:  There were no vitals taken for this visit. Patient is status post wide local excision of the right breast. No dominant mass or nodularity is noted in either breast in 2 positions examined. No axillary or supraclavicular adenopathy is identified.Well-developed well-nourished patient in NAD. HEENT reveals PERLA, EOMI, discs not visualized.  Oral cavity is clear. No oral mucosal lesions are identified. Neck is clear without evidence of cervical or supraclavicular adenopathy. Lungs are clear to A&P. Cardiac examination is essentially unremarkable with regular rate and rhythm without murmur rub or thrill. Abdomen is benign with no organomegaly or masses noted. Motor sensory and DTR levels are equal and symmetric in the upper and lower extremities. Cranial nerves II through XII are grossly intact. Proprioception is intact. No peripheral adenopathy or edema is identified. No motor or sensory levels are noted. Crude visual fields are within normal range.  RADIOLOGY RESULTS: mammogram and ultrasound  reviewed  PLAN: this time I to go ahead with whole breast radiation. I will treat hypofractionated course of treatment to her right breast over 4 weeks boosting her scar another 1600 cGy using electron beam. Risks and benefits of treatment including skin reaction fatigue alteration of blood counts possible inclusion of superficial lung all were discussed in detail with the patient. She seems to comprehend my treatment plan well. I have personally set up and ordered CT simulation for later this week.There will be extra effort by both professional staff as well as technical staff to coordinate and manage concurrent chemoradiation and ensuing side effects during her treatments.  I would like to take this opportunity to thank you for allowing me to participate in the care of your patient.Noreene Filbert, MD

## 2018-03-08 NOTE — Progress Notes (Signed)
[facility] OFFICE PROGRESS NOTE  Patient Care Team: Cletis Athens, MD as PCP - General (Internal Medicine)  Cancer Staging No matching staging information was found for the patient.   Oncology History   # May 2019- RIGHT BREAST CA s/p Lumpec & SLNBx [pT1c pN0 (sn); G-1;  margins clear; ER/PR > 90%; Her 2 neu- FISH-NEG. [place] [Dr.Byrnett]- HIGH RISK  # July 29th TC x4 [finished 02/15/2018]  GENETIC TESTING: [Dr.Byrnett] NEGATIVE FOR - ATM/ BRCA2/CHEK2/PTEN/TP53/BRCA1/CDH1/PALB2/ STK11   DIAGNOSIS: BREAST CA   STAGE:  I/mammaprint-H ;GOALS: cure  CURRENT/MOST RECENT THERAPY: plan RT      Malignant neoplasm of upper-outer quadrant of right breast in female, estrogen receptor positive ([place])   11/03/2017 Initial Diagnosis    Malignant neoplasm of upper-outer quadrant of right breast in female, estrogen receptor positive ([place])       INTERVAL HISTORY:  Crystal Branch 47 y.o.  female pleasant patient above history of stage I breast cancer ER PR positive HER-2/neu negative high risk mamma print is currently status post cycle #4 of Taxotere Cytoxan chemotherapy.  Patient is here for follow-up.  Patient continues to complain of mild fatigue.  Mild tingling and numbness in the extremities.  Patient denies any nausea vomiting or diarrhea.  Notes of mild worsening fatigue.  Continued hair loss.  Minimal tingling and numbness in the feet.  No fevers or chills.  Review of Systems  Constitutional: Positive for malaise/fatigue. Negative for chills, diaphoresis, fever and weight loss.  HENT: Negative for nosebleeds and sore throat.   Eyes: Negative for double vision.  Respiratory: Negative for cough, hemoptysis, sputum production, shortness of breath and wheezing.   Cardiovascular: Negative for chest pain, palpitations, orthopnea and leg swelling.  Gastrointestinal: Negative for abdominal pain, blood in stool, constipation, heartburn, melena, nausea and vomiting.   Genitourinary: Negative for dysuria, frequency and urgency.  Musculoskeletal: Negative for back pain and joint pain.  Skin: Negative.  Negative for itching and rash.  Neurological: Positive for tingling. Negative for dizziness, focal weakness, weakness and headaches.  Endo/Heme/Allergies: Does not bruise/bleed easily.  Psychiatric/Behavioral: Negative for depression. The patient is not nervous/anxious and does not have insomnia.       PAST MEDICAL HISTORY :  Past Medical History:  Diagnosis Date  . Breast CA ([place]) 09/30/2017   INVASIVE MAMMARY CARCINOMA WITH MUCINOUS FEATURES/ ER/PR 90%; Her 2 neu: Negative.  Mammoprint: High risk.   Marland Kitchen Dysrhythmia     PAST SURGICAL HISTORY :   Past Surgical History:  Procedure Laterality Date  . 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 BREAST LUMPECTOMY Right 10/26/2017  . BREAST LUMPECTOMY WITH SENTINEL LYMPH NODE BIOPSY Right 10/26/2017   Procedure: BREAST LUMPECTOMY WITH SENTINEL LYMPH NODE BX;  Surgeon: Robert Bellow, MD;  Location: [facility] ORS;  Service: General;  Laterality: Right;  . TONSILLECTOMY  2015    FAMILY HISTORY :   Family History  Problem Relation Age of Onset  . Breast cancer Maternal Aunt 60  . Breast cancer Maternal Grandmother 38    SOCIAL HISTORY:   Social History   Tobacco Use  . Smoking status: Never Smoker  . Smokeless tobacco: Never Used  Substance Use Topics  . Alcohol use: Never    Frequency: Never  . Drug use: Never    ALLERGIES:  has No Known Allergies.  MEDICATIONS:  Current Outpatient Medications  Medication Sig Dispense Refill  . acetaminophen (TYLENOL)  325 MG tablet Take 325 mg by mouth every 6 (six) hours as needed (for pain).    Marland Kitchen dexamethasone (DECADRON) 4 MG tablet Take one pill AM & PM x 3 days; start the day prior to chemo. 60 tablet 0  . loratadine (CLARITIN) 10 MG tablet Take 1 tablet (10 mg total) by mouth  daily.    . Misc Natural Products (OSTEO BI-FLEX JOINT SHIELD PO) Take 1 capsule by mouth 2 (two) times daily.     . Multiple Vitamin (MULTIVITAMIN WITH MINERALS) TABS tablet Take 1 tablet by mouth daily.    . ondansetron (ZOFRAN) 8 MG tablet One pill every 8 hours as needed for nausea/vomitting. 40 tablet 1  . Probiotic Product ([place]) CAPS Take 1 capsule by mouth daily with supper.     . prochlorperazine (COMPAZINE) 10 MG tablet Take 1 tablet (10 mg total) by mouth every 6 (six) hours as needed for nausea or vomiting. 40 tablet 1   No current facility-administered medications for this visit.     PHYSICAL EXAMINATION: ECOG PERFORMANCE STATUS: 0 - Asymptomatic  BP 101/61 (BP Location: Left Arm, Patient Position: Sitting)   Pulse 74   Temp (!) 97.1 F (36.2 C) (Tympanic)   Resp 16   There were no vitals filed for this visit. Physical Exam  Constitutional: She is oriented to person, place, and time and well-developed, well-nourished, and in no distress.  Alone.  Walking by self.  HENT:  Head: Normocephalic and atraumatic.  Mouth/Throat: Oropharynx is clear and moist. No oropharyngeal exudate.  Eyes: Pupils are equal, round, and reactive to light.  Neck: Normal range of motion. Neck supple.  Cardiovascular: Normal rate and regular rhythm.  Pulmonary/Chest: No respiratory distress. She has no wheezes.  Abdominal: Soft. Bowel sounds are normal. She exhibits no distension and no mass. There is no tenderness. There is no rebound and no guarding.  Musculoskeletal: Normal range of motion. She exhibits no edema or tenderness.  Neurological: She is alert and oriented to person, place, and time.  Skin: Skin is warm.  Psychiatric: Affect normal.      LABORATORY DATA:  I have reviewed the data as listed    Component Value Date/Time   NA 142 03/08/2018 0912   NA 143 10/13/2017 0920   NA 142 06/19/2011 2227   K 4.3 03/08/2018 0912   K 3.8 06/19/2011 2227   CL 107  03/08/2018 0912   CL 107 06/19/2011 2227   CO2 31 03/08/2018 0912   CO2 21 06/19/2011 2227   GLUCOSE 94 03/08/2018 0912   GLUCOSE 103 (H) 06/19/2011 2227   BUN 17 03/08/2018 0912   BUN 13 10/13/2017 0920   BUN 10 06/19/2011 2227   CREATININE 0.50 03/08/2018 0912   CREATININE 0.75 06/19/2011 2227   CALCIUM 9.2 03/08/2018 0912   CALCIUM 9.5 06/19/2011 2227   PROT 6.4 (L) 02/15/2018 0749   PROT 6.5 10/13/2017 0920   ALBUMIN 4.0 02/15/2018 0749   ALBUMIN 4.6 10/13/2017 0920   AST 26 02/15/2018 0749   ALT 20 02/15/2018 0749   ALKPHOS 66 02/15/2018 0749   BILITOT 0.7 02/15/2018 0749   BILITOT 0.4 10/13/2017 0920   GFRNONAA >60 03/08/2018 0912   GFRNONAA >60 06/19/2011 2227   GFRAA >60 03/08/2018 0912   GFRAA >60 06/19/2011 2227    No results found for: SPEP, UPEP  Lab Results  Component Value Date   WBC 6.7 03/08/2018   NEUTROABS 5.3 03/08/2018   HGB 10.0 (L)  03/08/2018   HCT 31.5 (L) 03/08/2018   MCV 101.9 (H) 03/08/2018   PLT 259 03/08/2018      Chemistry      Component Value Date/Time   NA 142 03/08/2018 0912   NA 143 10/13/2017 0920   NA 142 06/19/2011 2227   K 4.3 03/08/2018 0912   K 3.8 06/19/2011 2227   CL 107 03/08/2018 0912   CL 107 06/19/2011 2227   CO2 31 03/08/2018 0912   CO2 21 06/19/2011 2227   BUN 17 03/08/2018 0912   BUN 13 10/13/2017 0920   BUN 10 06/19/2011 2227   CREATININE 0.50 03/08/2018 0912   CREATININE 0.75 06/19/2011 2227      Component Value Date/Time   CALCIUM 9.2 03/08/2018 0912   CALCIUM 9.5 06/19/2011 2227   ALKPHOS 66 02/15/2018 0749   AST 26 02/15/2018 0749   ALT 20 02/15/2018 0749   BILITOT 0.7 02/15/2018 0749   BILITOT 0.4 10/13/2017 0920       RADIOGRAPHIC STUDIES: I have personally reviewed the radiological images as listed and agreed with the findings in the report. No results found.   ASSESSMENT & PLAN:  Malignant neoplasm of upper-outer quadrant of right breast in female, estrogen receptor positive  ([place]) #Stage I ER PR positive HER-2 negative; HIGH risk mamma print. S/P adjuvant chemo with Tax-Cytoxan x 4  STABLE.   # proceed with RT; awaiting to see Dr.Crystal today.   #Fatigue grade 1-from Taxotere chemotherapy.improved.  # Peripheral neuropathy- G-1; STABLE.   DISPOSITION:  # Follow up in 6 weeks; MD; NO labs-Dr.B     No orders of the defined types were placed in this encounter.  All questions were answered. The patient knows to call the clinic with any problems, questions or concerns.      Cammie Sickle, MD 03/08/2018 4:50 PM

## 2018-03-08 NOTE — Assessment & Plan Note (Signed)
#  Stage I ER PR positive HER-2 negative; HIGH risk mamma print. S/P adjuvant chemo with Tax-Cytoxan x 4  STABLE.   # proceed with RT; awaiting to see Dr.Crystal today.   #Fatigue grade 1-from Taxotere chemotherapy.improved.  # Peripheral neuropathy- G-1; STABLE.   DISPOSITION:  # Follow up in 6 weeks; MD; NO labs-Dr.B

## 2018-03-09 ENCOUNTER — Encounter: Payer: Self-pay | Admitting: *Deleted

## 2018-03-10 ENCOUNTER — Ambulatory Visit
Admission: RE | Admit: 2018-03-10 | Discharge: 2018-03-10 | Disposition: A | Discharge status: Home / Self Care | Payer: Managed Care, Other (non HMO) | Source: Ambulatory Visit | Attending: Radiation Oncology | Admitting: Radiation Oncology

## 2018-03-10 DIAGNOSIS — Z170 Estrogen receptor positive status [ER+]: Secondary | ICD-10-CM | POA: Diagnosis present

## 2018-03-10 DIAGNOSIS — C50411 Malignant neoplasm of upper-outer quadrant of right female breast: Secondary | ICD-10-CM | POA: Insufficient documentation

## 2018-03-11 DIAGNOSIS — C50411 Malignant neoplasm of upper-outer quadrant of right female breast: Secondary | ICD-10-CM | POA: Diagnosis not present

## 2018-03-19 ENCOUNTER — Other Ambulatory Visit: Payer: Self-pay | Admitting: *Deleted

## 2018-03-19 DIAGNOSIS — Z170 Estrogen receptor positive status [ER+]: Principal | ICD-10-CM

## 2018-03-19 DIAGNOSIS — C50411 Malignant neoplasm of upper-outer quadrant of right female breast: Secondary | ICD-10-CM

## 2018-03-23 ENCOUNTER — Ambulatory Visit
Admission: RE | Admit: 2018-03-23 | Discharge: 2018-03-23 | Disposition: A | Discharge status: Home / Self Care | Payer: Managed Care, Other (non HMO) | Source: Ambulatory Visit | Attending: Radiation Oncology | Admitting: Radiation Oncology

## 2018-03-23 DIAGNOSIS — C50411 Malignant neoplasm of upper-outer quadrant of right female breast: Secondary | ICD-10-CM | POA: Diagnosis not present

## 2018-03-23 DIAGNOSIS — Z170 Estrogen receptor positive status [ER+]: Secondary | ICD-10-CM | POA: Diagnosis present

## 2018-03-24 ENCOUNTER — Ambulatory Visit
Admission: RE | Admit: 2018-03-24 | Discharge: 2018-03-24 | Disposition: A | Discharge status: Home / Self Care | Payer: Managed Care, Other (non HMO) | Source: Ambulatory Visit | Attending: Radiation Oncology | Admitting: Radiation Oncology

## 2018-03-24 DIAGNOSIS — C50411 Malignant neoplasm of upper-outer quadrant of right female breast: Secondary | ICD-10-CM | POA: Diagnosis not present

## 2018-03-25 ENCOUNTER — Ambulatory Visit
Admission: RE | Admit: 2018-03-25 | Discharge: 2018-03-25 | Disposition: A | Discharge status: Home / Self Care | Payer: Managed Care, Other (non HMO) | Source: Ambulatory Visit | Attending: Radiation Oncology | Admitting: Radiation Oncology

## 2018-03-25 DIAGNOSIS — C50411 Malignant neoplasm of upper-outer quadrant of right female breast: Secondary | ICD-10-CM | POA: Diagnosis not present

## 2018-03-26 ENCOUNTER — Ambulatory Visit
Admission: RE | Admit: 2018-03-26 | Discharge: 2018-03-26 | Disposition: A | Discharge status: Home / Self Care | Payer: Managed Care, Other (non HMO) | Source: Ambulatory Visit | Attending: Radiation Oncology | Admitting: Radiation Oncology

## 2018-03-26 DIAGNOSIS — C50411 Malignant neoplasm of upper-outer quadrant of right female breast: Secondary | ICD-10-CM | POA: Diagnosis not present

## 2018-03-29 ENCOUNTER — Ambulatory Visit
Admission: RE | Admit: 2018-03-29 | Discharge: 2018-03-29 | Disposition: A | Discharge status: Home / Self Care | Payer: Managed Care, Other (non HMO) | Source: Ambulatory Visit | Attending: Radiation Oncology | Admitting: Radiation Oncology

## 2018-03-29 DIAGNOSIS — C50411 Malignant neoplasm of upper-outer quadrant of right female breast: Secondary | ICD-10-CM | POA: Diagnosis not present

## 2018-03-30 ENCOUNTER — Ambulatory Visit
Admission: RE | Admit: 2018-03-30 | Discharge: 2018-03-30 | Disposition: A | Discharge status: Home / Self Care | Payer: Managed Care, Other (non HMO) | Source: Ambulatory Visit | Attending: Radiation Oncology | Admitting: Radiation Oncology

## 2018-03-30 DIAGNOSIS — C50411 Malignant neoplasm of upper-outer quadrant of right female breast: Secondary | ICD-10-CM | POA: Diagnosis not present

## 2018-03-31 ENCOUNTER — Ambulatory Visit
Admission: RE | Admit: 2018-03-31 | Discharge: 2018-03-31 | Disposition: A | Discharge status: Home / Self Care | Payer: Managed Care, Other (non HMO) | Source: Ambulatory Visit | Attending: Radiation Oncology | Admitting: Radiation Oncology

## 2018-03-31 DIAGNOSIS — C50411 Malignant neoplasm of upper-outer quadrant of right female breast: Secondary | ICD-10-CM | POA: Diagnosis not present

## 2018-04-01 ENCOUNTER — Ambulatory Visit
Admission: RE | Admit: 2018-04-01 | Discharge: 2018-04-01 | Disposition: A | Discharge status: Home / Self Care | Payer: Managed Care, Other (non HMO) | Source: Ambulatory Visit | Attending: Radiation Oncology | Admitting: Radiation Oncology

## 2018-04-01 DIAGNOSIS — C50411 Malignant neoplasm of upper-outer quadrant of right female breast: Secondary | ICD-10-CM | POA: Diagnosis not present

## 2018-04-02 ENCOUNTER — Ambulatory Visit
Admission: RE | Admit: 2018-04-02 | Discharge: 2018-04-02 | Disposition: A | Discharge status: Home / Self Care | Payer: Managed Care, Other (non HMO) | Source: Ambulatory Visit | Attending: Radiation Oncology | Admitting: Radiation Oncology

## 2018-04-02 DIAGNOSIS — C50411 Malignant neoplasm of upper-outer quadrant of right female breast: Secondary | ICD-10-CM | POA: Diagnosis not present

## 2018-04-05 ENCOUNTER — Ambulatory Visit
Admission: RE | Admit: 2018-04-05 | Discharge: 2018-04-05 | Disposition: A | Discharge status: Home / Self Care | Payer: Managed Care, Other (non HMO) | Source: Ambulatory Visit | Attending: Radiation Oncology | Admitting: Radiation Oncology

## 2018-04-05 DIAGNOSIS — C50411 Malignant neoplasm of upper-outer quadrant of right female breast: Secondary | ICD-10-CM | POA: Diagnosis not present

## 2018-04-06 ENCOUNTER — Ambulatory Visit
Admission: RE | Admit: 2018-04-06 | Discharge: 2018-04-06 | Disposition: A | Discharge status: Home / Self Care | Payer: Managed Care, Other (non HMO) | Source: Ambulatory Visit | Attending: Radiation Oncology | Admitting: Radiation Oncology

## 2018-04-06 DIAGNOSIS — C50411 Malignant neoplasm of upper-outer quadrant of right female breast: Secondary | ICD-10-CM | POA: Diagnosis not present

## 2018-04-07 ENCOUNTER — Ambulatory Visit
Admission: RE | Admit: 2018-04-07 | Discharge: 2018-04-07 | Disposition: A | Discharge status: Home / Self Care | Payer: Managed Care, Other (non HMO) | Source: Ambulatory Visit | Attending: Radiation Oncology | Admitting: Radiation Oncology

## 2018-04-07 ENCOUNTER — Inpatient Hospital Stay: Payer: Managed Care, Other (non HMO)

## 2018-04-07 DIAGNOSIS — Z170 Estrogen receptor positive status [ER+]: Principal | ICD-10-CM

## 2018-04-07 DIAGNOSIS — C50411 Malignant neoplasm of upper-outer quadrant of right female breast: Secondary | ICD-10-CM | POA: Diagnosis not present

## 2018-04-07 LAB — CBC
HCT: 38.3 % (ref 36.0–46.0)
Hemoglobin: 12.3 g/dL (ref 12.0–15.0)
MCH: 33.1 pg (ref 26.0–34.0)
MCHC: 32.1 g/dL (ref 30.0–36.0)
MCV: 103 fL — ABNORMAL HIGH (ref 80.0–100.0)
NRBC: 0 % (ref 0.0–0.2)
PLATELETS: 167 10*3/uL (ref 150–400)
RBC: 3.72 MIL/uL — ABNORMAL LOW (ref 3.87–5.11)
RDW: 12.2 % (ref 11.5–15.5)
WBC: 3.1 10*3/uL — AB (ref 4.0–10.5)

## 2018-04-08 ENCOUNTER — Ambulatory Visit
Admission: RE | Admit: 2018-04-08 | Discharge: 2018-04-08 | Disposition: A | Discharge status: Home / Self Care | Payer: Managed Care, Other (non HMO) | Source: Ambulatory Visit | Attending: Radiation Oncology | Admitting: Radiation Oncology

## 2018-04-08 DIAGNOSIS — C50411 Malignant neoplasm of upper-outer quadrant of right female breast: Secondary | ICD-10-CM | POA: Diagnosis not present

## 2018-04-09 ENCOUNTER — Ambulatory Visit
Admission: RE | Admit: 2018-04-09 | Discharge: 2018-04-09 | Disposition: A | Discharge status: Home / Self Care | Payer: Managed Care, Other (non HMO) | Source: Ambulatory Visit | Attending: Radiation Oncology | Admitting: Radiation Oncology

## 2018-04-09 DIAGNOSIS — C50411 Malignant neoplasm of upper-outer quadrant of right female breast: Secondary | ICD-10-CM | POA: Diagnosis not present

## 2018-04-12 ENCOUNTER — Ambulatory Visit
Admission: RE | Admit: 2018-04-12 | Discharge: 2018-04-12 | Disposition: A | Discharge status: Home / Self Care | Payer: Managed Care, Other (non HMO) | Source: Ambulatory Visit | Attending: Radiation Oncology | Admitting: Radiation Oncology

## 2018-04-12 DIAGNOSIS — C50411 Malignant neoplasm of upper-outer quadrant of right female breast: Secondary | ICD-10-CM | POA: Diagnosis not present

## 2018-04-13 ENCOUNTER — Ambulatory Visit
Admission: RE | Admit: 2018-04-13 | Discharge: 2018-04-13 | Disposition: A | Discharge status: Home / Self Care | Payer: Managed Care, Other (non HMO) | Source: Ambulatory Visit | Attending: Radiation Oncology | Admitting: Radiation Oncology

## 2018-04-13 DIAGNOSIS — C50411 Malignant neoplasm of upper-outer quadrant of right female breast: Secondary | ICD-10-CM | POA: Diagnosis not present

## 2018-04-14 ENCOUNTER — Ambulatory Visit
Admission: RE | Admit: 2018-04-14 | Discharge: 2018-04-14 | Disposition: A | Discharge status: Home / Self Care | Payer: Managed Care, Other (non HMO) | Source: Ambulatory Visit | Attending: Radiation Oncology | Admitting: Radiation Oncology

## 2018-04-14 DIAGNOSIS — C50411 Malignant neoplasm of upper-outer quadrant of right female breast: Secondary | ICD-10-CM | POA: Diagnosis not present

## 2018-04-19 ENCOUNTER — Ambulatory Visit
Admission: RE | Admit: 2018-04-19 | Discharge: 2018-04-19 | Disposition: A | Discharge status: Home / Self Care | Payer: Managed Care, Other (non HMO) | Source: Ambulatory Visit | Attending: Radiation Oncology | Admitting: Radiation Oncology

## 2018-04-19 ENCOUNTER — Inpatient Hospital Stay (HOSPITAL_BASED_OUTPATIENT_CLINIC_OR_DEPARTMENT_OTHER): Payer: Managed Care, Other (non HMO) | Admitting: Internal Medicine

## 2018-04-19 ENCOUNTER — Encounter: Payer: Self-pay | Admitting: Internal Medicine

## 2018-04-19 VITALS — BP 114/64 | HR 53 | Resp 16 | Wt 152.4 lb

## 2018-04-19 DIAGNOSIS — G629 Polyneuropathy, unspecified: Secondary | ICD-10-CM

## 2018-04-19 DIAGNOSIS — C50411 Malignant neoplasm of upper-outer quadrant of right female breast: Secondary | ICD-10-CM | POA: Diagnosis present

## 2018-04-19 DIAGNOSIS — Z9221 Personal history of antineoplastic chemotherapy: Secondary | ICD-10-CM

## 2018-04-19 DIAGNOSIS — Z170 Estrogen receptor positive status [ER+]: Secondary | ICD-10-CM | POA: Insufficient documentation

## 2018-04-19 DIAGNOSIS — Z79899 Other long term (current) drug therapy: Secondary | ICD-10-CM

## 2018-04-19 NOTE — Progress Notes (Signed)
[place] OFFICE PROGRESS NOTE  Patient Care Team: Cletis Athens, MD as PCP - General (Internal Medicine)  Cancer Staging No matching staging information was found for the patient.   Oncology History   # May 2019- RIGHT BREAST CA s/p Lumpec & SLNBx [pT1c pN0 (sn); G-1;  margins clear; ER/PR > 90%; Her 2 neu- FISH-NEG. [place] [Dr.Byrnett]- HIGH RISK  # July 29th TC x4 [finished 02/15/2018]  GENETIC TESTING: [Dr.Byrnett] NEGATIVE FOR - ATM/ BRCA2/CHEK2/PTEN/TP53/BRCA1/CDH1/PALB2/ STK11   DIAGNOSIS: BREAST CA   STAGE:  I/mammaprint-H ;GOALS: cure  CURRENT/MOST RECENT THERAPY: on RT      Malignant neoplasm of upper-outer quadrant of right breast in female, estrogen receptor positive ([place])   11/03/2017 Initial Diagnosis    Malignant neoplasm of upper-outer quadrant of right breast in female, estrogen receptor positive ([place])       INTERVAL HISTORY:  Crystal Branch 47 y.o.  female pleasant patient above history of stage I breast cancer ER PR positive HER-2/neu negative high risk mamma print status post chemotherapy currently on adjuvant radiation is here for follow-up.  Patient continues to have mild fatigue patient also comes with mild tingling and numbness in activities.   Denies any diarrhea.  Denies any headaches.  Complains of mild soreness/rash at the site of radiation the left underarm.   Review of Systems  Constitutional: Positive for malaise/fatigue. Negative for chills, diaphoresis, fever and weight loss.  HENT: Negative for nosebleeds and sore throat.   Eyes: Negative for double vision.  Respiratory: Negative for cough, hemoptysis, sputum production, shortness of breath and wheezing.   Cardiovascular: Negative for chest pain, palpitations, orthopnea and leg swelling.  Gastrointestinal: Negative for abdominal pain, blood in stool, constipation, heartburn, melena, nausea and vomiting.  Genitourinary: Negative for dysuria, frequency and urgency.   Musculoskeletal: Negative for back pain and joint pain.  Skin: Positive for rash. Negative for itching.  Neurological: Positive for tingling. Negative for dizziness, focal weakness, weakness and headaches.  Endo/Heme/Allergies: Does not bruise/bleed easily.  Psychiatric/Behavioral: Negative for depression. The patient is not nervous/anxious and does not have insomnia.       PAST MEDICAL HISTORY :  Past Medical History:  Diagnosis Date  . BRCA negative 09/2016   [facility]  . Breast CA ([place]) 09/30/2017   INVASIVE MAMMARY CARCINOMA WITH MUCINOUS FEATURES/ ER/PR 90%; Her 2 neu: Negative.  Mammoprint: High risk.   Marland Kitchen Dysrhythmia     PAST SURGICAL HISTORY :   Past Surgical History:  Procedure Laterality Date  . 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 BREAST LUMPECTOMY Right 10/26/2017  . BREAST LUMPECTOMY WITH SENTINEL LYMPH NODE BIOPSY Right 10/26/2017   Procedure: BREAST LUMPECTOMY WITH SENTINEL LYMPH NODE BX;  Surgeon: Robert Bellow, MD;  Location: [facility] ORS;  Service: General;  Laterality: Right;  . TONSILLECTOMY  2015    FAMILY HISTORY :   Family History  Problem Relation Age of Onset  . Breast cancer Maternal Aunt 60  . Breast cancer Maternal Grandmother 20    SOCIAL HISTORY:   Social History   Tobacco Use  . Smoking status: Never Smoker  . Smokeless tobacco: Never Used  Substance Use Topics  . Alcohol use: Never    Frequency: Never  . Drug use: Never    ALLERGIES:  has No Known Allergies.  MEDICATIONS:  Current Outpatient Medications  Medication Sig Dispense Refill  . acetaminophen (TYLENOL) 325 MG tablet Take 325  mg by mouth every 6 (six) hours as needed (for pain).    Marland Kitchen dexamethasone (DECADRON) 4 MG tablet Take one pill AM & PM x 3 days; start the day prior to chemo. 60 tablet 0  . loratadine (CLARITIN) 10 MG tablet Take 1 tablet (10 mg total) by mouth daily.    . Misc Natural  Products (OSTEO BI-FLEX JOINT SHIELD PO) Take 1 capsule by mouth 2 (two) times daily.     . Multiple Vitamin (MULTIVITAMIN WITH MINERALS) TABS tablet Take 1 tablet by mouth daily.    . ondansetron (ZOFRAN) 8 MG tablet One pill every 8 hours as needed for nausea/vomitting. 40 tablet 1  . Probiotic Product ([place]) CAPS Take 1 capsule by mouth daily with supper.     . prochlorperazine (COMPAZINE) 10 MG tablet Take 1 tablet (10 mg total) by mouth every 6 (six) hours as needed for nausea or vomiting. 40 tablet 1   No current facility-administered medications for this visit.     PHYSICAL EXAMINATION: ECOG PERFORMANCE STATUS: 0 - Asymptomatic  BP 114/64 (BP Location: Left Arm, Patient Position: Sitting)   Pulse (!) 53   Resp 16   Wt 152 lb 6.4 oz (69.1 kg)   BMI 27.00 kg/m   Filed Weights   04/19/18 1030  Weight: 152 lb 6.4 oz (69.1 kg)   Physical Exam  Constitutional: She is oriented to person, place, and time and well-developed, well-nourished, and in no distress.  Alone.  Walking by self.  HENT:  Head: Normocephalic and atraumatic.  Mouth/Throat: Oropharynx is clear and moist. No oropharyngeal exudate.  Eyes: Pupils are equal, round, and reactive to light.  Neck: Normal range of motion. Neck supple.  Cardiovascular: Normal rate and regular rhythm.  Pulmonary/Chest: No respiratory distress. She has no wheezes.  Abdominal: Soft. Bowel sounds are normal. She exhibits no distension and no mass. There is no tenderness. There is no rebound and no guarding.  Musculoskeletal: Normal range of motion. She exhibits no edema or tenderness.  Neurological: She is alert and oriented to person, place, and time.  Skin: Skin is warm.  Macular rash noted in the area of radiation approximately 2 x 2 cm in the right underarm.  Psychiatric: Affect normal.      LABORATORY DATA:  I have reviewed the data as listed    Component Value Date/Time   NA 142 03/08/2018 0912   NA 143  10/13/2017 0920   NA 142 06/19/2011 2227   K 4.3 03/08/2018 0912   K 3.8 06/19/2011 2227   CL 107 03/08/2018 0912   CL 107 06/19/2011 2227   CO2 31 03/08/2018 0912   CO2 21 06/19/2011 2227   GLUCOSE 94 03/08/2018 0912   GLUCOSE 103 (H) 06/19/2011 2227   BUN 17 03/08/2018 0912   BUN 13 10/13/2017 0920   BUN 10 06/19/2011 2227   CREATININE 0.50 03/08/2018 0912   CREATININE 0.75 06/19/2011 2227   CALCIUM 9.2 03/08/2018 0912   CALCIUM 9.5 06/19/2011 2227   PROT 6.4 (L) 02/15/2018 0749   PROT 6.5 10/13/2017 0920   ALBUMIN 4.0 02/15/2018 0749   ALBUMIN 4.6 10/13/2017 0920   AST 26 02/15/2018 0749   ALT 20 02/15/2018 0749   ALKPHOS 66 02/15/2018 0749   BILITOT 0.7 02/15/2018 0749   BILITOT 0.4 10/13/2017 0920   GFRNONAA >60 03/08/2018 0912   GFRNONAA >60 06/19/2011 2227   GFRAA >60 03/08/2018 0912   GFRAA >60 06/19/2011 2227    No  results found for: SPEP, UPEP  Lab Results  Component Value Date   WBC 3.1 (L) 04/07/2018   NEUTROABS 5.3 03/08/2018   HGB 12.3 04/07/2018   HCT 38.3 04/07/2018   MCV 103.0 (H) 04/07/2018   PLT 167 04/07/2018      Chemistry      Component Value Date/Time   NA 142 03/08/2018 0912   NA 143 10/13/2017 0920   NA 142 06/19/2011 2227   K 4.3 03/08/2018 0912   K 3.8 06/19/2011 2227   CL 107 03/08/2018 0912   CL 107 06/19/2011 2227   CO2 31 03/08/2018 0912   CO2 21 06/19/2011 2227   BUN 17 03/08/2018 0912   BUN 13 10/13/2017 0920   BUN 10 06/19/2011 2227   CREATININE 0.50 03/08/2018 0912   CREATININE 0.75 06/19/2011 2227      Component Value Date/Time   CALCIUM 9.2 03/08/2018 0912   CALCIUM 9.5 06/19/2011 2227   ALKPHOS 66 02/15/2018 0749   AST 26 02/15/2018 0749   ALT 20 02/15/2018 0749   BILITOT 0.7 02/15/2018 0749   BILITOT 0.4 10/13/2017 0920       RADIOGRAPHIC STUDIES: I have personally reviewed the radiological images as listed and agreed with the findings in the report. No results found.   ASSESSMENT & PLAN:  Malignant  neoplasm of upper-outer quadrant of right breast in female, estrogen receptor positive ([place]) #Stage I ER PR positive HER-2 negative; HIGH risk mamma print.  Status post adjuvant chemotherapy.  Currently on radiation.  Stable.  #Discussed the role of tamoxifen post radiation.  #Discussed the role of anti-hormonal therapy mechanism of action; since patient is premenopausal recommend tamoxifen.  I would recommend tamoxifen for 5 years.  Long discussion regarding the potential adverse events on tamoxifen including but not limited to hot flashes, mood swings, thromboembolic events strokes and also small risk of uterine cancers.  Also discussed the drug interaction with older SSRI. Will prescribe at next visit.   # RT deramtitis-recommend Aqaphor.  Radiation until December 11th.    # Peripheral neuropathy- G-1; stable  # DISPOSITION: # Follow up in 6 weeks; MD- cbc/cmp-Dr.B     Orders Placed This Encounter  Procedures  . CBC with Differential/Platelet    Standing Status:   Future    Standing Expiration Date:   04/20/2019  . Comprehensive metabolic panel    Standing Status:   Future    Standing Expiration Date:   04/20/2019  . CBC with Differential/Platelet    Standing Status:   Future    Standing Expiration Date:   04/20/2019  . Comprehensive metabolic panel    Standing Status:   Future    Standing Expiration Date:   04/20/2019   All questions were answered. The patient knows to call the clinic with any problems, questions or concerns.      Cammie Sickle, MD 04/19/2018 4:53 PM

## 2018-04-19 NOTE — Assessment & Plan Note (Addendum)
#  Stage I ER PR positive HER-2 negative; HIGH risk mamma print.  Status post adjuvant chemotherapy.  Currently on radiation.  Stable.  #Discussed the role of tamoxifen post radiation.  #Discussed the role of anti-hormonal therapy mechanism of action; since patient is premenopausal recommend tamoxifen.  I would recommend tamoxifen for 5 years.  Long discussion regarding the potential adverse events on tamoxifen including but not limited to hot flashes, mood swings, thromboembolic events strokes and also small risk of uterine cancers.  Also discussed the drug interaction with older SSRI. Will prescribe at next visit.   # RT deramtitis-recommend Aqaphor.  Radiation until December 11th.    # Peripheral neuropathy- G-1; stable  # DISPOSITION: # Follow up in 6 weeks; MD- cbc/cmp-Dr.B

## 2018-04-20 ENCOUNTER — Ambulatory Visit
Admission: RE | Admit: 2018-04-20 | Discharge: 2018-04-20 | Disposition: A | Discharge status: Home / Self Care | Payer: Managed Care, Other (non HMO) | Source: Ambulatory Visit | Attending: Radiation Oncology | Admitting: Radiation Oncology

## 2018-04-20 DIAGNOSIS — C50411 Malignant neoplasm of upper-outer quadrant of right female breast: Secondary | ICD-10-CM | POA: Diagnosis not present

## 2018-04-21 ENCOUNTER — Other Ambulatory Visit: Payer: Self-pay

## 2018-04-21 ENCOUNTER — Ambulatory Visit
Admission: RE | Admit: 2018-04-21 | Discharge: 2018-04-21 | Disposition: A | Discharge status: Home / Self Care | Payer: Managed Care, Other (non HMO) | Source: Ambulatory Visit | Attending: Radiation Oncology | Admitting: Radiation Oncology

## 2018-04-21 ENCOUNTER — Inpatient Hospital Stay: Payer: Managed Care, Other (non HMO)

## 2018-04-21 DIAGNOSIS — Z170 Estrogen receptor positive status [ER+]: Principal | ICD-10-CM

## 2018-04-21 DIAGNOSIS — C50411 Malignant neoplasm of upper-outer quadrant of right female breast: Secondary | ICD-10-CM | POA: Diagnosis not present

## 2018-04-21 LAB — CBC
HEMATOCRIT: 37.1 % (ref 36.0–46.0)
Hemoglobin: 12.2 g/dL (ref 12.0–15.0)
MCH: 33.2 pg (ref 26.0–34.0)
MCHC: 32.9 g/dL (ref 30.0–36.0)
MCV: 100.8 fL — ABNORMAL HIGH (ref 80.0–100.0)
NRBC: 0 % (ref 0.0–0.2)
Platelets: 164 10*3/uL (ref 150–400)
RBC: 3.68 MIL/uL — ABNORMAL LOW (ref 3.87–5.11)
RDW: 11.8 % (ref 11.5–15.5)
WBC: 2.7 10*3/uL — ABNORMAL LOW (ref 4.0–10.5)

## 2018-04-22 ENCOUNTER — Ambulatory Visit
Admission: RE | Admit: 2018-04-22 | Discharge: 2018-04-22 | Disposition: A | Discharge status: Home / Self Care | Payer: Managed Care, Other (non HMO) | Source: Ambulatory Visit | Attending: Radiation Oncology | Admitting: Radiation Oncology

## 2018-04-22 DIAGNOSIS — C50411 Malignant neoplasm of upper-outer quadrant of right female breast: Secondary | ICD-10-CM | POA: Diagnosis not present

## 2018-04-23 ENCOUNTER — Ambulatory Visit
Admission: RE | Admit: 2018-04-23 | Discharge: 2018-04-23 | Disposition: A | Discharge status: Home / Self Care | Payer: Managed Care, Other (non HMO) | Source: Ambulatory Visit | Attending: Radiation Oncology | Admitting: Radiation Oncology

## 2018-04-23 DIAGNOSIS — C50411 Malignant neoplasm of upper-outer quadrant of right female breast: Secondary | ICD-10-CM | POA: Diagnosis not present

## 2018-04-26 ENCOUNTER — Ambulatory Visit
Admission: RE | Admit: 2018-04-26 | Discharge: 2018-04-26 | Disposition: A | Discharge status: Home / Self Care | Payer: Managed Care, Other (non HMO) | Source: Ambulatory Visit | Attending: Radiation Oncology | Admitting: Radiation Oncology

## 2018-04-26 DIAGNOSIS — C50411 Malignant neoplasm of upper-outer quadrant of right female breast: Secondary | ICD-10-CM | POA: Diagnosis not present

## 2018-04-27 ENCOUNTER — Other Ambulatory Visit: Payer: Self-pay

## 2018-04-27 ENCOUNTER — Ambulatory Visit
Admission: RE | Admit: 2018-04-27 | Discharge: 2018-04-27 | Disposition: A | Discharge status: Home / Self Care | Payer: Managed Care, Other (non HMO) | Source: Ambulatory Visit | Attending: Radiation Oncology | Admitting: Radiation Oncology

## 2018-04-27 ENCOUNTER — Encounter: Payer: Self-pay | Admitting: General Surgery

## 2018-04-27 ENCOUNTER — Ambulatory Visit: Payer: Managed Care, Other (non HMO) | Admitting: General Surgery

## 2018-04-27 VITALS — BP 116/77 | HR 61 | Temp 97.7°F | Resp 12 | Ht 63.0 in | Wt 151.0 lb

## 2018-04-27 DIAGNOSIS — Z170 Estrogen receptor positive status [ER+]: Secondary | ICD-10-CM

## 2018-04-27 DIAGNOSIS — C50411 Malignant neoplasm of upper-outer quadrant of right female breast: Secondary | ICD-10-CM

## 2018-04-27 NOTE — Patient Instructions (Addendum)
The patient is aware to call back for any questions or concerns. The patient has been asked to return to the office in 5 months with a bilateral diagnostic mammogram.  Recommend colonoscopy 2020

## 2018-04-27 NOTE — Progress Notes (Signed)
Patient ID: Crystal Branch, female   DOB: 1971-03-22, 47 y.o.   MRN: [id number]  Chief Complaint  Patient presents with  . Follow-up    HPI Crystal Branch is a 47 y.o. female.  Here for follow up right breast cancer, right lumpectomy on 10-26-17. She will complete radiation this week. No new breast issues.   HPI  Past Medical History:  Diagnosis Date  . BRCA negative 09/2016   [facility]  . Breast CA ([place]) 09/30/2017   INVASIVE MAMMARY CARCINOMA WITH MUCINOUS FEATURES/ ER/PR 90%; Her 2 neu: Negative.  Mammoprint: High risk.   Marland Kitchen Dysrhythmia     Past Surgical History:  Procedure Laterality Date  . 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 BREAST LUMPECTOMY Right 10/26/2017  . BREAST LUMPECTOMY WITH SENTINEL LYMPH NODE BIOPSY Right 10/26/2017   Procedure: BREAST LUMPECTOMY WITH SENTINEL LYMPH NODE BX;  Surgeon: Robert Bellow, MD;  Location: [facility] ORS;  Service: General;  Laterality: Right;  . TONSILLECTOMY  2015    Family History  Problem Relation Age of Onset  . Breast cancer Maternal Aunt 60  . Breast cancer Maternal Grandmother 39  . Colon cancer Mother 52    Social History Social History   Tobacco Use  . Smoking status: Never Smoker  . Smokeless tobacco: Never Used  Substance Use Topics  . Alcohol use: Never    Frequency: Never  . Drug use: Never    No Known Allergies  Current Outpatient Medications  Medication Sig Dispense Refill  . acetaminophen (TYLENOL) 325 MG tablet Take 325 mg by mouth every 6 (six) hours as needed (for pain).    Marland Kitchen loratadine (CLARITIN) 10 MG tablet Take 1 tablet (10 mg total) by mouth daily.    . Misc Natural Products (OSTEO BI-FLEX JOINT SHIELD PO) Take 1 capsule by mouth 2 (two) times daily.     . Multiple Vitamin (MULTIVITAMIN WITH MINERALS) TABS tablet Take 1 tablet by mouth daily.    . Probiotic Product ([place]) CAPS Take 1 capsule by mouth daily  with supper.      No current facility-administered medications for this visit.     Review of Systems Review of Systems  Constitutional: Negative.   Respiratory: Negative.   Cardiovascular: Negative.     Blood pressure 116/77, pulse 61, temperature 97.7 F (36.5 C), temperature source Skin, resp. rate 12, height 5' 3" (1.6 m), weight 151 lb (68.5 kg), SpO2 96 %.  Physical Exam Physical Exam  Constitutional: She is oriented to person, place, and time. She appears well-developed and well-nourished.  HENT:  Mouth/Throat: Oropharynx is clear and moist.  Eyes: Conjunctivae are normal.  Neck: Neck supple.  Cardiovascular: Normal rate, regular rhythm and normal heart sounds.  Pulmonary/Chest: Effort normal and breath sounds normal. Right breast exhibits skin change. Right breast exhibits no inverted nipple, no mass, no nipple discharge and no tenderness. Left breast exhibits no inverted nipple, no mass, no nipple discharge, no skin change and no tenderness.  Right lumpectomy incision well healed,  radiation changes.    Neurological: She is alert and oriented to person, place, and time.  Skin: Skin is warm and dry.  Psychiatric: Her behavior is normal.    Data Reviewed New family history obtained relating to the mother's development of colon cancer in her mid 30s.  Assessment    Doing well status post whole breast radiation.  Family history of colon  cancer.    Plan    We discussed the recent recommendations from the [employer] for screening colonoscopy beginning at age 45.  With her mother's family history she should consider this certainly by age 50.    Recommend colonoscopy 2020.  The patient has been asked to return to the office in 5 months with a bilateral diagnostic mammogram.      HPI, Physical Exam, Assessment and Plan have been scribed under the direction and in the presence of Jeffrey W. Byrnett, MD. Marsha Hatch, RN  I have completed the exam and  reviewed the above documentation for accuracy and completeness.  I agree with the above.  [employer] has been used and any errors in dictation or transcription are unintentional.  Jeffrey Byrnett, M.D., F.A.C.S.  Jeffrey W Byrnett 04/28/2018, 5:38 AM   

## 2018-04-28 ENCOUNTER — Ambulatory Visit
Admission: RE | Admit: 2018-04-28 | Discharge: 2018-04-28 | Disposition: A | Discharge status: Home / Self Care | Payer: Managed Care, Other (non HMO) | Source: Ambulatory Visit | Attending: Radiation Oncology | Admitting: Radiation Oncology

## 2018-04-28 DIAGNOSIS — C50411 Malignant neoplasm of upper-outer quadrant of right female breast: Secondary | ICD-10-CM | POA: Diagnosis not present

## 2018-05-31 ENCOUNTER — Encounter: Payer: Self-pay | Admitting: Internal Medicine

## 2018-05-31 ENCOUNTER — Inpatient Hospital Stay: Payer: Managed Care, Other (non HMO)

## 2018-05-31 ENCOUNTER — Ambulatory Visit
Admission: RE | Admit: 2018-05-31 | Discharge: 2018-05-31 | Disposition: A | Discharge status: Home / Self Care | Payer: Managed Care, Other (non HMO) | Source: Ambulatory Visit | Attending: Radiation Oncology | Admitting: Radiation Oncology

## 2018-05-31 ENCOUNTER — Encounter: Payer: Self-pay | Admitting: Radiation Oncology

## 2018-05-31 ENCOUNTER — Inpatient Hospital Stay: Payer: Managed Care, Other (non HMO) | Attending: Internal Medicine

## 2018-05-31 ENCOUNTER — Other Ambulatory Visit: Payer: Self-pay

## 2018-05-31 ENCOUNTER — Inpatient Hospital Stay: Payer: Managed Care, Other (non HMO) | Admitting: Internal Medicine

## 2018-05-31 VITALS — BP 105/65 | HR 54 | Temp 97.4°F | Resp 16 | Wt 148.6 lb

## 2018-05-31 VITALS — BP 105/66 | HR 56 | Temp 96.5°F | Resp 16 | Wt 149.3 lb

## 2018-05-31 DIAGNOSIS — Z803 Family history of malignant neoplasm of breast: Secondary | ICD-10-CM | POA: Diagnosis not present

## 2018-05-31 DIAGNOSIS — C50411 Malignant neoplasm of upper-outer quadrant of right female breast: Secondary | ICD-10-CM | POA: Diagnosis present

## 2018-05-31 DIAGNOSIS — G629 Polyneuropathy, unspecified: Secondary | ICD-10-CM | POA: Insufficient documentation

## 2018-05-31 DIAGNOSIS — Z170 Estrogen receptor positive status [ER+]: Secondary | ICD-10-CM | POA: Insufficient documentation

## 2018-05-31 DIAGNOSIS — Z79810 Long term (current) use of selective estrogen receptor modulators (SERMs): Secondary | ICD-10-CM

## 2018-05-31 DIAGNOSIS — Z79899 Other long term (current) drug therapy: Secondary | ICD-10-CM | POA: Diagnosis not present

## 2018-05-31 DIAGNOSIS — Z800 Family history of malignant neoplasm of digestive organs: Secondary | ICD-10-CM

## 2018-05-31 LAB — COMPREHENSIVE METABOLIC PANEL
ALBUMIN: 4.7 g/dL (ref 3.5–5.0)
ALK PHOS: 70 U/L (ref 38–126)
ALT: 21 U/L (ref 0–44)
AST: 23 U/L (ref 15–41)
Anion gap: 7 (ref 5–15)
BUN: 16 mg/dL (ref 6–20)
CALCIUM: 9.6 mg/dL (ref 8.9–10.3)
CO2: 32 mmol/L (ref 22–32)
Chloride: 104 mmol/L (ref 98–111)
Creatinine, Ser: 0.55 mg/dL (ref 0.44–1.00)
GFR calc Af Amer: >60 mL/min (ref >60)
GFR calc non Af Amer: >60 mL/min (ref >60)
GLUCOSE: 89 mg/dL (ref 70–99)
Potassium: 4 mmol/L (ref 3.5–5.1)
SODIUM: 143 mmol/L (ref 135–145)
Total Bilirubin: 0.7 mg/dL (ref 0.3–1.2)
Total Protein: 7.4 g/dL (ref 6.5–8.1)

## 2018-05-31 LAB — CBC WITH DIFFERENTIAL/PLATELET
ABS IMMATURE GRANULOCYTES: 0.01 10*3/uL (ref 0.00–0.07)
Basophils Absolute: 0 10*3/uL (ref 0.0–0.1)
Basophils Relative: 1 %
EOS ABS: 0 10*3/uL (ref 0.0–0.5)
Eosinophils Relative: 1 %
HEMATOCRIT: 40.4 % (ref 36.0–46.0)
HEMOGLOBIN: 13.2 g/dL (ref 12.0–15.0)
Immature Granulocytes: 0 %
LYMPHS ABS: 0.7 10*3/uL (ref 0.7–4.0)
LYMPHS PCT: 24 %
MCH: 31.8 pg (ref 26.0–34.0)
MCHC: 32.7 g/dL (ref 30.0–36.0)
MCV: 97.3 fL (ref 80.0–100.0)
MONOS PCT: 8 %
Monocytes Absolute: 0.2 10*3/uL (ref 0.1–1.0)
NEUTROS ABS: 1.9 10*3/uL (ref 1.7–7.7)
NEUTROS PCT: 66 %
Platelets: 172 10*3/uL (ref 150–400)
RBC: 4.15 MIL/uL (ref 3.87–5.11)
RDW: 11.5 % (ref 11.5–15.5)
WBC: 2.8 10*3/uL — ABNORMAL LOW (ref 4.0–10.5)
nRBC: 0 % (ref 0.0–0.2)

## 2018-05-31 MED ORDER — TAMOXIFEN CITRATE 20 MG PO TABS: 20.0000 mg | ORAL_TABLET | Freq: Every day | ORAL | 2 refills | Status: DC

## 2018-05-31 NOTE — Progress Notes (Signed)
Survivorship Care Plan visit completed.  Treatment summary reviewed and given to patient.  ASCO answers booklet reviewed and given to patient.  CARE program and Cancer Transitions discussed with patient along with other resources cancer center offers to patients and caregivers.  Patient verbalized understanding.    

## 2018-05-31 NOTE — Progress Notes (Signed)
Radiation Oncology Follow up Note  Name: Crystal Branch   Date:   05/31/2018 MRN:  [id number] DOB: 03/02/1971    This 47 y.o. 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 She is seen today in routine follow-up and is doing well. She specifically denies breast tenderness cough or bone pain. She's not yet started antiestrogen therapy is seeing medical oncology today..  COMPLICATIONS OF TREATMENT: none  FOLLOW UP COMPLIANCE: keeps appointments   PHYSICAL EXAM:  BP 105/66 (BP Location: Left Arm, Patient Position: Sitting)   Pulse (!) 56   Temp (!) 96.5 F (35.8 C) (Tympanic)   Resp 16   Wt 149 lb 4 oz (67.7 kg)   BMI 26.44 kg/m  Lungs are clear to A&P cardiac examination essentially unremarkable with regular rate and rhythm. No dominant mass or nodularity is noted in either breast in 2 positions examined. Incision is well-healed. No axillary or supraclavicular adenopathy is appreciated. Cosmetic result is excellent.Well-developed well-nourished patient in NAD. HEENT reveals PERLA, EOMI, discs not visualized.  Oral cavity is clear. No oral mucosal lesions are identified. Neck is clear without evidence of cervical or supraclavicular adenopathy. Lungs are clear to A&P. Cardiac examination is essentially unremarkable with regular rate and rhythm without murmur rub or thrill. Abdomen is benign with no organomegaly or masses noted. Motor sensory and DTR levels are equal and symmetric in the upper and lower extremities. Cranial nerves II through XII are grossly intact.  Proprioception is intact. No peripheral adenopathy or edema is identified. No motor or sensory levels are noted. Crude visual fields are within normal range.  RADIOLOGY RESULTS: no current films for review  PLAN: present time patient is doing well with no evidence of disease. I'm please were overall progress. I've asked to see her back in 4-5 months for follow-up. She probably will start antiestrogen therapy after seeing medical oncology today patient is to call with any concerns.  I would like to take this opportunity to thank you for allowing me to participate in the care of your patient..    Glenn Chrystal, MD   

## 2018-05-31 NOTE — Progress Notes (Signed)
[place] OFFICE PROGRESS NOTE  Patient Care Team: Cletis Athens, MD as PCP - General (Internal Medicine) Bary Castilla, Forest Gleason, MD as Surgeon (General Surgery) Rico Junker, RN as Oncology Nurse Navigator Noreene Filbert, MD as Referring Physician (Radiation Oncology) Cammie Sickle, MD as Consulting Physician (Internal Medicine) Lequita Asal, MD as Referring Physician (Hematology and Oncology)  Cancer Staging No matching staging information was found for the patient.   Oncology History   # May 2019- RIGHT BREAST CA s/p Lumpec & SLNBx [pT1c pN0 (sn); G-1;  margins clear; ER/PR > 90%; Her 2 neu- FISH-NEG. MAMMAPRINT [Dr.Byrnett]- HIGH RISK  # July 29th TC x4 [finished 02/15/2018] s/p RT [dec 11th 2019]; # jan 13th 2020- start Tam  GENETIC TESTING: [Dr.Byrnett] NEGATIVE FOR - ATM/ BRCA2/CHEK2/PTEN/TP53/BRCA1/CDH1/PALB2/ STK11   DIAGNOSIS: BREAST CA   STAGE:  I/mammaprint-H ;GOALS: cure  CURRENT/MOST RECENT THERAPY: Tam      Malignant neoplasm of upper-outer quadrant of right breast in female, estrogen receptor positive ([place])   11/03/2017 Initial Diagnosis    Malignant neoplasm of upper-outer quadrant of right breast in female, estrogen receptor positive ([place]       INTERVAL HISTORY:  Crystal Branch 48 y.o.  female pleasant patient above history of stage I breast cancer ER PR positive HER-2/neu negative high risk mamma print status post chemotherapy currently status post adjuvant radiation is here for follow-up.  Patient complains of mild fatigue otherwise doing well.  No shortness of breath no cough.  Radiation skin changes improved.  Complains of mild tingling and numbness in the hands.  Otherwise  Complains of mild aches and pains in the joints.  Review of Systems  Constitutional: Positive for malaise/fatigue. Negative for chills, diaphoresis, fever and weight loss.  HENT: Negative for nosebleeds and sore throat.   Eyes: Negative for double  vision.  Respiratory: Negative for cough, hemoptysis, sputum production, shortness of breath and wheezing.   Cardiovascular: Negative for chest pain, palpitations, orthopnea and leg swelling.  Gastrointestinal: Negative for abdominal pain, blood in stool, constipation, heartburn, melena, nausea and vomiting.  Genitourinary: Negative for dysuria, frequency and urgency.  Musculoskeletal: Negative for back pain and joint pain.  Skin: Negative for itching.  Neurological: Positive for tingling. Negative for dizziness, focal weakness, weakness and headaches.  Endo/Heme/Allergies: Does not bruise/bleed easily.  Psychiatric/Behavioral: Negative for depression. The patient is not nervous/anxious and does not have insomnia.       PAST MEDICAL HISTORY :  Past Medical History:  Diagnosis Date  . BRCA negative 09/2016   [facility]  . Breast CA ([place]) 09/30/2017   INVASIVE MAMMARY CARCINOMA WITH MUCINOUS FEATURES/ ER/PR 90%; Her 2 neu: Negative.  Mammoprint: High risk.   Marland Kitchen Dysrhythmia     PAST SURGICAL HISTORY :   Past Surgical History:  Procedure Laterality Date  . 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 BREAST LUMPECTOMY Right 10/26/2017  . BREAST LUMPECTOMY WITH SENTINEL LYMPH NODE BIOPSY Right 10/26/2017   Procedure: BREAST LUMPECTOMY WITH SENTINEL LYMPH NODE BX;  Surgeon: Robert Bellow, MD;  Location: [facility] ORS;  Service: General;  Laterality: Right;  . TONSILLECTOMY  2015    FAMILY HISTORY :   Family History  Problem Relation Age of Onset  . Breast cancer Maternal Aunt 60  . Breast cancer Maternal Grandmother 47  . Colon cancer Mother 7    SOCIAL HISTORY:   Social History   Tobacco Use  .  Smoking status: Never Smoker  . Smokeless tobacco: Never Used  Substance Use Topics  . Alcohol use: Never    Frequency: Never  . Drug use: Never    ALLERGIES:  has No Known Allergies.  MEDICATIONS:   Current Outpatient Medications  Medication Sig Dispense Refill  . acetaminophen (TYLENOL) 325 MG tablet Take 325 mg by mouth every 6 (six) hours as needed (for pain).    Marland Kitchen loratadine (CLARITIN) 10 MG tablet Take 1 tablet (10 mg total) by mouth daily.    . Misc Natural Products (OSTEO BI-FLEX JOINT SHIELD PO) Take 1 capsule by mouth 2 (two) times daily.     . Multiple Vitamin (MULTIVITAMIN WITH MINERALS) TABS tablet Take 1 tablet by mouth daily.    . Probiotic Product ([place]) CAPS Take 1 capsule by mouth daily with supper.     . tamoxifen (NOLVADEX) 20 MG tablet Take 1 tablet (20 mg total) by mouth daily. 90 tablet 2   No current facility-administered medications for this visit.     PHYSICAL EXAMINATION: ECOG PERFORMANCE STATUS: 0 - Asymptomatic  BP 105/65 (BP Location: Left Arm, Patient Position: Sitting, Cuff Size: Normal)   Pulse (!) 54   Temp (!) 97.4 F (36.3 C) (Tympanic)   Resp 16   Wt 148 lb 9.6 oz (67.4 kg)   BMI 26.32 kg/m   Filed Weights   05/31/18 1108  Weight: 148 lb 9.6 oz (67.4 kg)   Physical Exam  Constitutional: She is oriented to person, place, and time and well-developed, well-nourished, and in no distress.  Alone.  Walking by self.  HENT:  Head: Normocephalic and atraumatic.  Mouth/Throat: Oropharynx is clear and moist. No oropharyngeal exudate.  Eyes: Pupils are equal, round, and reactive to light.  Neck: Normal range of motion. Neck supple.  Cardiovascular: Normal rate and regular rhythm.  Pulmonary/Chest: No respiratory distress. She has no wheezes.  Abdominal: Soft. Bowel sounds are normal. She exhibits no distension and no mass. There is no abdominal tenderness. There is no rebound and no guarding.  Musculoskeletal: Normal range of motion.        General: No tenderness or edema.  Neurological: She is alert and oriented to person, place, and time.  Skin: Skin is warm.  Macular rash noted in the area of radiation approximately 2 x 2  cm in the right underarm.  Psychiatric: Affect normal.      LABORATORY DATA:  I have reviewed the data as listed    Component Value Date/Time   NA 143 05/31/2018 1006   NA 143 10/13/2017 0920   NA 142 06/19/2011 2227   K 4.0 05/31/2018 1006   K 3.8 06/19/2011 2227   CL 104 05/31/2018 1006   CL 107 06/19/2011 2227   CO2 32 05/31/2018 1006   CO2 21 06/19/2011 2227   GLUCOSE 89 05/31/2018 1006   GLUCOSE 103 (H) 06/19/2011 2227   BUN 16 05/31/2018 1006   BUN 13 10/13/2017 0920   BUN 10 06/19/2011 2227   CREATININE 0.55 05/31/2018 1006   CREATININE 0.75 06/19/2011 2227   CALCIUM 9.6 05/31/2018 1006   CALCIUM 9.5 06/19/2011 2227   PROT 7.4 05/31/2018 1006   PROT 6.5 10/13/2017 0920   ALBUMIN 4.7 05/31/2018 1006   ALBUMIN 4.6 10/13/2017 0920   AST 23 05/31/2018 1006   ALT 21 05/31/2018 1006   ALKPHOS 70 05/31/2018 1006   BILITOT 0.7 05/31/2018 1006   BILITOT 0.4 10/13/2017 0920   GFRNONAA >60 05/31/2018  [place] 06/19/2011 2227   GFRAA >60 05/31/2018 1006   GFRAA >60 06/19/2011 2227    No results found for: SPEP, UPEP  Lab Results  Component Value Date   WBC 2.8 (L) 05/31/2018   NEUTROABS 1.9 05/31/2018   HGB 13.2 05/31/2018   HCT 40.4 05/31/2018   MCV 97.3 05/31/2018   PLT 172 05/31/2018      Chemistry      Component Value Date/Time   NA 143 05/31/2018 1006   NA 143 10/13/2017 0920   NA 142 06/19/2011 2227   K 4.0 05/31/2018 1006   K 3.8 06/19/2011 2227   CL 104 05/31/2018 1006   CL 107 06/19/2011 2227   CO2 32 05/31/2018 1006   CO2 21 06/19/2011 2227   BUN 16 05/31/2018 1006   BUN 13 10/13/2017 0920   BUN 10 06/19/2011 2227   CREATININE 0.55 05/31/2018 1006   CREATININE 0.75 06/19/2011 2227      Component Value Date/Time   CALCIUM 9.6 05/31/2018 1006   CALCIUM 9.5 06/19/2011 2227   ALKPHOS 70 05/31/2018 1006   AST 23 05/31/2018 1006   ALT 21 05/31/2018 1006   BILITOT 0.7 05/31/2018 1006   BILITOT 0.4 10/13/2017 0920        RADIOGRAPHIC STUDIES: I have personally reviewed the radiological images as listed and agreed with the findings in the report. No results found.   ASSESSMENT & PLAN:  Malignant neoplasm of upper-outer quadrant of right breast in female, estrogen receptor positive ([place]) #Stage I ER PR positive HER-2 negative; HIGH risk mamma print.  Status post adjuvant chemotherapy/radiation.  #Proceed with adjuvant tamoxifen; again reviewed the potential side effects including but not limited to mood swings weight gain hot flashes small risk of uterine cancer.  Will discuss adjuvant bisphosphonate.  # After lengthy discussion weighing risk versus benefits patient agrees to proceed with treatment as discussed above.   # RT deramtitis-status post Aqaphor currently resolved.  # Peripheral neuropathy- G-1; stable  # DISPOSITION: # Follow up in 4 weeks; MD-no labs-Dr.B   No orders of the defined types were placed in this encounter.  All questions were answered. The patient knows to call the clinic with any problems, questions or concerns.      Cammie Sickle, MD 05/31/2018 1:28 PM

## 2018-05-31 NOTE — Assessment & Plan Note (Addendum)
#  Stage I ER PR positive HER-2 negative; HIGH risk mamma print.  Status post adjuvant chemotherapy/radiation.  #Proceed with adjuvant tamoxifen; again reviewed the potential side effects including but not limited to mood swings weight gain hot flashes small risk of uterine cancer.  Will discuss adjuvant bisphosphonate.  # After lengthy discussion weighing risk versus benefits patient agrees to proceed with treatment as discussed above.   # RT deramtitis-status post Aqaphor currently resolved.  # Peripheral neuropathy- G-1; stable  # DISPOSITION: # Follow up in 4 weeks; MD-no labs-Dr.B

## 2018-06-09 ENCOUNTER — Other Ambulatory Visit: Payer: Self-pay | Admitting: *Deleted

## 2018-06-09 MED ORDER — TAMOXIFEN CITRATE 20 MG PO TABS: 20.0000 mg | ORAL_TABLET | Freq: Every day | ORAL | 2 refills | Status: DC

## 2018-06-28 ENCOUNTER — Inpatient Hospital Stay: Payer: Managed Care, Other (non HMO) | Attending: Internal Medicine | Admitting: Internal Medicine

## 2018-06-28 ENCOUNTER — Encounter: Payer: Self-pay | Admitting: Internal Medicine

## 2018-06-28 VITALS — BP 103/68 | HR 60 | Temp 97.1°F | Resp 16 | Wt 153.8 lb

## 2018-06-28 DIAGNOSIS — G629 Polyneuropathy, unspecified: Secondary | ICD-10-CM

## 2018-06-28 DIAGNOSIS — N951 Menopausal and female climacteric states: Secondary | ICD-10-CM | POA: Insufficient documentation

## 2018-06-28 DIAGNOSIS — Z170 Estrogen receptor positive status [ER+]: Secondary | ICD-10-CM | POA: Diagnosis not present

## 2018-06-28 DIAGNOSIS — C50411 Malignant neoplasm of upper-outer quadrant of right female breast: Secondary | ICD-10-CM | POA: Insufficient documentation

## 2018-06-28 DIAGNOSIS — Z79810 Long term (current) use of selective estrogen receptor modulators (SERMs): Secondary | ICD-10-CM | POA: Diagnosis not present

## 2018-06-28 NOTE — Progress Notes (Signed)
[place] OFFICE PROGRESS NOTE  Patient Care Team: Cletis Athens, MD as PCP - General (Internal Medicine) Bary Castilla, Forest Gleason, MD as Surgeon (General Surgery) Rico Junker, RN as Oncology Nurse Navigator Noreene Filbert, MD as Referring Physician (Radiation Oncology) Cammie Sickle, MD as Consulting Physician (Internal Medicine) Lequita Asal, MD as Referring Physician (Hematology and Oncology)  Cancer Staging No matching staging information was found for the patient.   Oncology History   # May 2019- RIGHT BREAST CA s/p Lumpec & SLNBx [pT1c pN0 (sn); G-1;  margins clear; ER/PR > 90%; Her 2 neu- FISH-NEG. MAMMAPRINT [Dr.Byrnett]- HIGH RISK  # July 29th TC x4 [finished 02/15/2018] s/p RT [dec 11th 2019]; # jan 13th 2020- start Tam  GENETIC TESTING: [Dr.Byrnett] NEGATIVE FOR - ATM/ BRCA2/CHEK2/PTEN/TP53/BRCA1/CDH1/PALB2/ STK11   DIAGNOSIS: BREAST CA   STAGE:  I/mammaprint-H ;GOALS: cure  CURRENT/MOST RECENT THERAPY: Tam      Malignant neoplasm of upper-outer quadrant of right breast in female, estrogen receptor positive ([place])   11/03/2017 Initial Diagnosis    Malignant neoplasm of upper-outer quadrant of right breast in female, estrogen receptor positive ([place])       INTERVAL HISTORY:  Crystal Branch 48 y.o.  female pleasant patient above history of stage I breast cancer ER PR positive HER-2/neu negative high risk mamma print status post chemotherapy currently on tamoxifen is here for follow-up.  Patient complains of mild hot flashes.  Complains of mild tingling and numbness in the hands and feet.  Complains of mild fatigue.  Denies any mood swings.  Review of Systems  Constitutional: Positive for malaise/fatigue. Negative for chills, diaphoresis, fever and weight loss.  HENT: Negative for nosebleeds and sore throat.   Eyes: Negative for double vision.  Respiratory: Negative for cough, hemoptysis, sputum production, shortness of breath and  wheezing.   Cardiovascular: Negative for chest pain, palpitations, orthopnea and leg swelling.  Gastrointestinal: Negative for abdominal pain, blood in stool, constipation, heartburn, melena, nausea and vomiting.  Genitourinary: Negative for dysuria, frequency and urgency.  Musculoskeletal: Negative for back pain and joint pain.  Skin: Negative for itching.  Neurological: Positive for tingling. Negative for dizziness, focal weakness, weakness and headaches.  Endo/Heme/Allergies: Does not bruise/bleed easily.  Psychiatric/Behavioral: Negative for depression. The patient is not nervous/anxious and does not have insomnia.       PAST MEDICAL HISTORY :  Past Medical History:  Diagnosis Date  . BRCA negative 09/2016   [facility]  . Breast CA ([place]) 09/30/2017   INVASIVE MAMMARY CARCINOMA WITH MUCINOUS FEATURES/ ER/PR 90%; Her 2 neu: Negative.  Mammoprint: High risk.   Marland Kitchen Dysrhythmia     PAST SURGICAL HISTORY :   Past Surgical History:  Procedure Laterality Date  . 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 BREAST LUMPECTOMY Right 10/26/2017  . BREAST LUMPECTOMY WITH SENTINEL LYMPH NODE BIOPSY Right 10/26/2017   Procedure: BREAST LUMPECTOMY WITH SENTINEL LYMPH NODE BX;  Surgeon: Robert Bellow, MD;  Location: [facility] ORS;  Service: General;  Laterality: Right;  . TONSILLECTOMY  2015    FAMILY HISTORY :   Family History  Problem Relation Age of Onset  . Breast cancer Maternal Aunt 60  . Breast cancer Maternal Grandmother 93  . Colon cancer Mother 76    SOCIAL HISTORY:   Social History   Tobacco Use  . Smoking status: Never Smoker  . Smokeless tobacco: Never Used  Substance Use Topics  .  Alcohol use: Never    Frequency: Never  . Drug use: Never    ALLERGIES:  has No Known Allergies.  MEDICATIONS:  Current Outpatient Medications  Medication Sig Dispense Refill  . acetaminophen (TYLENOL) 325 MG  tablet Take 325 mg by mouth every 6 (six) hours as needed (for pain).    Marland Kitchen loratadine (CLARITIN) 10 MG tablet Take 1 tablet (10 mg total) by mouth daily.    . Misc Natural Products (OSTEO BI-FLEX JOINT SHIELD PO) Take 1 capsule by mouth 2 (two) times daily.     . Multiple Vitamin (MULTIVITAMIN WITH MINERALS) TABS tablet Take 1 tablet by mouth daily.    . Probiotic Product ([place]) CAPS Take 1 capsule by mouth daily with supper.     . tamoxifen (NOLVADEX) 20 MG tablet Take 1 tablet (20 mg total) by mouth daily. 90 tablet 2   No current facility-administered medications for this visit.     PHYSICAL EXAMINATION: ECOG PERFORMANCE STATUS: 0 - Asymptomatic  BP 103/68 (BP Location: Left Arm, Patient Position: Sitting, Cuff Size: Normal)   Pulse 60   Temp (!) 97.1 F (36.2 C) (Tympanic)   Resp 16   Wt 153 lb 12.8 oz (69.8 kg)   BMI 27.24 kg/m   Filed Weights   06/28/18 1348  Weight: 153 lb 12.8 oz (69.8 kg)   Physical Exam  Constitutional: She is oriented to person, place, and time and well-developed, well-nourished, and in no distress.  Alone.  Walking by self.  HENT:  Head: Normocephalic and atraumatic.  Mouth/Throat: Oropharynx is clear and moist. No oropharyngeal exudate.  Eyes: Pupils are equal, round, and reactive to light.  Neck: Normal range of motion. Neck supple.  Cardiovascular: Normal rate and regular rhythm.  Pulmonary/Chest: No respiratory distress. She has no wheezes.  Abdominal: Soft. Bowel sounds are normal. She exhibits no distension and no mass. There is no abdominal tenderness. There is no rebound and no guarding.  Musculoskeletal: Normal range of motion.        General: No tenderness or edema.  Neurological: She is alert and oriented to person, place, and time.  Skin: Skin is warm.  Psychiatric: Affect normal.      LABORATORY DATA:  I have reviewed the data as listed    Component Value Date/Time   NA 143 05/31/2018 1006   NA 143  10/13/2017 0920   NA 142 06/19/2011 2227   K 4.0 05/31/2018 1006   K 3.8 06/19/2011 2227   CL 104 05/31/2018 1006   CL 107 06/19/2011 2227   CO2 32 05/31/2018 1006   CO2 21 06/19/2011 2227   GLUCOSE 89 05/31/2018 1006   GLUCOSE 103 (H) 06/19/2011 2227   BUN 16 05/31/2018 1006   BUN 13 10/13/2017 0920   BUN 10 06/19/2011 2227   CREATININE 0.55 05/31/2018 1006   CREATININE 0.75 06/19/2011 2227   CALCIUM 9.6 05/31/2018 1006   CALCIUM 9.5 06/19/2011 2227   PROT 7.4 05/31/2018 1006   PROT 6.5 10/13/2017 0920   ALBUMIN 4.7 05/31/2018 1006   ALBUMIN 4.6 10/13/2017 0920   AST 23 05/31/2018 1006   ALT 21 05/31/2018 1006   ALKPHOS 70 05/31/2018 1006   BILITOT 0.7 05/31/2018 1006   BILITOT 0.4 10/13/2017 0920   GFRNONAA >60 05/31/2018 1006   GFRNONAA >60 06/19/2011 2227   GFRAA >60 05/31/2018 1006   GFRAA >60 06/19/2011 2227    No results found for: SPEP, UPEP  Lab Results  Component Value Date  WBC 2.8 (L) 05/31/2018   NEUTROABS 1.9 05/31/2018   HGB 13.2 05/31/2018   HCT 40.4 05/31/2018   MCV 97.3 05/31/2018   PLT 172 05/31/2018      Chemistry      Component Value Date/Time   NA 143 05/31/2018 1006   NA 143 10/13/2017 0920   NA 142 06/19/2011 2227   K 4.0 05/31/2018 1006   K 3.8 06/19/2011 2227   CL 104 05/31/2018 1006   CL 107 06/19/2011 2227   CO2 32 05/31/2018 1006   CO2 21 06/19/2011 2227   BUN 16 05/31/2018 1006   BUN 13 10/13/2017 0920   BUN 10 06/19/2011 2227   CREATININE 0.55 05/31/2018 1006   CREATININE 0.75 06/19/2011 2227      Component Value Date/Time   CALCIUM 9.6 05/31/2018 1006   CALCIUM 9.5 06/19/2011 2227   ALKPHOS 70 05/31/2018 1006   AST 23 05/31/2018 1006   ALT 21 05/31/2018 1006   BILITOT 0.7 05/31/2018 1006   BILITOT 0.4 10/13/2017 0920       RADIOGRAPHIC STUDIES: I have personally reviewed the radiological images as listed and agreed with the findings in the report. No results found.   ASSESSMENT & PLAN:  Malignant neoplasm  of upper-outer quadrant of right breast in female, estrogen receptor positive ([place]) #Stage I ER PR positive HER-2 negative; HIGH risk mamma print.   On adjuvant Tamoxifen.  # I had a long discussion SOFT and TEXT data regarding in high risk patients [patients needing chemotherapy/lymph node positive/age < 35y] the benefit [8 year DFS] ovarian function suppression plus AI [83%] versus OFS plus tamoxifen [78%]versus tamoxifen alone [71%]   # Also reviewed the potential side effects of OFS with AI-acute side effects like musculoskeletal and also long-term side effects including cardiovascular disease osteoporosis etc. Also discussed increased risk of postmenopausal symptoms-including hot flashes sexual dysfunction etc.  #Discussed that ovarian function suppression could be achieved either by using monthly-GnRH agonist versus oophorectomy.  She understand that [place] agonist could well be reversible; bilateral surgical oophrectomy would lead to permanent menopause.  #Patient is undecided about oophorectomy-interested in a trial of ovarian suppression.  Start patient on Triptorelin.   # Also discussed regarding risk of osteoporosis; also the adjuvant benefit from bisphosphonates.  Discussed the potential risk versus benefits.  Patient interested.  #Hot flashes-grade 1 secondary to tamoxifen monitor for now.  # Peripheral neuropathy- G-1; stable  # DISPOSITION: # Follow up in 4 weeks;MD;Triptorelin; Zometa infusion; BMD prior- cbc/cmp MD-Dr.B   Orders Placed This Encounter  Procedures  . DG Bone Density    Standing Status:   Future    Standing Expiration Date:   06/28/2019    Order Specific Question:   Reason for Exam (SYMPTOM  OR DIAGNOSIS REQUIRED)    Answer:   Breast cancer    Order Specific Question:   Is the patient pregnant?    Answer:   No    Order Specific Question:   Preferred imaging location?    Answer:   [facility]  . CBC with Differential/Platelet    Standing Status:    Future    Standing Expiration Date:   06/29/2019  . Comprehensive metabolic panel    Standing Status:   Future    Standing Expiration Date:   06/29/2019   All questions were answered. The patient knows to call the clinic with any problems, questions or concerns.      Cammie Sickle, MD 06/29/2018 8:52 AM

## 2018-06-28 NOTE — Assessment & Plan Note (Addendum)
#  Stage I ER PR positive HER-2 negative; HIGH risk mamma print.   On adjuvant Tamoxifen.  # I had a long discussion SOFT and TEXT data regarding in high risk patients [patients needing chemotherapy/lymph node positive/age < 35y] the benefit [8 year DFS] ovarian function suppression plus AI [83%] versus OFS plus tamoxifen [78%]versus tamoxifen alone [71%]   # Also reviewed the potential side effects of OFS with AI-acute side effects like musculoskeletal and also long-term side effects including cardiovascular disease osteoporosis etc. Also discussed increased risk of postmenopausal symptoms-including hot flashes sexual dysfunction etc.  #Discussed that ovarian function suppression could be achieved either by using monthly-GnRH agonist versus oophorectomy.  She understand that [place] agonist could well be reversible; bilateral surgical oophrectomy would lead to permanent menopause.  #Patient is undecided about oophorectomy-interested in a trial of ovarian suppression.  Start patient on Triptorelin.   # Also discussed regarding risk of osteoporosis; also the adjuvant benefit from bisphosphonates.  Discussed the potential risk versus benefits.  Patient interested.  #Hot flashes-grade 1 secondary to tamoxifen monitor for now.  # Peripheral neuropathy- G-1; stable  # DISPOSITION: # Follow up in 4 weeks;MD;Triptorelin; Zometa infusion; BMD prior- cbc/cmp MD-Dr.B

## 2018-07-26 ENCOUNTER — Inpatient Hospital Stay: Payer: Managed Care, Other (non HMO)

## 2018-07-26 ENCOUNTER — Inpatient Hospital Stay: Payer: Managed Care, Other (non HMO) | Admitting: Internal Medicine

## 2018-07-26 ENCOUNTER — Inpatient Hospital Stay: Payer: Managed Care, Other (non HMO) | Attending: Internal Medicine

## 2018-07-26 ENCOUNTER — Encounter: Payer: Self-pay | Admitting: Internal Medicine

## 2018-07-26 ENCOUNTER — Other Ambulatory Visit: Payer: Self-pay

## 2018-07-26 VITALS — BP 125/75 | HR 66 | Temp 97.8°F | Resp 16 | Wt 152.0 lb

## 2018-07-26 DIAGNOSIS — Z79810 Long term (current) use of selective estrogen receptor modulators (SERMs): Secondary | ICD-10-CM

## 2018-07-26 DIAGNOSIS — Z923 Personal history of irradiation: Secondary | ICD-10-CM

## 2018-07-26 DIAGNOSIS — Z170 Estrogen receptor positive status [ER+]: Secondary | ICD-10-CM | POA: Diagnosis not present

## 2018-07-26 DIAGNOSIS — C50411 Malignant neoplasm of upper-outer quadrant of right female breast: Secondary | ICD-10-CM

## 2018-07-26 DIAGNOSIS — Z9221 Personal history of antineoplastic chemotherapy: Secondary | ICD-10-CM | POA: Diagnosis not present

## 2018-07-26 DIAGNOSIS — G629 Polyneuropathy, unspecified: Secondary | ICD-10-CM | POA: Insufficient documentation

## 2018-07-26 DIAGNOSIS — N951 Menopausal and female climacteric states: Secondary | ICD-10-CM | POA: Diagnosis not present

## 2018-07-26 LAB — CBC WITH DIFFERENTIAL/PLATELET
Abs Immature Granulocytes: 0 10*3/uL (ref 0.00–0.07)
Basophils Absolute: 0 10*3/uL (ref 0.0–0.1)
Basophils Relative: 1 %
Eosinophils Absolute: 0 10*3/uL (ref 0.0–0.5)
Eosinophils Relative: 1 %
HCT: 37 % (ref 36.0–46.0)
Hemoglobin: 12.5 g/dL (ref 12.0–15.0)
Immature Granulocytes: 0 %
Lymphocytes Relative: 36 %
Lymphs Abs: 1 10*3/uL (ref 0.7–4.0)
MCH: 33.2 pg (ref 26.0–34.0)
MCHC: 33.8 g/dL (ref 30.0–36.0)
MCV: 98.4 fL (ref 80.0–100.0)
MONOS PCT: 10 %
Monocytes Absolute: 0.3 10*3/uL (ref 0.1–1.0)
Neutro Abs: 1.5 10*3/uL — ABNORMAL LOW (ref 1.7–7.7)
Neutrophils Relative %: 52 %
Platelets: 179 10*3/uL (ref 150–400)
RBC: 3.76 MIL/uL — ABNORMAL LOW (ref 3.87–5.11)
RDW: 12.9 % (ref 11.5–15.5)
WBC: 2.8 10*3/uL — ABNORMAL LOW (ref 4.0–10.5)
nRBC: 0 % (ref 0.0–0.2)

## 2018-07-26 LAB — COMPREHENSIVE METABOLIC PANEL
ALT: 21 U/L (ref 0–44)
AST: 27 U/L (ref 15–41)
Albumin: 4.4 g/dL (ref 3.5–5.0)
Alkaline Phosphatase: 50 U/L (ref 38–126)
Anion gap: 7 (ref 5–15)
BUN: 15 mg/dL (ref 6–20)
CO2: 27 mmol/L (ref 22–32)
CREATININE: 0.68 mg/dL (ref 0.44–1.00)
Calcium: 8.9 mg/dL (ref 8.9–10.3)
Chloride: 106 mmol/L (ref 98–111)
GFR calc non Af Amer: >60 mL/min (ref >60)
Glucose, Bld: 93 mg/dL (ref 70–99)
Potassium: 3.5 mmol/L (ref 3.5–5.1)
Sodium: 140 mmol/L (ref 135–145)
Total Bilirubin: 0.9 mg/dL (ref 0.3–1.2)
Total Protein: 6.9 g/dL (ref 6.5–8.1)

## 2018-07-26 MED ORDER — ZOLEDRONIC ACID 4 MG/100ML IV SOLN
4.0000 mg | Freq: Once | INTRAVENOUS | Status: AC
  Administered 2018-07-26: 4 mg via INTRAVENOUS
  Filled 2018-07-26: qty 100

## 2018-07-26 MED ORDER — SODIUM CHLORIDE 0.9 % IV SOLN
Freq: Once | INTRAVENOUS | Status: AC
  Administered 2018-07-26: 15:00:00 via INTRAVENOUS
  Filled 2018-07-26: qty 250

## 2018-07-26 MED ORDER — TRIPTORELIN PAMOATE 3.75 MG IM SUSR
3.7500 mg | Freq: Once | INTRAMUSCULAR | Status: AC
  Administered 2018-07-26: 3.75 mg via INTRAMUSCULAR
  Filled 2018-07-26: qty 3.75

## 2018-07-26 NOTE — Progress Notes (Signed)
[facility] OFFICE PROGRESS NOTE  Patient Care Team: Cletis Athens, MD as PCP - General (Internal Medicine) Bary Castilla, Forest Gleason, MD as Surgeon (General Surgery) Rico Junker, RN as Oncology Nurse Navigator Noreene Filbert, MD as Referring Physician (Radiation Oncology) Cammie Sickle, MD as Consulting Physician (Internal Medicine) Lequita Asal, MD as Referring Physician (Hematology and Oncology)  Cancer Staging No matching staging information was found for the patient.   Oncology History   # May 2019- RIGHT BREAST CA s/p Lumpec & SLNBx [pT1c pN0 (sn); G-1;  margins clear; ER/PR > 90%; Her 2 neu- FISH-NEG. MAMMAPRINT [Dr.Byrnett]- HIGH RISK  # July 29th TC x4 [finished 02/15/2018] s/p RT [dec 11th 2019]; # jan 13th 2020- start Tam  # March 9th 2020- Triptorelin  # march 9th 2020- Zometa 4 mg/ adjuvant  GENETIC TESTING: [Dr.Byrnett] NEGATIVE FOR - ATM/ BRCA2/CHEK2/PTEN/TP53/BRCA1/CDH1/PALB2/ STK11   DIAGNOSIS: BREAST CA   STAGE:  I/mammaprint-H ;GOALS: cure  CURRENT/MOST RECENT THERAPY: Tam + Triptorelin      Malignant neoplasm of upper-outer quadrant of right breast in female, estrogen receptor positive ([place])   11/03/2017 Initial Diagnosis    Malignant neoplasm of upper-outer quadrant of right breast in female, estrogen receptor positive ([place])       INTERVAL HISTORY:  Crystal Branch 48 y.o.  female pleasant patient above history of stage I breast cancer ER PR positive HER-2/neu negative high risk mamma print status post chemotherapy currently on tamoxifen is here for follow-up.  Patient continues to complain of intermittent hot flashes.  Complains of intermittent tingling and numbness in hands.  Mild fatigue.  Denies any mood swings or signs of depression.  Review of Systems  Constitutional: Positive for malaise/fatigue. Negative for chills, diaphoresis, fever and weight loss.  HENT: Negative for nosebleeds and sore throat.   Eyes: Negative  for double vision.  Respiratory: Negative for cough, hemoptysis, sputum production, shortness of breath and wheezing.   Cardiovascular: Negative for chest pain, palpitations, orthopnea and leg swelling.  Gastrointestinal: Negative for abdominal pain, blood in stool, constipation, heartburn, melena, nausea and vomiting.  Genitourinary: Negative for dysuria, frequency and urgency.  Musculoskeletal: Negative for back pain and joint pain.  Skin: Negative for itching.  Neurological: Positive for tingling. Negative for dizziness, focal weakness, weakness and headaches.  Endo/Heme/Allergies: Does not bruise/bleed easily.  Psychiatric/Behavioral: Negative for depression. The patient is not nervous/anxious and does not have insomnia.       PAST MEDICAL HISTORY :  Past Medical History:  Diagnosis Date  . BRCA negative 09/2016   [facility]  . Breast CA ([place]) 09/30/2017   INVASIVE MAMMARY CARCINOMA WITH MUCINOUS FEATURES/ ER/PR 90%; Her 2 neu: Negative.  Mammoprint: High risk.   Marland Kitchen Dysrhythmia     PAST SURGICAL HISTORY :   Past Surgical History:  Procedure Laterality Date  . 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 BREAST LUMPECTOMY Right 10/26/2017  . BREAST LUMPECTOMY WITH SENTINEL LYMPH NODE BIOPSY Right 10/26/2017   Procedure: BREAST LUMPECTOMY WITH SENTINEL LYMPH NODE BX;  Surgeon: Robert Bellow, MD;  Location: [facility] ORS;  Service: General;  Laterality: Right;  . TONSILLECTOMY  2015    FAMILY HISTORY :   Family History  Problem Relation Age of Onset  . Breast cancer Maternal Aunt 60  . Breast cancer Maternal Grandmother 91  . Colon cancer Mother 73    SOCIAL HISTORY:   Social History  Tobacco Use  . Smoking status: Never Smoker  . Smokeless tobacco: Never Used  Substance Use Topics  . Alcohol use: Never    Frequency: Never  . Drug use: Never    ALLERGIES:  has No Known  Allergies.  MEDICATIONS:  Current Outpatient Medications  Medication Sig Dispense Refill  . acetaminophen (TYLENOL) 325 MG tablet Take 325 mg by mouth every 6 (six) hours as needed (for pain).    Marland Kitchen loratadine (CLARITIN) 10 MG tablet Take 1 tablet (10 mg total) by mouth daily.    . Misc Natural Products (OSTEO BI-FLEX JOINT SHIELD PO) Take 1 capsule by mouth 2 (two) times daily.     . Multiple Vitamin (MULTIVITAMIN WITH MINERALS) TABS tablet Take 1 tablet by mouth daily.    . Probiotic Product ([place]) CAPS Take 1 capsule by mouth daily with supper.     . tamoxifen (NOLVADEX) 20 MG tablet Take 1 tablet (20 mg total) by mouth daily. 90 tablet 2   No current facility-administered medications for this visit.     PHYSICAL EXAMINATION: ECOG PERFORMANCE STATUS: 0 - Asymptomatic  BP 125/75 (BP Location: Left Arm, Patient Position: Sitting, Cuff Size: Normal)   Pulse 66   Temp 97.8 F (36.6 C) (Tympanic)   Resp 16   Wt 152 lb (68.9 kg)   BMI 26.93 kg/m   Filed Weights   07/26/18 1355  Weight: 152 lb (68.9 kg)   Physical Exam  Constitutional: She is oriented to person, place, and time and well-developed, well-nourished, and in no distress.  Alone.  Walking by self.  HENT:  Head: Normocephalic and atraumatic.  Mouth/Throat: Oropharynx is clear and moist. No oropharyngeal exudate.  Eyes: Pupils are equal, round, and reactive to light.  Neck: Normal range of motion. Neck supple.  Cardiovascular: Normal rate and regular rhythm.  Pulmonary/Chest: No respiratory distress. She has no wheezes.  Abdominal: Soft. Bowel sounds are normal. She exhibits no distension and no mass. There is no abdominal tenderness. There is no rebound and no guarding.  Musculoskeletal: Normal range of motion.        General: No tenderness or edema.  Neurological: She is alert and oriented to person, place, and time.  Skin: Skin is warm.  Psychiatric: Affect normal.      LABORATORY DATA:  I  have reviewed the data as listed    Component Value Date/Time   NA 140 07/26/2018 1312   NA 143 10/13/2017 0920   NA 142 06/19/2011 2227   K 3.5 07/26/2018 1312   K 3.8 06/19/2011 2227   CL 106 07/26/2018 1312   CL 107 06/19/2011 2227   CO2 27 07/26/2018 1312   CO2 21 06/19/2011 2227   GLUCOSE 93 07/26/2018 1312   GLUCOSE 103 (H) 06/19/2011 2227   BUN 15 07/26/2018 1312   BUN 13 10/13/2017 0920   BUN 10 06/19/2011 2227   CREATININE 0.68 07/26/2018 1312   CREATININE 0.75 06/19/2011 2227   CALCIUM 8.9 07/26/2018 1312   CALCIUM 9.5 06/19/2011 2227   PROT 6.9 07/26/2018 1312   PROT 6.5 10/13/2017 0920   ALBUMIN 4.4 07/26/2018 1312   ALBUMIN 4.6 10/13/2017 0920   AST 27 07/26/2018 1312   ALT 21 07/26/2018 1312   ALKPHOS 50 07/26/2018 1312   BILITOT 0.9 07/26/2018 1312   BILITOT 0.4 10/13/2017 0920   GFRNONAA >60 07/26/2018 1312   GFRNONAA >60 06/19/2011 2227   GFRAA >60 07/26/2018 1312   GFRAA >60 06/19/2011 2227  No results found for: SPEP, UPEP  Lab Results  Component Value Date   WBC 2.8 (L) 07/26/2018   NEUTROABS 1.5 (L) 07/26/2018   HGB 12.5 07/26/2018   HCT 37.0 07/26/2018   MCV 98.4 07/26/2018   PLT 179 07/26/2018      Chemistry      Component Value Date/Time   NA 140 07/26/2018 1312   NA 143 10/13/2017 0920   NA 142 06/19/2011 2227   K 3.5 07/26/2018 1312   K 3.8 06/19/2011 2227   CL 106 07/26/2018 1312   CL 107 06/19/2011 2227   CO2 27 07/26/2018 1312   CO2 21 06/19/2011 2227   BUN 15 07/26/2018 1312   BUN 13 10/13/2017 0920   BUN 10 06/19/2011 2227   CREATININE 0.68 07/26/2018 1312   CREATININE 0.75 06/19/2011 2227      Component Value Date/Time   CALCIUM 8.9 07/26/2018 1312   CALCIUM 9.5 06/19/2011 2227   ALKPHOS 50 07/26/2018 1312   AST 27 07/26/2018 1312   ALT 21 07/26/2018 1312   BILITOT 0.9 07/26/2018 1312   BILITOT 0.4 10/13/2017 0920       RADIOGRAPHIC STUDIES: I have personally reviewed the radiological images as listed and  agreed with the findings in the report. No results found.   ASSESSMENT & PLAN:  Malignant neoplasm of upper-outer quadrant of right breast in female, estrogen receptor positive ([place]) #Stage I ER PR positive HER-2 negative; HIGH risk mamma print.   On adjuvant Tamoxifen. STABLE.  # Tolerating well; continue Tamoxifen. Proceed with OFS with triptorelin; again discussed the rationale/ AEs.   #Risk of osteoporosis -awaiting BMD in 2 weeks. regarding risk of osteoporosis; also the adjuvant benefit from bisphosphonates. Proceed with Zometa today; again discussed the rationale/ AEs.   # Hot flashes-grade 1; STABLE.   # Peripheral neuropathy- G-1; STABLE.   # DISPOSITION: # Triptorelin/zometa today. # triptorelin in 4 weeks. # Follow up in 8 weeks;MD;Triptorelin; cbc/cmp MD-Dr.B   Orders Placed This Encounter  Procedures  . CBC with Differential    Standing Status:   Future    Standing Expiration Date:   07/26/2019  . Comprehensive metabolic panel    Standing Status:   Future    Standing Expiration Date:   07/26/2019   All questions were answered. The patient knows to call the clinic with any problems, questions or concerns.      Cammie Sickle, MD 07/26/2018 4:29 PM

## 2018-07-26 NOTE — Assessment & Plan Note (Addendum)
#  Stage I ER PR positive HER-2 negative; HIGH risk mamma print.   On adjuvant Tamoxifen. STABLE.  # Tolerating well; continue Tamoxifen. Proceed with OFS with triptorelin; again discussed the rationale/ AEs.   #Risk of osteoporosis -awaiting BMD in 2 weeks. regarding risk of osteoporosis; also the adjuvant benefit from bisphosphonates. Proceed with Zometa today; again discussed the rationale/ AEs.   # Hot flashes-grade 1; STABLE.   # Peripheral neuropathy- G-1; STABLE.   # DISPOSITION: # Triptorelin/zometa today. # triptorelin in 4 weeks. # Follow up in 8 weeks;MD;Triptorelin; cbc/cmp MD-Dr.B

## 2018-08-09 ENCOUNTER — Other Ambulatory Visit: Payer: Managed Care, Other (non HMO)

## 2018-08-23 ENCOUNTER — Other Ambulatory Visit: Payer: Self-pay

## 2018-08-23 ENCOUNTER — Inpatient Hospital Stay: Payer: Managed Care, Other (non HMO) | Attending: Internal Medicine

## 2018-08-23 DIAGNOSIS — Z923 Personal history of irradiation: Secondary | ICD-10-CM | POA: Diagnosis not present

## 2018-08-23 DIAGNOSIS — N951 Menopausal and female climacteric states: Secondary | ICD-10-CM | POA: Insufficient documentation

## 2018-08-23 DIAGNOSIS — C50411 Malignant neoplasm of upper-outer quadrant of right female breast: Secondary | ICD-10-CM | POA: Diagnosis not present

## 2018-08-23 DIAGNOSIS — Z170 Estrogen receptor positive status [ER+]: Secondary | ICD-10-CM | POA: Insufficient documentation

## 2018-08-23 DIAGNOSIS — Z79810 Long term (current) use of selective estrogen receptor modulators (SERMs): Secondary | ICD-10-CM | POA: Insufficient documentation

## 2018-08-23 DIAGNOSIS — G629 Polyneuropathy, unspecified: Secondary | ICD-10-CM | POA: Insufficient documentation

## 2018-08-23 DIAGNOSIS — Z9221 Personal history of antineoplastic chemotherapy: Secondary | ICD-10-CM | POA: Insufficient documentation

## 2018-08-23 MED ORDER — TRIPTORELIN PAMOATE 3.75 MG IM SUSR
3.7500 mg | Freq: Once | INTRAMUSCULAR | Status: AC
  Administered 2018-08-23: 3.75 mg via INTRAMUSCULAR
  Filled 2018-08-23: qty 3.75

## 2018-09-13 ENCOUNTER — Other Ambulatory Visit: Payer: Self-pay

## 2018-09-13 DIAGNOSIS — Z170 Estrogen receptor positive status [ER+]: Principal | ICD-10-CM

## 2018-09-13 DIAGNOSIS — C50411 Malignant neoplasm of upper-outer quadrant of right female breast: Secondary | ICD-10-CM

## 2018-09-16 ENCOUNTER — Other Ambulatory Visit: Payer: Managed Care, Other (non HMO)

## 2018-09-16 ENCOUNTER — Other Ambulatory Visit: Payer: Self-pay

## 2018-09-17 ENCOUNTER — Telehealth: Payer: Self-pay | Admitting: Internal Medicine

## 2018-09-17 ENCOUNTER — Inpatient Hospital Stay: Payer: Managed Care, Other (non HMO)

## 2018-09-17 ENCOUNTER — Other Ambulatory Visit: Payer: Self-pay

## 2018-09-17 ENCOUNTER — Inpatient Hospital Stay: Payer: Managed Care, Other (non HMO) | Attending: Internal Medicine

## 2018-09-17 DIAGNOSIS — G629 Polyneuropathy, unspecified: Secondary | ICD-10-CM | POA: Diagnosis not present

## 2018-09-17 DIAGNOSIS — C50411 Malignant neoplasm of upper-outer quadrant of right female breast: Secondary | ICD-10-CM | POA: Diagnosis not present

## 2018-09-17 DIAGNOSIS — E28310 Symptomatic premature menopause: Secondary | ICD-10-CM | POA: Diagnosis not present

## 2018-09-17 DIAGNOSIS — Z79810 Long term (current) use of selective estrogen receptor modulators (SERMs): Secondary | ICD-10-CM | POA: Insufficient documentation

## 2018-09-17 DIAGNOSIS — M25549 Pain in joints of unspecified hand: Secondary | ICD-10-CM | POA: Diagnosis not present

## 2018-09-17 DIAGNOSIS — Z170 Estrogen receptor positive status [ER+]: Secondary | ICD-10-CM | POA: Insufficient documentation

## 2018-09-17 DIAGNOSIS — M25529 Pain in unspecified elbow: Secondary | ICD-10-CM | POA: Insufficient documentation

## 2018-09-17 LAB — CBC WITH DIFFERENTIAL/PLATELET
Abs Immature Granulocytes: 0 10*3/uL (ref 0.00–0.07)
Basophils Absolute: 0 10*3/uL (ref 0.0–0.1)
Basophils Relative: 1 %
Eosinophils Absolute: 0.1 10*3/uL (ref 0.0–0.5)
Eosinophils Relative: 2 %
HCT: 40.3 % (ref 36.0–46.0)
Hemoglobin: 13.7 g/dL (ref 12.0–15.0)
Immature Granulocytes: 0 %
Lymphocytes Relative: 33 %
Lymphs Abs: 1.2 10*3/uL (ref 0.7–4.0)
MCH: 34.1 pg — ABNORMAL HIGH (ref 26.0–34.0)
MCHC: 34 g/dL (ref 30.0–36.0)
MCV: 100.2 fL — ABNORMAL HIGH (ref 80.0–100.0)
Monocytes Absolute: 0.3 10*3/uL (ref 0.1–1.0)
Monocytes Relative: 8 %
Neutro Abs: 2.1 10*3/uL (ref 1.7–7.7)
Neutrophils Relative %: 56 %
Platelets: 169 10*3/uL (ref 150–400)
RBC: 4.02 MIL/uL (ref 3.87–5.11)
RDW: 11.8 % (ref 11.5–15.5)
WBC: 3.6 10*3/uL — ABNORMAL LOW (ref 4.0–10.5)
nRBC: 0 % (ref 0.0–0.2)

## 2018-09-17 LAB — COMPREHENSIVE METABOLIC PANEL
ALT: 17 U/L (ref 0–44)
AST: 22 U/L (ref 15–41)
Albumin: 4.7 g/dL (ref 3.5–5.0)
Alkaline Phosphatase: 38 U/L (ref 38–126)
Anion gap: 5 (ref 5–15)
BUN: 19 mg/dL (ref 6–20)
CO2: 29 mmol/L (ref 22–32)
Calcium: 9.4 mg/dL (ref 8.9–10.3)
Chloride: 106 mmol/L (ref 98–111)
Creatinine, Ser: 0.64 mg/dL (ref 0.44–1.00)
GFR calc Af Amer: >60 mL/min (ref >60)
GFR calc non Af Amer: >60 mL/min (ref >60)
Glucose, Bld: 100 mg/dL — ABNORMAL HIGH (ref 70–99)
Potassium: 3.9 mmol/L (ref 3.5–5.1)
Sodium: 140 mmol/L (ref 135–145)
Total Bilirubin: 0.6 mg/dL (ref 0.3–1.2)
Total Protein: 7.3 g/dL (ref 6.5–8.1)

## 2018-09-17 MED ORDER — TRIPTORELIN PAMOATE 3.75 MG IM SUSR
3.7500 mg | Freq: Once | INTRAMUSCULAR | Status: AC
  Administered 2018-09-17: 3.75 mg via INTRAMUSCULAR
  Filled 2018-09-17: qty 3.75

## 2018-09-20 ENCOUNTER — Inpatient Hospital Stay (HOSPITAL_BASED_OUTPATIENT_CLINIC_OR_DEPARTMENT_OTHER): Payer: Managed Care, Other (non HMO) | Admitting: Internal Medicine

## 2018-09-20 ENCOUNTER — Other Ambulatory Visit: Payer: Managed Care, Other (non HMO)

## 2018-09-20 ENCOUNTER — Other Ambulatory Visit: Payer: Self-pay

## 2018-09-20 ENCOUNTER — Encounter: Payer: Self-pay | Admitting: Internal Medicine

## 2018-09-20 ENCOUNTER — Ambulatory Visit: Payer: Managed Care, Other (non HMO)

## 2018-09-20 DIAGNOSIS — Z79810 Long term (current) use of selective estrogen receptor modulators (SERMs): Secondary | ICD-10-CM

## 2018-09-20 DIAGNOSIS — Z170 Estrogen receptor positive status [ER+]: Secondary | ICD-10-CM

## 2018-09-20 DIAGNOSIS — C50411 Malignant neoplasm of upper-outer quadrant of right female breast: Secondary | ICD-10-CM | POA: Diagnosis not present

## 2018-09-20 DIAGNOSIS — G629 Polyneuropathy, unspecified: Secondary | ICD-10-CM

## 2018-09-20 NOTE — Assessment & Plan Note (Addendum)
#  Stage I ER PR positive HER-2 negative; HIGH risk mamma print.   On adjuvant Tamoxifen.  Stable  # Tolerating with mild to moderate side effects. Proceed with OFS with triptorelin.  However depend on the frequency of the visits with recommend every 3 months injection.  Discussed regarding holding off gynecology evaluation at this time given COVID pandemic.  # joint pains- G-1-2; patient already taking Osteo Bi-Flex.  Exercising.  Hopefully over time joint pain should improve improved.  # Hot flashes-grade 1; stable  # Peripheral neuropathy- G-1; stable  # DISPOSITION: # triptorelin in 4 weeks # Follow up in 2 months- MD video-Dr.B

## 2018-09-20 NOTE — Progress Notes (Signed)
I connected with [name] on 09/20/18 at  1:30 PM EDT by video enabled telemedicine visit and verified that I am speaking with the correct person using two identifiers.  I discussed the limitations, risks, security and privacy concerns of performing an evaluation and management service by telemedicine and the availability of in-person appointments. I also discussed with the patient that there may be a patient responsible charge related to this service. The patient expressed understanding and agreed to proceed.    Other persons participating in the visit and their role in the encounter: none  Patient's location: home  Provider's location: home   Oncology History   # May 2019- RIGHT BREAST CA s/p Lumpec & SLNBx [pT1c pN0 (sn); G-1;  margins clear; ER/PR > 90%; Her 2 neu- FISH-NEG. MAMMAPRINT [Dr.Byrnett]- HIGH RISK  # July 29th TC x4 [finished 02/15/2018] s/p RT [dec 11th 2019]; # jan 13th 2020- start Tam  # March 9th 2020- Triptorelinq 4q; starting June 2020- switch to q12w  # march 9th 2020- Zometa 4 mg/ adjuvant  GENETIC TESTING: [Dr.Byrnett] NEGATIVE FOR - ATM/ BRCA2/CHEK2/PTEN/TP53/BRCA1/CDH1/PALB2/ STK11   DIAGNOSIS: BREAST CA   STAGE:  I/mammaprint-H ;GOALS: cure  CURRENT/MOST RECENT THERAPY: Tam + Triptorelin      Malignant neoplasm of upper-outer quadrant of right breast in female, estrogen receptor positive ([place])   11/03/2017 Initial Diagnosis    Malignant neoplasm of upper-outer quadrant of right breast in female, estrogen receptor positive ([place])      Chief Complaint: breast cancer    History of present illness:Crystal Branch 48 y.o.  female with history of stage I breast cancer ER PR positive high risk mamma print-currently on tamoxifen.  Patient states her hot flashes are fairly stable.  However notes to have worsening joint pains in the hands and also elbows.   Denies any significant mood swings.  She continues to be on triptorelin for her ovarian suppression.   She has never seen a gynecologist because of ongoing pandemic.  Observation/objective:  Assessment and plan: Malignant neoplasm of upper-outer quadrant of right breast in female, estrogen receptor positive ([place]) #Stage I ER PR positive HER-2 negative; HIGH risk mamma print.   On adjuvant Tamoxifen.  Stable  # Tolerating with mild to moderate side effects. Proceed with OFS with triptorelin.  However depend on the frequency of the visits with recommend every 3 months injection.  # joint pains- G-1-2; patient already taking Osteo Bi-Flex.  Exercising.  Hopefully over time joint pain should improve improved.  # Hot flashes-grade 1; stable  # Peripheral neuropathy- G-1; stable  # DISPOSITION: # triptorelin in 4 weeks # Follow up in 2 months- MD video-Dr.B    Follow-up instructions:  I discussed the assessment and treatment plan with the patient.  The patient was provided an opportunity to ask questions and all were answered.  The patient agreed with the plan and demonstrated understanding of instructions.  The patient was advised to call back or seek an in person evaluation if the symptoms worsen or if the condition fails to improve as anticipated.  Dr. Charlaine Dalton Daniels at [facility] 09/20/2018 2:21 PM

## 2018-10-18 ENCOUNTER — Other Ambulatory Visit: Payer: Self-pay

## 2018-10-18 ENCOUNTER — Inpatient Hospital Stay: Payer: Managed Care, Other (non HMO) | Attending: Internal Medicine

## 2018-10-18 DIAGNOSIS — Z5111 Encounter for antineoplastic chemotherapy: Secondary | ICD-10-CM | POA: Insufficient documentation

## 2018-10-18 DIAGNOSIS — Z170 Estrogen receptor positive status [ER+]: Secondary | ICD-10-CM | POA: Diagnosis not present

## 2018-10-18 DIAGNOSIS — C50411 Malignant neoplasm of upper-outer quadrant of right female breast: Secondary | ICD-10-CM

## 2018-10-18 MED ORDER — TRIPTORELIN PAMOATE 11.25 MG IM SUSR
11.2500 mg | Freq: Once | INTRAMUSCULAR | Status: AC
  Administered 2018-10-18: 11.25 mg via INTRAMUSCULAR
  Filled 2018-10-18: qty 11.25

## 2018-10-18 MED ORDER — TRIPTORELIN PAMOATE 3.75 MG IM SUSR: 11.2500 mg | Freq: Once | INTRAMUSCULAR | Status: DC

## 2018-10-29 ENCOUNTER — Other Ambulatory Visit: Payer: Self-pay

## 2018-11-01 ENCOUNTER — Other Ambulatory Visit: Payer: Self-pay

## 2018-11-01 ENCOUNTER — Encounter: Payer: Self-pay | Admitting: Radiation Oncology

## 2018-11-01 ENCOUNTER — Ambulatory Visit
Admission: RE | Admit: 2018-11-01 | Discharge: 2018-11-01 | Disposition: A | Discharge status: Home / Self Care | Payer: Managed Care, Other (non HMO) | Source: Ambulatory Visit | Attending: Radiation Oncology | Admitting: Radiation Oncology

## 2018-11-01 VITALS — BP 116/50 | HR 57 | Temp 97.8°F | Resp 16 | Wt 147.3 lb

## 2018-11-01 DIAGNOSIS — Z170 Estrogen receptor positive status [ER+]: Secondary | ICD-10-CM | POA: Insufficient documentation

## 2018-11-01 DIAGNOSIS — C50411 Malignant neoplasm of upper-outer quadrant of right female breast: Secondary | ICD-10-CM | POA: Diagnosis not present

## 2018-11-01 DIAGNOSIS — Z79810 Long term (current) use of selective estrogen receptor modulators (SERMs): Secondary | ICD-10-CM | POA: Insufficient documentation

## 2018-11-01 DIAGNOSIS — Z923 Personal history of irradiation: Secondary | ICD-10-CM | POA: Diagnosis not present

## 2018-11-01 NOTE — Progress Notes (Signed)
Radiation Oncology Follow up Note  Name: Crystal Branch   Date:   11/01/2018 MRN:  [id number] DOB: [birth year]    This 48 y.o. female presents to the clinic today for 14-monthfollow-up status post whole breast radiation to her right breast for stage I ER PR positive invasive mammary carcinoma..Marland Kitchen REFERRING PROVIDER: MCletis Athens MD  HPI: Patient is a 48year old female now seen at 6 months having completed whole breast radiation to her right breast for stage I ER PR positive HER-2 negative invasive mammary carcinoma.  She had neoadjuvant chemotherapy she is seen today in routine follow-up and is doing well.  She specifically denies breast tenderness cough or bone pain..  She is currently on tamoxifen tolerating that well without side effect.  She is scheduled for follow-up mammogram later this month I will review when available.  COMPLICATIONS OF TREATMENT: none  FOLLOW UP COMPLIANCE: keeps appointments   PHYSICAL EXAM:  BP (!) 116/50 (BP Location: Left Arm, Patient Position: Sitting)   Pulse (!) 57   Temp 97.8 F (36.6 C) (Tympanic)   Resp 16   Wt 147 lb 4.3 oz (66.8 kg)   BMI 26.09 kg/m  Lungs are clear to A&P cardiac examination essentially unremarkable with regular rate and rhythm. No dominant mass or nodularity is noted in either breast in 2 positions examined. Incision is well-healed. No axillary or supraclavicular adenopathy is appreciated. Cosmetic result is excellent.  Well-developed well-nourished patient in NAD. HEENT reveals PERLA, EOMI, discs not visualized.  Oral cavity is clear. No oral mucosal lesions are identified. Neck is clear without evidence of cervical or supraclavicular adenopathy. Lungs are clear to A&P. Cardiac examination is essentially unremarkable with regular rate and rhythm without murmur rub or thrill. Abdomen is benign with no organomegaly or masses noted. Motor sensory and DTR levels are equal and symmetric in the upper and lower extremities. Cranial nerves II  through XII are grossly intact. Proprioception is intact. No peripheral adenopathy or edema is identified. No motor or sensory levels are noted. Crude visual fields are within normal range.  RADIOLOGY RESULTS: Mammograms to be reviewed when available  PLAN: Present time she continues to do well with no evidence of disease.  I am pleased with her overall progress.  I have asked to see her back in 6 months and will start once a year follow-up visits.  I will review her mammogram when it becomes available later this month.  She continues on tamoxifen without side effect.  Patient knows to call with any concerns.  I would like to take this opportunity to thank you for allowing me to participate in the care of your patient..Noreene Filbert MD

## 2018-11-08 ENCOUNTER — Other Ambulatory Visit: Payer: Self-pay

## 2018-11-08 ENCOUNTER — Ambulatory Visit
Admission: RE | Admit: 2018-11-08 | Discharge: 2018-11-08 | Disposition: A | Discharge status: Home / Self Care | Payer: Managed Care, Other (non HMO) | Source: Ambulatory Visit | Attending: Internal Medicine | Admitting: Internal Medicine

## 2018-11-08 DIAGNOSIS — C50411 Malignant neoplasm of upper-outer quadrant of right female breast: Secondary | ICD-10-CM

## 2018-11-08 DIAGNOSIS — Z170 Estrogen receptor positive status [ER+]: Secondary | ICD-10-CM | POA: Insufficient documentation

## 2018-11-08 HISTORY — DX: Personal history of antineoplastic chemotherapy: Z92.21

## 2018-11-08 HISTORY — DX: Personal history of irradiation: Z92.3

## 2018-11-12 ENCOUNTER — Ambulatory Visit
Admission: RE | Admit: 2018-11-12 | Discharge: 2018-11-12 | Disposition: A | Discharge status: Home / Self Care | Payer: Managed Care, Other (non HMO) | Source: Ambulatory Visit | Attending: General Surgery | Admitting: General Surgery

## 2018-11-12 ENCOUNTER — Other Ambulatory Visit: Payer: Self-pay

## 2018-11-12 DIAGNOSIS — C50411 Malignant neoplasm of upper-outer quadrant of right female breast: Secondary | ICD-10-CM

## 2018-11-12 DIAGNOSIS — Z170 Estrogen receptor positive status [ER+]: Secondary | ICD-10-CM

## 2018-11-18 ENCOUNTER — Ambulatory Visit: Payer: Managed Care, Other (non HMO) | Admitting: General Surgery

## 2018-11-18 ENCOUNTER — Other Ambulatory Visit: Payer: Self-pay

## 2018-11-18 ENCOUNTER — Encounter: Payer: Self-pay | Admitting: General Surgery

## 2018-11-18 VITALS — BP 115/76 | HR 56 | Temp 97.8°F | Ht 64.0 in | Wt 149.0 lb

## 2018-11-18 DIAGNOSIS — C50411 Malignant neoplasm of upper-outer quadrant of right female breast: Secondary | ICD-10-CM | POA: Diagnosis not present

## 2018-11-18 DIAGNOSIS — Z170 Estrogen receptor positive status [ER+]: Secondary | ICD-10-CM

## 2018-11-18 DIAGNOSIS — Z800 Family history of malignant neoplasm of digestive organs: Secondary | ICD-10-CM

## 2018-11-18 DIAGNOSIS — Z1211 Encounter for screening for malignant neoplasm of colon: Secondary | ICD-10-CM

## 2018-11-18 NOTE — Progress Notes (Signed)
Patient ID: Crystal Branch, female   DOB: 1971-03-10, 48 y.o.   MRN: [id number]  Chief Complaint  Patient presents with  . Follow-up    mammogram    HPI Crystal Branch is a 48 y.o. female with a history of right breast cancer who presents for a breast evaluation. The most recent mammogram was done on 11/12/18. Patient does perform regular self breast checks and gets regular mammograms done. She reports no interval changes in her breast.   HPI  Past Medical History:  Diagnosis Date  . BRCA negative 09/2016   [facility]  . Breast CA ([place]) 09/30/2017   INVASIVE MAMMARY CARCINOMA WITH MUCINOUS FEATURES/ ER/PR 90%; Her 2 neu: Negative.  Mammoprint: High risk.   Marland Kitchen Dysrhythmia   . Personal history of chemotherapy   . Personal history of radiation therapy     Past Surgical History:  Procedure Laterality Date  . 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 BREAST LUMPECTOMY Right 10/26/2017  . BREAST LUMPECTOMY WITH SENTINEL LYMPH NODE BIOPSY Right 10/26/2017   Procedure: BREAST LUMPECTOMY WITH SENTINEL LYMPH NODE BX;  Surgeon: Robert Bellow, MD;  Location: [facility] ORS;  Service: General;  Laterality: Right;  . TONSILLECTOMY  2015    Family History  Problem Relation Age of Onset  . Breast cancer Maternal Aunt 60  . Breast cancer Maternal Grandmother 77  . Colon cancer Mother 70    Social History Social History   Tobacco Use  . Smoking status: Never Smoker  . Smokeless tobacco: Never Used  Substance Use Topics  . Alcohol use: Never    Frequency: Never  . Drug use: Never    No Known Allergies  Current Outpatient Medications  Medication Sig Dispense Refill  . acetaminophen (TYLENOL) 325 MG tablet Take 325 mg by mouth every 6 (six) hours as needed (for pain).    Marland Kitchen loratadine (CLARITIN) 10 MG tablet Take 1 tablet (10 mg total) by mouth daily.    . Misc Natural Products (OSTEO BI-FLEX JOINT SHIELD PO) Take 1  capsule by mouth 2 (two) times daily.     . Multiple Vitamin (MULTIVITAMIN WITH MINERALS) TABS tablet Take 1 tablet by mouth daily.    . Probiotic Product ([place]) CAPS Take 1 capsule by mouth daily with supper.     . tamoxifen (NOLVADEX) 20 MG tablet Take 1 tablet (20 mg total) by mouth daily. 90 tablet 2  . triptorelin (TRELESTAR LA) 11.25 MG injection Inject 11.25 mg into the muscle every 3 (three) months.    . Zoledronic Acid (ZOMETA) 4 MG/100ML IVPB Inject 4 mg into the vein every 6 (six) months.     No current facility-administered medications for this visit.     Review of Systems Review of Systems  Constitutional: Negative.   Respiratory: Negative.   Cardiovascular: Negative.     Blood pressure 115/76, pulse (!) 56, temperature 97.8 F (36.6 C), temperature source Skin, height 5' 4"  (1.626 m), weight 149 lb (67.6 kg), SpO2 98 %.  Physical Exam Physical Exam Exam conducted with a chaperone present.  Constitutional:      Appearance: She is well-developed.  Eyes:     General: No scleral icterus.    Conjunctiva/sclera: Conjunctivae normal.  Neck:     Musculoskeletal: Neck supple.  Cardiovascular:     Rate and Rhythm: Normal rate and regular rhythm.     Heart sounds: Normal heart sounds.  Pulmonary:     Effort: Pulmonary effort is normal.     Breath sounds: Normal breath sounds.  Chest:     Breasts:        Right: Normal.        Left: Normal.    Lymphadenopathy:     Cervical: No cervical adenopathy.     Upper Body:     Right upper body: No supraclavicular or axillary adenopathy.     Left upper body: No supraclavicular or axillary adenopathy.  Skin:    General: Skin is warm and dry.  Neurological:     Mental Status: She is alert and oriented to person, place, and time.     Data Reviewed Bilateral diagnostic mammograms dated November 12, 2018 were independently reviewed.  BI-RADS-2.  Bone density dated November 08, 2018: Normal.  Phone conversation  with Dr. Rogue Bussing regarding patient's long-term outlook.  Assessment Doing well with no evidence of recurrent disease.  Excellent tolerance of estrogen suppression.  Possible candidate for oophorectomy.  Patient is amenable to meet with GYN to discuss this procedure versus continued injections of triptorelin for adjuvant hormone suppression.  Candidate for screening colonoscopy based on age, past history of breast cancer and family history of colon cancer.  Plan  Ref to OBGYN Dr. Glennon Mac at [facility].  Return 1 year bilateral diagnotic mammogram .The patient is aware to call back for any questions or concerns.  Colonoscopy with possible biopsy/polypectomy prn: Information regarding the procedure, including its potential risks and complications (including but not limited to perforation of the bowel, which may require emergency surgery to repair, and bleeding) was verbally given to the patient. Educational information regarding lower intestinal endoscopy was given to the patient. Written instructions for how to complete the bowel prep using Miralax were provided. The importance of drinking ample fluids to avoid dehydration as a result of the prep emphasized.  HPI, Physical Exam, Assessment and Plan have been scribed under the direction and in the presence of [name], MD.  Gaspar Cola, CMA  HPI, Physical Exam, Assessment and Plan have been scribed under the direction and in the presence of Robert Bellow, MD  Concepcion Living, LPN  I have completed the exam and reviewed the above documentation for accuracy and completeness.  I agree with the above.  Haematologist has been used and any errors in dictation or transcription are unintentional.  [name], M.D., F.A.C.S.  Forest Gleason Rani Idler 11/19/2018, 11:34 AM  Patient has been scheduled for a colonoscopy on 12-01-18 at [facility]. Patient states she already has a bottle of Miralax at home and does not need a prescription sent  in. Colonoscopy instructions have been reviewed with the patient. This patient is aware to call the office if they have further questions.   Patient to have COVID testing 12-01-18 at the [facility]. She is aware to isolate after.   Dominga Ferry, CMA

## 2018-11-18 NOTE — Patient Instructions (Signed)
Colonoscopy, Adult A colonoscopy is an exam to look at the entire large intestine. During the exam, a lubricated, flexible tube that has a camera on the end of it is inserted into the anus and then passed into the rectum, colon, and other parts of the large intestine. You may have a colonoscopy as a part of normal colorectal screening or if you have certain symptoms, such as:  Lack of red blood cells (anemia).  Diarrhea that does not go away.  Abdominal pain.  Blood in your stool (feces). A colonoscopy can help screen for and diagnose medical problems, including:  Tumors.  Polyps.  Inflammation.  Areas of bleeding. Tell a health care provider about:  Any allergies you have.  All medicines you are taking, including vitamins, herbs, eye drops, creams, and over-the-counter medicines.  Any problems you or family members have had with anesthetic medicines.  Any blood disorders you have.  Any surgeries you have had.  Any medical conditions you have.  Any problems you have had passing stool. What are the risks? Generally, this is a safe procedure. However, problems may occur, including:  Bleeding.  A tear in the intestine.  A reaction to medicines given during the exam.  Infection (rare). What happens before the procedure? Eating and drinking restrictions Follow instructions from your health care provider about eating and drinking, which may include:  A few days before the procedure - follow a low-fiber diet. Avoid nuts, seeds, dried fruit, raw fruits, and vegetables.  1-3 days before the procedure - follow a clear liquid diet. Drink only clear liquids, such as clear broth or bouillon, black coffee or tea, clear juice, clear soft drinks or sports drinks, gelatin dessert, and popsicles. Avoid any liquids that contain red or purple dye.  On the day of the procedure - do not eat or drink anything starting 2 hours before the procedure, or within the time period that your  health care provider recommends. Up to 2 hours before the procedure, you may continue to drink clear liquids, such as water or clear fruit juice. Bowel prep If you were prescribed an oral bowel prep to clean out your colon:  Take it as told by your health care provider. Starting the day before your procedure, you will need to drink a large amount of medicated liquid. The liquid will cause you to have multiple loose stools until your stool is almost clear or light green.  If your skin or anus gets irritated from diarrhea, you may use these to relieve the irritation: ? Medicated wipes, such as adult wet wipes with aloe and vitamin E. ? A skin-soothing product like petroleum jelly.  If you vomit while drinking the bowel prep, take a break for up to 60 minutes and then begin the bowel prep again. If vomiting continues and you cannot take the bowel prep without vomiting, call your health care provider.  To clean out your colon, you may also be given: ? Laxative medicines. ? Instructions about how to use an enema. General instructions  Ask your health care provider about: ? Changing or stopping your regular medicines or supplements. This is especially important if you are taking iron supplements, diabetes medicines, or blood thinners. ? Taking medicines such as aspirin and ibuprofen. These medicines can thin your blood. Do not take these medicines before the procedure if your health care provider tells you not to.  Plan to have someone take you home from the hospital or clinic. What happens during the procedure?     An IV may be inserted into one of your veins.  You will be given medicine to help you relax (sedative).  To reduce your risk of infection: ? Your health care team will wash or sanitize their hands. ? Your anal area will be washed with soap.  You will be asked to lie on your side with your knees bent.  Your health care provider will lubricate a long, thin, flexible tube. The  tube will have a camera and a light on the end.  The tube will be inserted into your anus.  The tube will be gently eased through your rectum and colon.  Air will be delivered into your colon to keep it open. You may feel some pressure or cramping.  The camera will be used to take images during the procedure.  A small tissue sample may be removed to be examined under a microscope (biopsy).  If small polyps are found, your health care provider may remove them and have them checked for cancer cells.  When the exam is done, the tube will be removed. The procedure may vary among health care providers and hospitals. What happens after the procedure?  Your blood pressure, heart rate, breathing rate, and blood oxygen level will be monitored until the medicines you were given have worn off.  Do not drive for 24 hours after the exam.  You may have a small amount of blood in your stool.  You may pass gas and have mild abdominal cramping or bloating due to the air that was used to inflate your colon during the exam.  It is up to you to get the results of your procedure. Ask your health care provider, or the department performing the procedure, when your results will be ready. Summary  A colonoscopy is an exam to look at the entire large intestine.  During a colonoscopy, a lubricated, flexible tube with a camera on the end of it is inserted into the anus and then passed into the colon and other parts of the large intestine.  Follow instructions from your health care provider about eating and drinking before the procedure.  If you were prescribed an oral bowel prep to clean out your colon, take it as told by your health care provider.  After your procedure, your blood pressure, heart rate, breathing rate, and blood oxygen level will be monitored until the medicines you were given have worn off. This information is not intended to replace advice given to you by your health care provider. Make  sure you discuss any questions you have with your health care provider. Document Released: 05/02/2000 Document Revised: 02/25/2017 Document Reviewed: 07/17/2015 Elsevier Patient Education  2020 [employer].

## 2018-11-19 DIAGNOSIS — Z800 Family history of malignant neoplasm of digestive organs: Secondary | ICD-10-CM | POA: Insufficient documentation

## 2018-11-19 DIAGNOSIS — Z1211 Encounter for screening for malignant neoplasm of colon: Secondary | ICD-10-CM | POA: Insufficient documentation

## 2018-11-22 ENCOUNTER — Encounter: Payer: Self-pay | Admitting: Internal Medicine

## 2018-11-22 ENCOUNTER — Other Ambulatory Visit: Payer: Self-pay | Admitting: *Deleted

## 2018-11-22 ENCOUNTER — Other Ambulatory Visit: Payer: Self-pay

## 2018-11-22 ENCOUNTER — Inpatient Hospital Stay: Payer: Managed Care, Other (non HMO) | Attending: Internal Medicine | Admitting: Internal Medicine

## 2018-11-22 DIAGNOSIS — Z170 Estrogen receptor positive status [ER+]: Secondary | ICD-10-CM

## 2018-11-22 DIAGNOSIS — C50411 Malignant neoplasm of upper-outer quadrant of right female breast: Secondary | ICD-10-CM

## 2018-11-22 DIAGNOSIS — Z79810 Long term (current) use of selective estrogen receptor modulators (SERMs): Secondary | ICD-10-CM

## 2018-11-22 NOTE — Progress Notes (Signed)
I connected with Crystal Branch on 11/22/18 at  1:45 PM EDT by video enabled telemedicine visit and verified that I am speaking with the correct person using two identifiers.  I discussed the limitations, risks, security and privacy concerns of performing an evaluation and management service by telemedicine and the availability of in-person appointments. I also discussed with the patient that there may be a patient responsible charge related to this service. The patient expressed understanding and agreed to proceed.    Other persons participating in the visit and their role in the encounter: RN/medical reconciliation  Patient's location: Home Provider's location: office   Oncology History Overview Note  # May 2019- RIGHT BREAST CA s/p Lumpec & SLNBx [pT1c pN0 (sn); G-1;  margins clear; ER/PR > 90%; Her 2 neu- FISH-NEG. MAMMAPRINT [Dr.Byrnett]- HIGH RISK  # July 29th TC x4 [finished 02/15/2018] s/p RT [dec 11th 2019]; # jan 13th 2020- start Tam  # March 9th 2020- Triptorelinq 4q; starting June 2020- switch to q12w  # march 9th 2020- Zometa 4 mg/ adjuvant  GENETIC TESTING: [Dr.Byrnett] NEGATIVE FOR - ATM/ BRCA2/CHEK2/PTEN/TP53/BRCA1/CDH1/PALB2/ STK11   DIAGNOSIS: BREAST CA   STAGE:  I/mammaprint-H ;GOALS: cure  CURRENT/MOST RECENT THERAPY: Tam + Triptorelin    Malignant neoplasm of upper-outer quadrant of right breast in female, estrogen receptor positive ([place])  11/03/2017 Initial Diagnosis   Malignant neoplasm of upper-outer quadrant of right breast in female, estrogen receptor positive ([place])      Chief Complaint: Breast cancer   History of present illness:Crystal Branch 48 y.o.  female with history of stage I ER PR positive HER-2 negative high risk mammaprint-currently on tamoxifen/OFS is here for follow-up.  In the interim patient was evaluated by surgery.  She has been referred to gynecology for oophorectomy.  She is also supposed to get a screening colonoscopy in the next few  weeks.  She continues to complain of pain in her joints especially right hand.  It does interfere intermittently with her daily activities.  No trauma.  Observation/objective:  Assessment and plan: Malignant neoplasm of upper-outer quadrant of right breast in female, estrogen receptor positive ([place]) #Stage I ER PR positive HER-2 negative; HIGH risk mamma print.   On adjuvant Tamoxifen + OFS/ trelstar.   #Patient tolerating treatment with mild to moderate side effects [see joint pains below].  Discussed that patient has a good chance cure of her breast cancer.  Discussed with Dr. Tollie Pizza.  #Continue adjuvant Zometa every 6 months.  # joint pains- G-1-2; patient already taking Osteo Bi-Flex.  Exercising. Tylenol prn. STABLE. If worse- will get X-ray. Ice packs prn.    # Hot flashes-grade 1; stable  # Peripheral neuropathy- G-1; stable  # DISPOSITION: # Follow up in 2 months- MD video-Dr.B  Follow-up instructions:  I discussed the assessment and treatment plan with the patient.  The patient was provided an opportunity to ask questions and all were answered.  The patient agreed with the plan and demonstrated understanding of instructions.  The patient was advised to call back or seek an in person evaluation if the symptoms worsen or if the condition fails to improve as anticipated.  Dr. Charlaine Dalton [facility] at [facility] 11/22/2018 2:11 PM

## 2018-11-22 NOTE — Assessment & Plan Note (Addendum)
#  Stage I ER PR positive HER-2 negative; HIGH risk mamma print.   On adjuvant Tamoxifen + OFS/ trelstar.   #Patient tolerating treatment with mild to moderate side effects [see joint pains below].  Discussed that patient has a good chance cure of her breast cancer.  Discussed with Dr. Tollie Pizza.  #Continue adjuvant Zometa every 6 months.  # joint pains- G-1-2; patient already taking Osteo Bi-Flex.  Exercising. Tylenol prn. STABLE. If worse- will get X-ray. Ice packs prn.    # Hot flashes-grade 1; stable  # Peripheral neuropathy- G-1; stable  # DISPOSITION: # Follow up in 2 months- MD video-Dr.B

## 2018-11-24 ENCOUNTER — Other Ambulatory Visit: Payer: Self-pay

## 2018-11-24 ENCOUNTER — Encounter: Payer: Self-pay | Admitting: Obstetrics and Gynecology

## 2018-11-24 ENCOUNTER — Ambulatory Visit: Payer: Managed Care, Other (non HMO) | Admitting: Obstetrics and Gynecology

## 2018-11-24 VITALS — BP 118/65 | HR 63 | Ht 64.0 in | Wt 149.0 lb

## 2018-11-24 DIAGNOSIS — Z170 Estrogen receptor positive status [ER+]: Secondary | ICD-10-CM | POA: Diagnosis not present

## 2018-11-24 DIAGNOSIS — C50411 Malignant neoplasm of upper-outer quadrant of right female breast: Secondary | ICD-10-CM | POA: Diagnosis not present

## 2018-11-24 NOTE — Progress Notes (Signed)
Obstetrics & Gynecology Office Visit   Chief Complaint  Patient presents with  . surgery consult     surgical referring for surgery consult  Referral from Dr. [name], MD, at [facility] for possible oophorectomy due to history of ER/PR positive breast cancer of the right breast.  History of Present Illness: 48 y.o. G0P0000 female who is seen in referral at the request of Dr. Loa Socks, MD, from [facility] for consideration of bilateral salpingo-oophorectomy.  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  She has undergone lumptectomy, radiation therapy and is currently on tamoxifen and Triptorelinq and Zometa.  She has a history of atrial tachycardia that "flares" up every once in a while.  She states that she was told that there would be benefit from having her ovaries removed.  She has no acute complaints today. She is BRCA 1 and 2 negative.  She is MammaPrint high risk, which is a test that indicates risk of breast cancer recurrence.   Past Medical History:  Diagnosis Date  . BRCA negative 09/2016   [facility]  . Breast CA ([place]) 09/30/2017   INVASIVE MAMMARY CARCINOMA WITH MUCINOUS FEATURES/ ER/PR 90%; Her 2 neu: Negative.  Mammoprint: High risk.   Marland Kitchen Dysrhythmia   . Personal history of chemotherapy   . Personal history of radiation therapy     Past Surgical History:  Procedure Laterality Date  . 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 BREAST LUMPECTOMY Right 10/26/2017  . BREAST LUMPECTOMY WITH SENTINEL LYMPH NODE BIOPSY Right 10/26/2017   Procedure: BREAST LUMPECTOMY WITH SENTINEL LYMPH NODE BX;  Surgeon: Robert Bellow, MD;  Location: [facility] ORS;  Service: General;  Laterality: Right;  . TONSILLECTOMY  2015    Gynecologic History: No LMP recorded. (Menstrual status:  Chemotherapy).  Obstetric History: G0P0000  Family History  Problem Relation Age of Onset  . Breast cancer Maternal Aunt 60  . Breast cancer Maternal Grandmother 71  . Colon cancer Mother 22    Social History   Socioeconomic History  . Marital status: Single    Spouse name: Not on file  . Number of children: Not on file  . Years of education: Not on file  . Highest education level: Not on file  Occupational History  . Not on file  Social Needs  . Financial resource strain: Not on file  . Food insecurity    Worry: Not on file    Inability: Not on file  . Transportation needs    Medical: Not on file    Non-medical: Not on file  Tobacco Use  . Smoking status: Never Smoker  . Smokeless tobacco: Never Used  Substance and Sexual Activity  . Alcohol use: Never    Frequency: Never  . Drug use: Never  . Sexual activity: Not Currently    Birth control/protection: None  Lifestyle  . Physical activity    Days per week: Not on file    Minutes per session: Not on file  . Stress: Not on file  Relationships  . Social Herbalist on phone: Not on file    Gets together: Not on file    Attends religious service: Not on file    Active member of club or organization: Not on file    Attends meetings of clubs or organizations: Not on file  Relationship status: Not on file  . Intimate partner violence    Fear of current or ex partner: Not on file    Emotionally abused: Not on file    Physically abused: Not on file    Forced sexual activity: Not on file  Other Topics Concern  . Not on file  Social History Narrative  . Not on file   Allergies: No Known Allergies  Prior to Admission medications   Medication Sig Start Date End Date Taking? Authorizing Provider  acetaminophen (TYLENOL) 325 MG tablet Take 325 mg by mouth every 6 (six) hours as needed (for pain).   Yes [provider]  loratadine (CLARITIN) 10 MG tablet Take 1 tablet (10 mg total) by mouth  daily. 12/24/17  Yes Verlon Au, NP  Misc Natural Products (OSTEO BI-FLEX JOINT SHIELD PO) Take 1 capsule by mouth 2 (two) times daily.    Yes [provider]  Multiple Vitamin (MULTIVITAMIN WITH MINERALS) TABS tablet Take 1 tablet by mouth daily.   Yes [provider]  Probiotic Product ([place]) CAPS Take 1 capsule by mouth daily with supper.    Yes [provider]  tamoxifen (NOLVADEX) 20 MG tablet Take 1 tablet (20 mg total) by mouth daily. 06/09/18  Yes Cammie Sickle, MD  triptorelin (TRELESTAR LA) 11.25 MG injection Inject 11.25 mg into the muscle every 3 (three) months.   Yes [provider]  Zoledronic Acid (ZOMETA) 4 MG/100ML IVPB Inject 4 mg into the vein every 6 (six) months.   Yes [provider]    Review of Systems  Constitutional: Negative.   HENT: Negative.   Eyes: Negative.   Respiratory: Negative.   Cardiovascular: Negative.   Gastrointestinal: Negative.   Genitourinary: Negative.   Musculoskeletal: Negative.   Skin: Negative.   Neurological: Negative.   Psychiatric/Behavioral: Negative.      Physical Exam BP 118/65 (BP Location: Left Arm, Patient Position: Sitting, Cuff Size: Normal)   Pulse 63   Ht 5' 4"  (1.626 m)   Wt 149 lb (67.6 kg)   BMI 25.58 kg/m  No LMP recorded. (Menstrual status: Chemotherapy). Physical Exam Constitutional:      General: She is not in acute distress.    Appearance: Normal appearance. She is well-developed.  HENT:     Head: Normocephalic and atraumatic.  Eyes:     General: No scleral icterus.    Conjunctiva/sclera: Conjunctivae normal.  Neck:     Musculoskeletal: Normal range of motion and neck supple.  Cardiovascular:     Rate and Rhythm: Normal rate and regular rhythm.     Heart sounds: No murmur. No friction rub. No gallop.   Pulmonary:     Effort: Pulmonary effort is normal. No respiratory distress.     Breath sounds: Normal breath sounds. No wheezing or  rales.  Abdominal:     General: Bowel sounds are normal. There is no distension.     Palpations: Abdomen is soft. There is no mass.     Tenderness: There is no abdominal tenderness. There is no guarding or rebound.  Musculoskeletal: Normal range of motion.  Neurological:     General: No focal deficit present.     Mental Status: She is alert and oriented to person, place, and time.     Cranial Nerves: No cranial nerve deficit.  Skin:    General: Skin is warm and dry.     Findings: No erythema.  Psychiatric:  Mood and Affect: Mood normal.        Behavior: Behavior normal.        Judgment: Judgment normal.    Pelvic: deferred.   Assessment: 48 y.o. G0P0000 female here for  1. Malignant neoplasm of upper-outer quadrant of right breast in female, estrogen receptor positive ([place] [address])      Plan: Problem List Items Addressed This Visit      Other   Malignant neoplasm of upper-outer quadrant of right breast in female, estrogen receptor positive ([place]) - Primary     Discussed benefits of removing ovaries in this setting.  She is interested in having her ovaries removed.  Discussed also removing her fallopian tubes.  We also briefly discussed the risk of tamoxifen on uterine cancer.  While there is a slight increase in risk, general treatment is not to have a hysterectomy in patients who are taking tamoxifen.  She voiced understanding of this and desires the minimal surgery.  We will proceed with laparoscopic bilateral salpingo-oophorectomy.  All questions answered.  30 minutes spent in face to face discussion with > 50% spent in counseling,management, and coordination of care of her surgical consult for history of estrogen receptor positive breast cancer.   Prentice Docker, MD 11/24/2018 3:14 PM     CC: Robert Bellow, MD [address] [place],  [place] [address]

## 2018-11-25 ENCOUNTER — Telehealth: Payer: Self-pay | Admitting: Obstetrics and Gynecology

## 2018-11-25 NOTE — Telephone Encounter (Signed)
Patient was offered available OR dates in July and Aug. Patient is aware of H&P at [facility] on 12/31/18 @ 8:10am w/ Dr. Glennon Mac, Pre-admit testing and COVID testing to be scheduled, and Or on 01/06/19. Patient is aware she may receive calls from the [facility] and [facility]. Patient confirmed Christella Scheuermann, and no secondary insurance.

## 2018-11-25 NOTE — Telephone Encounter (Signed)
-----   Message from Will Bonnet, MD sent at 11/24/2018  3:21 PM EDT ----- Regarding: Schedule Surgery Surgery Booking Request Patient Full Name:  KENESHA MOSHIER  MRN: [id number]  DOB: 1971-04-17  Surgeon: Prentice Docker, MD  Requested Surgery Date and Time: TBD Primary Diagnosis AND Code: Malignant neoplasm of upper-outer quadrant of right breast in female, Estrogen receptor positive Secondary Diagnosis and Code:  Surgical Procedure: laparoscopic bilateral salpingo-oophorectomy L&D Notification: No Admission Status: same day surgery Length of Surgery: 45 minutes Special Case Needs: LigaSure 57mm H&P: TBD (date) Phone Interview???: no Interpreter: Language:  Medical Clearance: no Special Scheduling Instructions: none Acuity: P3

## 2018-11-26 ENCOUNTER — Other Ambulatory Visit: Payer: Self-pay

## 2018-11-26 ENCOUNTER — Other Ambulatory Visit
Admission: RE | Admit: 2018-11-26 | Discharge: 2018-11-26 | Disposition: A | Discharge status: Home / Self Care | Payer: Managed Care, Other (non HMO) | Source: Ambulatory Visit | Attending: General Surgery | Admitting: General Surgery

## 2018-11-26 DIAGNOSIS — Z01812 Encounter for preprocedural laboratory examination: Secondary | ICD-10-CM | POA: Insufficient documentation

## 2018-11-26 DIAGNOSIS — Z1159 Encounter for screening for other viral diseases: Secondary | ICD-10-CM | POA: Insufficient documentation

## 2018-11-26 LAB — SARS CORONAVIRUS 2 (TAT 6-24 HRS): SARS Coronavirus 2: NEGATIVE

## 2018-11-30 ENCOUNTER — Encounter: Payer: Self-pay | Admitting: *Deleted

## 2018-12-01 ENCOUNTER — Ambulatory Visit: Payer: Managed Care, Other (non HMO) | Admitting: Certified Registered Nurse Anesthetist

## 2018-12-01 ENCOUNTER — Other Ambulatory Visit: Payer: Self-pay

## 2018-12-01 ENCOUNTER — Ambulatory Visit
Admission: RE | Admit: 2018-12-01 | Discharge: 2018-12-01 | Disposition: A | Discharge status: Home / Self Care | Payer: Managed Care, Other (non HMO) | Attending: General Surgery | Admitting: General Surgery

## 2018-12-01 ENCOUNTER — Encounter
Admission: RE | Disposition: A | Discharge status: Home / Self Care | Payer: Self-pay | Source: Home / Self Care | Attending: General Surgery

## 2018-12-01 DIAGNOSIS — Z853 Personal history of malignant neoplasm of breast: Secondary | ICD-10-CM | POA: Diagnosis not present

## 2018-12-01 DIAGNOSIS — Z9221 Personal history of antineoplastic chemotherapy: Secondary | ICD-10-CM | POA: Diagnosis not present

## 2018-12-01 DIAGNOSIS — I499 Cardiac arrhythmia, unspecified: Secondary | ICD-10-CM | POA: Diagnosis not present

## 2018-12-01 DIAGNOSIS — Z923 Personal history of irradiation: Secondary | ICD-10-CM | POA: Diagnosis not present

## 2018-12-01 DIAGNOSIS — Z1211 Encounter for screening for malignant neoplasm of colon: Secondary | ICD-10-CM | POA: Insufficient documentation

## 2018-12-01 DIAGNOSIS — K5730 Diverticulosis of large intestine without perforation or abscess without bleeding: Secondary | ICD-10-CM | POA: Diagnosis not present

## 2018-12-01 HISTORY — PX: COLONOSCOPY WITH PROPOFOL: SHX5780

## 2018-12-01 SURGERY — COLONOSCOPY WITH PROPOFOL
Anesthesia: General

## 2018-12-01 MED ORDER — PROPOFOL 10 MG/ML IV BOLUS
INTRAVENOUS | Status: DC | PRN
  Administered 2018-12-01: 50 mg via INTRAVENOUS

## 2018-12-01 MED ORDER — SODIUM CHLORIDE 0.9 % IV SOLN
INTRAVENOUS | Status: DC
  Administered 2018-12-01: 08:00:00 via INTRAVENOUS

## 2018-12-01 MED ORDER — LIDOCAINE HCL (CARDIAC) PF 100 MG/5ML IV SOSY
PREFILLED_SYRINGE | INTRAVENOUS | Status: DC | PRN
  Administered 2018-12-01: 50 mg via INTRAVENOUS

## 2018-12-01 MED ORDER — PROPOFOL 500 MG/50ML IV EMUL
INTRAVENOUS | Status: DC | PRN
  Administered 2018-12-01: 175 ug/kg/min via INTRAVENOUS

## 2018-12-01 NOTE — H&P (Signed)
Crystal Branch [id number] April 17, 1971     HPI:  Patient for screening colonoscopy.   Medications Prior to Admission  Medication Sig Dispense Refill Last Dose  . acetaminophen (TYLENOL) 325 MG tablet Take 325 mg by mouth every 6 (six) hours as needed (for pain).   Past Week at Unknown time  . loratadine (CLARITIN) 10 MG tablet Take 1 tablet (10 mg total) by mouth daily.   Past Week at Unknown time  . Misc Natural Products (OSTEO BI-FLEX JOINT SHIELD PO) Take 1 capsule by mouth 2 (two) times daily.    Past Week at Unknown time  . Multiple Vitamin (MULTIVITAMIN WITH MINERALS) TABS tablet Take 1 tablet by mouth daily.   Past Week at Unknown time  . Probiotic Product ([place]) CAPS Take 1 capsule by mouth daily with supper.    Past Week at Unknown time  . tamoxifen (NOLVADEX) 20 MG tablet Take 1 tablet (20 mg total) by mouth daily. 90 tablet 2 Past Week at Unknown time  . triptorelin (TRELESTAR LA) 11.25 MG injection Inject 11.25 mg into the muscle every 3 (three) months.     . Zoledronic Acid (ZOMETA) 4 MG/100ML IVPB Inject 4 mg into the vein every 6 (six) months.      No Known Allergies Past Medical History:  Diagnosis Date  . BRCA negative 09/2016   [facility]  . Breast CA ([place]) 09/30/2017   INVASIVE MAMMARY CARCINOMA WITH MUCINOUS FEATURES/ ER/PR 90%; Her 2 neu: Negative.  Mammoprint: High risk.   Marland Kitchen Dysrhythmia   . Personal history of chemotherapy   . Personal history of radiation therapy    Past Surgical History:  Procedure Laterality Date  . 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 BREAST LUMPECTOMY Right 10/26/2017  . BREAST LUMPECTOMY WITH SENTINEL LYMPH NODE BIOPSY Right 10/26/2017   Procedure: BREAST LUMPECTOMY WITH SENTINEL LYMPH NODE BX;  Surgeon: Robert Bellow, MD;  Location: [facility] ORS;  Service: General;  Laterality: Right;  . TONSILLECTOMY  2015   Social History   Socioeconomic  History  . Marital status: Single    Spouse name: Not on file  . Number of children: Not on file  . Years of education: Not on file  . Highest education level: Not on file  Occupational History  . Not on file  Social Needs  . Financial resource strain: Not on file  . Food insecurity    Worry: Not on file    Inability: Not on file  . Transportation needs    Medical: Not on file    Non-medical: Not on file  Tobacco Use  . Smoking status: Never Smoker  . Smokeless tobacco: Never Used  Substance and Sexual Activity  . Alcohol use: Never    Frequency: Never  . Drug use: Never  . Sexual activity: Not Currently    Birth control/protection: None  Lifestyle  . Physical activity    Days per week: Not on file    Minutes per session: Not on file  . Stress: Not on file  Relationships  . Social Herbalist on phone: Not on file    Gets together: Not on file    Attends religious service: Not on file    Active member of club or organization: Not on file    Attends meetings of clubs or organizations: Not on file    Relationship status: Not on file  .  Intimate partner violence    Fear of current or ex partner: Not on file    Emotionally abused: Not on file    Physically abused: Not on file    Forced sexual activity: Not on file  Other Topics Concern  . Not on file  Social History Narrative  . Not on file   Social History   Social History Narrative  . Not on file     ROS: Negative.     PE: HEENT: Negative. Lungs: Clear. Cardio: RR.  Assessment/Plan:  Proceed with planned endoscopy. [facility] 12/01/2018

## 2018-12-01 NOTE — Anesthesia Post-op Follow-up Note (Signed)
Anesthesia QCDR form completed.        

## 2018-12-01 NOTE — Transfer of Care (Signed)
Immediate Anesthesia Transfer of Care Note  Patient: Crystal Branch  Procedure(s) Performed: COLONOSCOPY WITH PROPOFOL (N/A )  Patient Location: PACU  Anesthesia Type:General  Level of Consciousness: awake and drowsy  Airway & Oxygen Therapy: Patient Spontanous Breathing and Patient connected to nasal cannula oxygen  Post-op Assessment: Report given to RN and Post -op Vital signs reviewed and stable  Post vital signs: Reviewed and stable  Last Vitals:  Vitals Value Taken Time  BP 94/46 12/01/18 0853  Temp 36.2 C 12/01/18 0853  Pulse 58 12/01/18 0853  Resp 21 12/01/18 0853  SpO2 100 % 12/01/18 0853    Last Pain:  Vitals:   12/01/18 0853  TempSrc: Tympanic  PainSc: 0-No pain         Complications: No apparent anesthesia complications

## 2018-12-01 NOTE — Op Note (Signed)
[facility] Gastroenterology Patient Name: Crystal Branch Procedure Date: 12/01/2018 8:22 AM MRN: [id number] Account #: [id number] Date of Birth: [birth year] Admit Type: Outpatient Age: 48 Room: [facility] ENDO ROOM 1 Gender: Female Note Status: Finalized Procedure:            Colonoscopy Indications:          Screening for colorectal malignant neoplasm Providers:            Robert Bellow, MD Medicines:            Monitored Anesthesia Care Complications:        No immediate complications. Procedure:            Pre-Anesthesia Assessment:                       - Prior to the procedure, a History and Physical was                        performed, and patient medications, allergies and                        sensitivities were reviewed. The patient's tolerance of                        previous anesthesia was reviewed.                       - The risks and benefits of the procedure and the                        sedation options and risks were discussed with the                        patient. All questions were answered and informed                        consent was obtained.                       After obtaining informed consent, the colonoscope was                        passed under direct vision. Throughout the procedure,                        the patient's blood pressure, pulse, and oxygen                        saturations were monitored continuously. The                        Colonoscope was introduced through the anus and                        advanced to the the cecum, identified by appendiceal                        orifice and ileocecal valve. The colonoscopy was                        performed without difficulty. The patient tolerated the  procedure well. The quality of the bowel preparation                        was excellent. Findings:      A few large-mouthed diverticula were found in the sigmoid colon.      The retroflexed view  of the distal rectum and anal verge was normal and       showed no anal or rectal abnormalities. Impression:           - Diverticulosis in the sigmoid colon.                       - The distal rectum and anal verge are normal on                        retroflexion view.                       - No specimens collected. Recommendation:       - Repeat colonoscopy in 5 years for surveillance. Procedure Code(s):    --- Professional ---                       [id number], Colonoscopy, flexible; diagnostic, including                        collection of specimen(s) by brushing or washing, when                        performed (separate procedure) Diagnosis Code(s):    --- Professional ---                       Z12.11, Encounter for screening for malignant neoplasm                        of colon                       K57.30, Diverticulosis of large intestine without                        perforation or abscess without bleeding CPT copyright 2019 American [employer] Association. All rights reserved. The codes documented in this report are preliminary and upon coder review may  be revised to meet current compliance requirements. Robert Bellow, MD 12/01/2018 8:49:10 AM This report has been signed electronically. Number of Addenda: 0 Note Initiated On: 12/01/2018 8:22 AM Scope Withdrawal Time: 0 hours 7 minutes 57 seconds  Total Procedure Duration: 0 hours 17 minutes 3 seconds  Estimated Blood Loss: Estimated blood loss: none.      [facility]

## 2018-12-01 NOTE — Anesthesia Preprocedure Evaluation (Addendum)
Anesthesia Evaluation  Patient identified by MRN, date of birth, ID band Patient awake    Reviewed: Allergy & Precautions, H&P , NPO status , Patient's Chart, lab work & pertinent test results  Airway Mallampati: II  TM Distance: >3 FB Neck ROM: full    Dental  (+) Teeth Intact   Pulmonary neg pulmonary ROS, neg shortness of breath, neg COPD, neg recent URI,           Cardiovascular (-) angina(-) Past MI and (-) Cardiac Stents + dysrhythmias ("atrial tachycardia")      Neuro/Psych negative neurological ROS  negative psych ROS   GI/Hepatic negative GI ROS, Neg liver ROS,   Endo/Other  negative endocrine ROS  Renal/GU negative Renal ROS  negative genitourinary   Musculoskeletal   Abdominal   Peds  Hematology negative hematology ROS (+)   Anesthesia Other Findings Past Medical History: 09/2016: BRCA negative     Comment:  [facility] 09/30/2017: Breast CA ([place])     Comment:  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  Mammoprint: High risk.  No date: Dysrhythmia No date: Personal history of chemotherapy No date: Personal history of radiation therapy  Past Surgical History: 09/30/2017: BREAST BIOPSY; Right     Comment:  US guided biopsy, INVASIVE MAMMARY CARCINOMA WITH               MUCINOUS FEATURES ER/PR positive 10/26/2017: BREAST LUMPECTOMY; Right 10/26/2017: BREAST LUMPECTOMY WITH SENTINEL LYMPH NODE BIOPSY; Right     Comment:  Procedure: BREAST LUMPECTOMY WITH SENTINEL LYMPH NODE               BX;  Surgeon: Robert Bellow, MD;  Location: [facility]               ORS;  Service: General;  Laterality: Right; 2015: TONSILLECTOMY  BMI    Body Mass Index: 25.75 kg/m      Reproductive/Obstetrics negative OB ROS                             Anesthesia Physical Anesthesia Plan  ASA: II  Anesthesia Plan: General   Post-op Pain  Management:    Induction:   PONV Risk Score and Plan: Propofol infusion and TIVA  Airway Management Planned: Natural Airway and Nasal Cannula  Additional Equipment:   Intra-op Plan:   Post-operative Plan:   Informed Consent: I have reviewed the patients History and Physical, chart, labs and discussed the procedure including the risks, benefits and alternatives for the proposed anesthesia with the patient or authorized representative who has indicated his/her understanding and acceptance.     Dental Advisory Given  Plan Discussed with: Anesthesiologist and CRNA  Anesthesia Plan Comments:         Anesthesia Quick Evaluation

## 2018-12-01 NOTE — Anesthesia Procedure Notes (Signed)
Date/Time: 12/01/2018 8:30 AM Performed by: Doreen Salvage, CRNA Pre-anesthesia Checklist: Patient identified, Emergency Drugs available, Suction available and Patient being monitored Patient Re-evaluated:Patient Re-evaluated prior to induction Oxygen Delivery Method: Nasal cannula Induction Type: IV induction Dental Injury: Teeth and Oropharynx as per pre-operative assessment  Comments: Nasal cannula with etCO2 monitoring

## 2018-12-01 NOTE — Anesthesia Postprocedure Evaluation (Signed)
Anesthesia Post Note  Patient: Crystal Branch  Procedure(s) Performed: COLONOSCOPY WITH PROPOFOL (N/A )  Patient location during evaluation: PACU Anesthesia Type: General Level of consciousness: awake and alert Pain management: pain level controlled Vital Signs Assessment: post-procedure vital signs reviewed and stable Respiratory status: spontaneous breathing, nonlabored ventilation and respiratory function stable Cardiovascular status: blood pressure returned to baseline and stable Postop Assessment: no apparent nausea or vomiting Anesthetic complications: no     Last Vitals:  Vitals:   12/01/18 0738 12/01/18 0853  BP: (!) 108/55 (!) 94/46  Pulse: (!) 52 (!) 58  Resp: 16 (!) 21  Temp: (!) 36.3 C (!) 36.2 C  SpO2: 100% 100%    Last Pain:  Vitals:   12/01/18 0853  TempSrc: Tympanic  PainSc: 0-No pain                 Durenda Hurt

## 2018-12-02 ENCOUNTER — Encounter: Payer: Self-pay | Admitting: General Surgery

## 2018-12-17 ENCOUNTER — Encounter: Payer: Self-pay | Admitting: General Surgery

## 2018-12-31 ENCOUNTER — Encounter: Payer: Self-pay | Admitting: Obstetrics and Gynecology

## 2018-12-31 ENCOUNTER — Other Ambulatory Visit: Payer: Self-pay

## 2018-12-31 ENCOUNTER — Ambulatory Visit (INDEPENDENT_AMBULATORY_CARE_PROVIDER_SITE_OTHER): Payer: Managed Care, Other (non HMO) | Admitting: Obstetrics and Gynecology

## 2018-12-31 VITALS — BP 124/70 | Ht 64.0 in | Wt 146.0 lb

## 2018-12-31 DIAGNOSIS — Z170 Estrogen receptor positive status [ER+]: Secondary | ICD-10-CM

## 2018-12-31 DIAGNOSIS — C50411 Malignant neoplasm of upper-outer quadrant of right female breast: Secondary | ICD-10-CM

## 2018-12-31 NOTE — Progress Notes (Signed)
Preoperative History and Physical  Crystal Branch is a 48 y.o. Crystal Branch here for surgical management of ER/PR positive breast cancer risk reduction.   No significant preoperative concerns.  History of Present Illness: Crystal y.o. Crystal Branch female who is seen in referral at the request of Dr. Loa Socks, MD, from [facility] for consideration of bilateral salpingo-oophorectomy.  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  Crystal Branch has undergone lumptectomy, radiation therapy and is currently on tamoxifen and Triptorelinq and Zometa.  Crystal Branch has a history of atrial tachycardia that "flares" up every once in a while.  Crystal Branch states that Crystal Branch was told that there would be benefit from having Crystal Branch ovaries removed.  Crystal Branch has no acute complaints today. Crystal Branch is BRCA 1 and 2 negative.  Crystal Branch is MammaPrint high risk, which is a test that indicates risk of breast cancer recurrence.   Proposed surgery: laparoscopic bilateral salpingo-oophorectomy  Past Medical History:  Diagnosis Date  . BRCA negative 09/2016   [facility]  . 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  Mammoprint: High risk.   Marland Kitchen Dysrhythmia   . Personal history of chemotherapy   . Personal history of radiation therapy    Past Surgical History:  Procedure Laterality Date  . 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 BREAST LUMPECTOMY Right 10/26/2017  . BREAST LUMPECTOMY WITH SENTINEL LYMPH NODE BIOPSY Right 10/26/2017   Procedure: BREAST LUMPECTOMY WITH SENTINEL LYMPH NODE BX;  Surgeon: Robert Bellow, MD;  Location: [facility] ORS;  Service: General;  Laterality: Right;  . COLONOSCOPY WITH PROPOFOL N/A 12/01/2018   Procedure: COLONOSCOPY WITH PROPOFOL;  Surgeon: Robert Bellow, MD;  Location: [facility] ENDOSCOPY;  Service: Endoscopy;   Laterality: N/A;  . TONSILLECTOMY  2015   OB History  Gravida Para Term Preterm AB Living  0 0 0 0 0 0  SAB TAB Ectopic Multiple Live Births  0 0 0 0 0  Obstetric Comments  1st Menstrual Cycle:   10     Patient denies any other pertinent gynecologic issues.   Current Outpatient Medications on File Prior to Visit  Medication Sig Dispense Refill  . acetaminophen (TYLENOL) 325 MG tablet Take 325-650 mg by mouth every 6 (six) hours as needed (for pain).     . Calcium Carb-Cholecalciferol (CALCIUM-VITAMIN D3) 600-500 MG-UNIT CAPS Take 1 capsule by mouth 2 (two) times daily.    . fluticasone (FLONASE) 50 MCG/ACT nasal spray Place 1 spray into both nostrils daily as needed for allergies or rhinitis.    . hydrocortisone cream 1 % Apply 1 application topically daily as needed for itching.    . loratadine (CLARITIN) 10 MG tablet Take 1 tablet (10 mg total) by mouth daily. (Patient taking differently: Take 10 mg by mouth daily as needed for allergies. )    . Misc Natural Products (OSTEO BI-FLEX JOINT SHIELD PO) Take 1 capsule by mouth 2 (two) times daily.     . Multiple Vitamin (MULTIVITAMIN WITH MINERALS) TABS tablet Take 1 tablet by mouth daily.    . Probiotic Product (Daniels) CAPS Take 1 capsule by mouth every evening.     . tamoxifen (NOLVADEX) 20 MG tablet Take 1 tablet (20 mg total) by mouth daily. (Patient taking differently: Take 20 mg by mouth every evening. ) 90  tablet 2  . triptorelin (TRELESTAR LA) 11.25 MG injection Inject 11.25 mg into the muscle every 3 (three) months.    . Zoledronic Acid (ZOMETA) 4 MG/100ML IVPB Inject 4 mg into the vein every 6 (six) months.     No current facility-administered medications on file prior to visit.    No Known Allergies  Social History:   reports that Crystal Branch has never smoked. Crystal Branch has never used smokeless tobacco. Crystal Branch reports that Crystal Branch does not drink alcohol or use drugs.  Family History  Problem Relation Age of Onset  . Breast  cancer Maternal Aunt 60  . Breast cancer Maternal Grandmother 72  . Colon cancer Mother 61    Review of Systems: Noncontributory  PHYSICAL EXAM: Blood pressure 124/70, height _0  (1.626 m), weight 146 lb (66.2 kg). CONSTITUTIONAL: Well-developed, well-nourished female in no acute distress.  HENT:  Normocephalic, atraumatic, External right and left ear normal. Oropharynx is clear and moist EYES: Conjunctivae and EOM are normal. Pupils are equal, round, and reactive to light. No scleral icterus.  NECK: Normal range of motion, supple, no masses SKIN: Skin is warm and dry. No rash noted. Not diaphoretic. No erythema. No pallor. Hughes: Alert and oriented to person, place, and time. Normal reflexes, muscle tone coordination. No cranial nerve deficit noted. PSYCHIATRIC: Normal mood and affect. Normal behavior. Normal judgment and thought content. CARDIOVASCULAR: Normal heart rate noted, regular rhythm RESPIRATORY: Effort and breath sounds normal, no problems with respiration noted ABDOMEN: Soft, nontender, nondistended. PELVIC: Deferred MUSCULOSKELETAL: Normal range of motion. No edema and no tenderness. 2+ distal pulses.  Labs: No results found for this or any previous visit (from the past 336 hour(s)).  Imaging Studies: No results found.  Assessment: Patient Active Problem List   Diagnosis Date Noted  . Malignant neoplasm of upper-outer quadrant of right breast in female, estrogen receptor positive ([place]) 11/03/2017    Plan: Patient will undergo surgical management with the above-noted surgery.   The risks of surgery were discussed in detail with the patient including but not limited to: bleeding which may require transfusion or reoperation; infection which may require antibiotics; injury to surrounding organs which may involve bowel, bladder, ureters ; need for additional procedures including laparoscopy or laparotomy; thromboembolic phenomenon, surgical site problems and other  postoperative/anesthesia complications. Likelihood of success in alleviating the patient's condition was discussed. Routine postoperative instructions will be reviewed with the patient and Crystal Branch family in detail after surgery.  The patient concurred with the proposed plan, giving informed written consent for the surgery.  Preoperative prophylactic antibiotics, as indicated, and SCDs ordered on call to the OR.    Prentice Docker, MD 12/31/2018 8:18 AM

## 2018-12-31 NOTE — H&P (View-Only) (Signed)
Preoperative History and Physical  Crystal Branch is a 48 y.o. G0P0000 here for surgical management of ER/PR positive breast cancer risk reduction.   No significant preoperative concerns.  History of Present Illness: 48 y.o. G0P0000 female who is seen in referral at the request of Dr. Loa Socks, MD, from [facility] for consideration of bilateral salpingo-oophorectomy.  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  She has undergone lumptectomy, radiation therapy and is currently on tamoxifen and Triptorelinq and Zometa.  She has a history of atrial tachycardia that "flares" up every once in a while.  She states that she was told that there would be benefit from having her ovaries removed.  She has no acute complaints today. She is BRCA 1 and 2 negative.  She is MammaPrint high risk, which is a test that indicates risk of breast cancer recurrence.   Proposed surgery: laparoscopic bilateral salpingo-oophorectomy  Past Medical History:  Diagnosis Date  . BRCA negative 09/2016   [facility]  . 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  Mammoprint: High risk.   Marland Kitchen Dysrhythmia   . Personal history of chemotherapy   . Personal history of radiation therapy    Past Surgical History:  Procedure Laterality Date  . 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 BREAST LUMPECTOMY Right 10/26/2017  . BREAST LUMPECTOMY WITH SENTINEL LYMPH NODE BIOPSY Right 10/26/2017   Procedure: BREAST LUMPECTOMY WITH SENTINEL LYMPH NODE BX;  Surgeon: Robert Bellow, MD;  Location: [facility] ORS;  Service: General;  Laterality: Right;  . COLONOSCOPY WITH PROPOFOL N/A 12/01/2018   Procedure: COLONOSCOPY WITH PROPOFOL;  Surgeon: Robert Bellow, MD;  Location: [facility] ENDOSCOPY;  Service: Endoscopy;   Laterality: N/A;  . TONSILLECTOMY  2015   OB History  Gravida Para Term Preterm AB Living  0 0 0 0 0 0  SAB TAB Ectopic Multiple Live Births  0 0 0 0 0  Obstetric Comments  1st Menstrual Cycle:   10     Patient denies any other pertinent gynecologic issues.   Current Outpatient Medications on File Prior to Visit  Medication Sig Dispense Refill  . acetaminophen (TYLENOL) 325 MG tablet Take 325-650 mg by mouth every 6 (six) hours as needed (for pain).     . Calcium Carb-Cholecalciferol (CALCIUM-VITAMIN D3) 600-500 MG-UNIT CAPS Take 1 capsule by mouth 2 (two) times daily.    . fluticasone (FLONASE) 50 MCG/ACT nasal spray Place 1 spray into both nostrils daily as needed for allergies or rhinitis.    . hydrocortisone cream 1 % Apply 1 application topically daily as needed for itching.    . loratadine (CLARITIN) 10 MG tablet Take 1 tablet (10 mg total) by mouth daily. (Patient taking differently: Take 10 mg by mouth daily as needed for allergies. )    . Misc Natural Products (OSTEO BI-FLEX JOINT SHIELD PO) Take 1 capsule by mouth 2 (two) times daily.     . Multiple Vitamin (MULTIVITAMIN WITH MINERALS) TABS tablet Take 1 tablet by mouth daily.    . Probiotic Product (Daniels) CAPS Take 1 capsule by mouth every evening.     . tamoxifen (NOLVADEX) 20 MG tablet Take 1 tablet (20 mg total) by mouth daily. (Patient taking differently: Take 20 mg by mouth every evening. ) 90  tablet 2  . triptorelin (TRELESTAR LA) 11.25 MG injection Inject 11.25 mg into the muscle every 3 (three) months.    . Zoledronic Acid (ZOMETA) 4 MG/100ML IVPB Inject 4 mg into the vein every 6 (six) months.     No current facility-administered medications on file prior to visit.    No Known Allergies  Social History:   reports that she has never smoked. She has never used smokeless tobacco. She reports that she does not drink alcohol or use drugs.  Family History  Problem Relation Age of Onset  . Breast  cancer Maternal Aunt 60  . Breast cancer Maternal Grandmother 72  . Colon cancer Mother 61    Review of Systems: Noncontributory  PHYSICAL EXAM: Blood pressure 124/70, height _0  (1.626 m), weight 146 lb (66.2 kg). CONSTITUTIONAL: Well-developed, well-nourished female in no acute distress.  HENT:  Normocephalic, atraumatic, External right and left ear normal. Oropharynx is clear and moist EYES: Conjunctivae and EOM are normal. Pupils are equal, round, and reactive to light. No scleral icterus.  NECK: Normal range of motion, supple, no masses SKIN: Skin is warm and dry. No rash noted. Not diaphoretic. No erythema. No pallor. Hughes: Alert and oriented to person, place, and time. Normal reflexes, muscle tone coordination. No cranial nerve deficit noted. PSYCHIATRIC: Normal mood and affect. Normal behavior. Normal judgment and thought content. CARDIOVASCULAR: Normal heart rate noted, regular rhythm RESPIRATORY: Effort and breath sounds normal, no problems with respiration noted ABDOMEN: Soft, nontender, nondistended. PELVIC: Deferred MUSCULOSKELETAL: Normal range of motion. No edema and no tenderness. 2+ distal pulses.  Labs: No results found for this or any previous visit (from the past 336 hour(s)).  Imaging Studies: No results found.  Assessment: Patient Active Problem List   Diagnosis Date Noted  . Malignant neoplasm of upper-outer quadrant of right breast in female, estrogen receptor positive ([place]) 11/03/2017    Plan: Patient will undergo surgical management with the above-noted surgery.   The risks of surgery were discussed in detail with the patient including but not limited to: bleeding which may require transfusion or reoperation; infection which may require antibiotics; injury to surrounding organs which may involve bowel, bladder, ureters ; need for additional procedures including laparoscopy or laparotomy; thromboembolic phenomenon, surgical site problems and other  postoperative/anesthesia complications. Likelihood of success in alleviating the patient's condition was discussed. Routine postoperative instructions will be reviewed with the patient and her family in detail after surgery.  The patient concurred with the proposed plan, giving informed written consent for the surgery.  Preoperative prophylactic antibiotics, as indicated, and SCDs ordered on call to the OR.    Prentice Docker, MD 12/31/2018 8:18 AM

## 2019-01-03 ENCOUNTER — Encounter
Admission: RE | Admit: 2019-01-03 | Discharge: 2019-01-03 | Disposition: A | Discharge status: Home / Self Care | Payer: Managed Care, Other (non HMO) | Source: Ambulatory Visit | Attending: Obstetrics and Gynecology | Admitting: Obstetrics and Gynecology

## 2019-01-03 ENCOUNTER — Other Ambulatory Visit: Payer: Self-pay

## 2019-01-03 DIAGNOSIS — Z79899 Other long term (current) drug therapy: Secondary | ICD-10-CM | POA: Diagnosis not present

## 2019-01-03 DIAGNOSIS — Z01818 Encounter for other preprocedural examination: Secondary | ICD-10-CM | POA: Insufficient documentation

## 2019-01-03 DIAGNOSIS — Z9221 Personal history of antineoplastic chemotherapy: Secondary | ICD-10-CM | POA: Diagnosis not present

## 2019-01-03 DIAGNOSIS — Z923 Personal history of irradiation: Secondary | ICD-10-CM | POA: Diagnosis not present

## 2019-01-03 DIAGNOSIS — Z853 Personal history of malignant neoplasm of breast: Secondary | ICD-10-CM | POA: Diagnosis not present

## 2019-01-03 DIAGNOSIS — Z4002 Encounter for prophylactic removal of ovary: Secondary | ICD-10-CM | POA: Diagnosis not present

## 2019-01-03 DIAGNOSIS — Z20828 Contact with and (suspected) exposure to other viral communicable diseases: Secondary | ICD-10-CM | POA: Insufficient documentation

## 2019-01-03 LAB — COMPREHENSIVE METABOLIC PANEL
ALT: 21 U/L (ref 0–44)
AST: 26 U/L (ref 15–41)
Albumin: 4.6 g/dL (ref 3.5–5.0)
Alkaline Phosphatase: 33 U/L — ABNORMAL LOW (ref 38–126)
Anion gap: 5 (ref 5–15)
BUN: 12 mg/dL (ref 6–20)
CO2: 29 mmol/L (ref 22–32)
Calcium: 9.3 mg/dL (ref 8.9–10.3)
Chloride: 107 mmol/L (ref 98–111)
Creatinine, Ser: 0.68 mg/dL (ref 0.44–1.00)
GFR calc Af Amer: >60 mL/min (ref >60)
GFR calc non Af Amer: >60 mL/min (ref >60)
Glucose, Bld: 88 mg/dL (ref 70–99)
Potassium: 4.2 mmol/L (ref 3.5–5.1)
Sodium: 141 mmol/L (ref 135–145)
Total Bilirubin: 0.7 mg/dL (ref 0.3–1.2)
Total Protein: 6.9 g/dL (ref 6.5–8.1)

## 2019-01-03 LAB — TYPE AND SCREEN
ABO/RH(D): A NEG
Antibody Screen: NEGATIVE

## 2019-01-03 LAB — CBC
HCT: 38.3 % (ref 36.0–46.0)
Hemoglobin: 12.6 g/dL (ref 12.0–15.0)
MCH: 34.2 pg — ABNORMAL HIGH (ref 26.0–34.0)
MCHC: 32.9 g/dL (ref 30.0–36.0)
MCV: 104.1 fL — ABNORMAL HIGH (ref 80.0–100.0)
Platelets: 187 10*3/uL (ref 150–400)
RBC: 3.68 MIL/uL — ABNORMAL LOW (ref 3.87–5.11)
RDW: 11.9 % (ref 11.5–15.5)
WBC: 3.3 10*3/uL — ABNORMAL LOW (ref 4.0–10.5)
nRBC: 0 % (ref 0.0–0.2)

## 2019-01-03 LAB — SARS CORONAVIRUS 2 (TAT 6-24 HRS): SARS Coronavirus 2: NEGATIVE

## 2019-01-03 NOTE — Patient Instructions (Signed)
Your procedure is scheduled on: 01/06/19 Report to Day Surgery.[facility]  [facility]OND FLOOR To find out your arrival time please call [phone] between 1PM - 3PM on 01/05/19.  Remember: Instructions that are not followed completely may result in serious medical risk,  up to and including death, or upon the discretion of your surgeon and anesthesiologist your  surgery may need to be rescheduled.     _X__ 1. Do not eat food after midnight the night before your procedure.                 No gum chewing or hard candies. You may drink clear liquids up to 2 hours                 before you are scheduled to arrive for your surgery- DO not drink clear                 liquids within 2 hours of the start of your surgery.                 Clear Liquids include:  water, apple juice without pulp, clear carbohydrate                 drink such as Clearfast of Gatorade, Black Coffee or Tea (Do not add                 anything to coffee or tea). FINISH CARB DRINK 3 FULL HOURS BEFORE ARRIVAL DAY OF SURGERY  __X__2.  On the morning of surgery brush your teeth with toothpaste and water, you                may rinse your mouth with mouthwash if you wish.  Do not swallow any toothpaste of mouthwash.     _X__ 3.  No Alcohol for 24 hours before or after surgery.   _X__ 4.  Do Not Smoke or use e-cigarettes For 24 Hours Prior to Your Surgery.                 Do not use any chewable tobacco products for at least 6 hours prior to                 surgery.  ____  5.  Bring all medications with you on the day of surgery if instructed.   __X__  6.  Notify your doctor if there is any change in your medical condition      (cold, fever, infections).     Do not wear jewelry, make-up, hairpins, clips or nail polish. Do not wear lotions, powders, or perfumes. You may wear deodorant. Do not shave 48 hours prior to surgery. Men may shave face and neck. Do not bring valuables to the hospital.     [facility] is not responsible for any belongings or valuables.  Contacts, dentures or bridgework may not be worn into surgery. Leave your suitcase in the car. After surgery it may be brought to your room. For patients admitted to the hospital, discharge time is determined by your treatment team.   Patients discharged the day of surgery will not be allowed to drive home.   Please read over the following fact sheets that you were given:   Surgical Site Infection Prevention          ____ Take these medicines the morning of surgery with A SIP OF WATER:    1. NONE  2.   3.   4.  5.  6.  ____ Fleet Enema (as directed)   _X___ Use CHG Soap as directed  ____ Use inhalers on the day of surgery  ____ Stop metformin 2 days prior to surgery    ____ Take 1/2 of usual insulin dose the night before surgery. No insulin the morning          of surgery.   ____ Stop Coumadin/Plavix/aspirin on   ____ Stop Anti-inflammatories on    ____ Stop supplements until after surgery.    ____ Bring C-Pap to the hospital.

## 2019-01-06 ENCOUNTER — Ambulatory Visit: Payer: Managed Care, Other (non HMO) | Admitting: Anesthesiology

## 2019-01-06 ENCOUNTER — Ambulatory Visit
Admission: RE | Admit: 2019-01-06 | Discharge: 2019-01-06 | Disposition: A | Discharge status: Home / Self Care | Payer: Managed Care, Other (non HMO) | Attending: Obstetrics and Gynecology | Admitting: Obstetrics and Gynecology

## 2019-01-06 ENCOUNTER — Other Ambulatory Visit: Payer: Self-pay

## 2019-01-06 ENCOUNTER — Encounter: Payer: Self-pay | Admitting: *Deleted

## 2019-01-06 ENCOUNTER — Encounter
Admission: RE | Disposition: A | Discharge status: Home / Self Care | Payer: Self-pay | Source: Home / Self Care | Attending: Obstetrics and Gynecology

## 2019-01-06 DIAGNOSIS — C50411 Malignant neoplasm of upper-outer quadrant of right female breast: Secondary | ICD-10-CM

## 2019-01-06 DIAGNOSIS — Z4002 Encounter for prophylactic removal of ovary: Secondary | ICD-10-CM | POA: Insufficient documentation

## 2019-01-06 DIAGNOSIS — Z923 Personal history of irradiation: Secondary | ICD-10-CM | POA: Insufficient documentation

## 2019-01-06 DIAGNOSIS — Z170 Estrogen receptor positive status [ER+]: Secondary | ICD-10-CM | POA: Diagnosis not present

## 2019-01-06 DIAGNOSIS — Z20828 Contact with and (suspected) exposure to other viral communicable diseases: Secondary | ICD-10-CM | POA: Insufficient documentation

## 2019-01-06 DIAGNOSIS — Z853 Personal history of malignant neoplasm of breast: Secondary | ICD-10-CM | POA: Diagnosis not present

## 2019-01-06 DIAGNOSIS — Z79899 Other long term (current) drug therapy: Secondary | ICD-10-CM | POA: Insufficient documentation

## 2019-01-06 DIAGNOSIS — Z9221 Personal history of antineoplastic chemotherapy: Secondary | ICD-10-CM | POA: Insufficient documentation

## 2019-01-06 DIAGNOSIS — Z9079 Acquired absence of other genital organ(s): Secondary | ICD-10-CM

## 2019-01-06 DIAGNOSIS — Z90722 Acquired absence of ovaries, bilateral: Secondary | ICD-10-CM

## 2019-01-06 HISTORY — PX: LAPAROSCOPIC BILATERAL SALPINGO OOPHERECTOMY: SHX5890

## 2019-01-06 LAB — POCT PREGNANCY, URINE: Preg Test, Ur: NEGATIVE

## 2019-01-06 LAB — ABO/RH: ABO/RH(D): A NEG

## 2019-01-06 SURGERY — SALPINGO-OOPHORECTOMY, BILATERAL, LAPAROSCOPIC
Anesthesia: General

## 2019-01-06 MED ORDER — LIDOCAINE HCL (PF) 2 % IJ SOLN
INTRAMUSCULAR | Status: AC
  Filled 2019-01-06: qty 10

## 2019-01-06 MED ORDER — KETOROLAC TROMETHAMINE 30 MG/ML IJ SOLN
INTRAMUSCULAR | Status: DC | PRN
  Administered 2019-01-06: 30 mg via INTRAVENOUS

## 2019-01-06 MED ORDER — PROPOFOL 10 MG/ML IV BOLUS
INTRAVENOUS | Status: AC
  Filled 2019-01-06: qty 20

## 2019-01-06 MED ORDER — FAMOTIDINE 20 MG PO TABS
20.0000 mg | ORAL_TABLET | Freq: Once | ORAL | Status: AC
  Administered 2019-01-06: 13:00:00 20 mg via ORAL

## 2019-01-06 MED ORDER — ONDANSETRON HCL 4 MG/2ML IJ SOLN
INTRAMUSCULAR | Status: DC | PRN
  Administered 2019-01-06: 4 mg via INTRAVENOUS

## 2019-01-06 MED ORDER — KETOROLAC TROMETHAMINE 30 MG/ML IJ SOLN: 30.0000 mg | Freq: Once | INTRAMUSCULAR | Status: DC | PRN

## 2019-01-06 MED ORDER — FAMOTIDINE 20 MG PO TABS
ORAL_TABLET | ORAL | Status: AC
  Filled 2019-01-06: qty 1

## 2019-01-06 MED ORDER — FENTANYL CITRATE (PF) 100 MCG/2ML IJ SOLN
INTRAMUSCULAR | Status: DC | PRN
  Administered 2019-01-06: 100 ug via INTRAVENOUS

## 2019-01-06 MED ORDER — LACTATED RINGERS IV SOLN: INTRAVENOUS | Status: DC

## 2019-01-06 MED ORDER — SUGAMMADEX SODIUM 200 MG/2ML IV SOLN
INTRAVENOUS | Status: DC | PRN
  Administered 2019-01-06: 200 mg via INTRAVENOUS

## 2019-01-06 MED ORDER — ACETAMINOPHEN 10 MG/ML IV SOLN
INTRAVENOUS | Status: DC | PRN
  Administered 2019-01-06: 1000 mg via INTRAVENOUS

## 2019-01-06 MED ORDER — HYDROCODONE-ACETAMINOPHEN 5-325 MG PO TABS: 1.0000 | ORAL_TABLET | Freq: Four times a day (QID) | ORAL | 0 refills | Status: DC | PRN

## 2019-01-06 MED ORDER — ROCURONIUM BROMIDE 100 MG/10ML IV SOLN
INTRAVENOUS | Status: DC | PRN
  Administered 2019-01-06: 40 mg via INTRAVENOUS

## 2019-01-06 MED ORDER — LIDOCAINE HCL (CARDIAC) PF 100 MG/5ML IV SOSY
PREFILLED_SYRINGE | INTRAVENOUS | Status: DC | PRN
  Administered 2019-01-06: 100 mg via INTRAVENOUS

## 2019-01-06 MED ORDER — EPHEDRINE SULFATE 50 MG/ML IJ SOLN
INTRAMUSCULAR | Status: DC | PRN
  Administered 2019-01-06: 15 mg via INTRAVENOUS

## 2019-01-06 MED ORDER — BUPIVACAINE HCL 0.5 % IJ SOLN
INTRAMUSCULAR | Status: DC | PRN
  Administered 2019-01-06: 7 mL

## 2019-01-06 MED ORDER — LACTATED RINGERS IV SOLN
INTRAVENOUS | Status: DC
  Administered 2019-01-06: 13:00:00 via INTRAVENOUS

## 2019-01-06 MED ORDER — PROMETHAZINE HCL 25 MG/ML IJ SOLN: 6.2500 mg | INTRAMUSCULAR | Status: DC | PRN

## 2019-01-06 MED ORDER — ACETAMINOPHEN 160 MG/5ML PO SOLN
325.0000 mg | ORAL | Status: DC | PRN
  Filled 2019-01-06: qty 10.2

## 2019-01-06 MED ORDER — FENTANYL CITRATE (PF) 100 MCG/2ML IJ SOLN
INTRAMUSCULAR | Status: AC
  Filled 2019-01-06: qty 2

## 2019-01-06 MED ORDER — IBUPROFEN 600 MG PO TABS: 600.0000 mg | ORAL_TABLET | Freq: Four times a day (QID) | ORAL | 0 refills | Status: DC | PRN

## 2019-01-06 MED ORDER — MEPERIDINE HCL 50 MG/ML IJ SOLN: 6.2500 mg | INTRAMUSCULAR | Status: DC | PRN

## 2019-01-06 MED ORDER — SUCCINYLCHOLINE CHLORIDE 20 MG/ML IJ SOLN
INTRAMUSCULAR | Status: AC
  Filled 2019-01-06: qty 1

## 2019-01-06 MED ORDER — ACETAMINOPHEN 325 MG PO TABS: 325.0000 mg | ORAL_TABLET | ORAL | Status: DC | PRN

## 2019-01-06 MED ORDER — HYDROCODONE-ACETAMINOPHEN 7.5-325 MG PO TABS
1.0000 | ORAL_TABLET | Freq: Once | ORAL | Status: DC | PRN
  Filled 2019-01-06: qty 1

## 2019-01-06 MED ORDER — ROCURONIUM BROMIDE 50 MG/5ML IV SOLN
INTRAVENOUS | Status: AC
  Filled 2019-01-06: qty 1

## 2019-01-06 MED ORDER — MIDAZOLAM HCL 2 MG/2ML IJ SOLN
INTRAMUSCULAR | Status: DC | PRN
  Administered 2019-01-06: 2 mg via INTRAVENOUS

## 2019-01-06 MED ORDER — MIDAZOLAM HCL 2 MG/2ML IJ SOLN
INTRAMUSCULAR | Status: AC
  Filled 2019-01-06: qty 2

## 2019-01-06 MED ORDER — LACTATED RINGERS IV SOLN
INTRAVENOUS | Status: DC | PRN
  Administered 2019-01-06: 14:00:00 via INTRAVENOUS

## 2019-01-06 MED ORDER — ACETAMINOPHEN NICU IV SYRINGE 10 MG/ML
INTRAVENOUS | Status: AC
  Filled 2019-01-06: qty 1

## 2019-01-06 MED ORDER — DEXAMETHASONE SODIUM PHOSPHATE 10 MG/ML IJ SOLN
INTRAMUSCULAR | Status: DC | PRN
  Administered 2019-01-06: 10 mg via INTRAVENOUS

## 2019-01-06 MED ORDER — FENTANYL CITRATE (PF) 100 MCG/2ML IJ SOLN: 25.0000 ug | INTRAMUSCULAR | Status: DC | PRN

## 2019-01-06 MED ORDER — PROPOFOL 10 MG/ML IV BOLUS
INTRAVENOUS | Status: DC | PRN
  Administered 2019-01-06: 160 mg via INTRAVENOUS

## 2019-01-06 SURGICAL SUPPLY — 54 items
ANCHOR TIS RET SYS 235ML (MISCELLANEOUS) ×2 IMPLANT
BAG URINE DRAINAGE (UROLOGICAL SUPPLIES) ×3 IMPLANT
BLADE SURG SZ11 CARB STEEL (BLADE) ×3 IMPLANT
CANISTER SUCT 1200ML W/VALVE (MISCELLANEOUS) ×3 IMPLANT
CATH FOLEY 2WAY  5CC 16FR (CATHETERS) ×2
CATH URTH 16FR FL 2W BLN LF (CATHETERS) ×1 IMPLANT
CHLORAPREP W/TINT 26 (MISCELLANEOUS) ×3 IMPLANT
COVER WAND RF STERILE (DRAPES) ×3 IMPLANT
DERMABOND ADVANCED (GAUZE/BANDAGES/DRESSINGS) ×2
DERMABOND ADVANCED .7 DNX12 (GAUZE/BANDAGES/DRESSINGS) ×1 IMPLANT
DRAPE 3/4 80X56 (DRAPES) ×3 IMPLANT
DRAPE LEGGINS SURG 28X43 STRL (DRAPES) ×3 IMPLANT
DRAPE UNDER BUTTOCK W/FLU (DRAPES) ×3 IMPLANT
ELECT REM PT RETURN 9FT ADLT (ELECTROSURGICAL) ×3
ELECTRODE REM PT RTRN 9FT ADLT (ELECTROSURGICAL) ×1 IMPLANT
GLOVE BIO SURGEON STRL SZ7 (GLOVE) ×6 IMPLANT
GLOVE BIOGEL PI IND STRL 6.5 (GLOVE) IMPLANT
GLOVE BIOGEL PI IND STRL 7.5 (GLOVE) ×1 IMPLANT
GLOVE BIOGEL PI INDICATOR 6.5 (GLOVE) ×2
GLOVE BIOGEL PI INDICATOR 7.5 (GLOVE) ×2
GLOVE SURG SYN 6.5 ES PF (GLOVE) ×3 IMPLANT
GLOVE SURG SYN 6.5 PF PI (GLOVE) IMPLANT
GOWN STRL REUS W/ TWL LRG LVL3 (GOWN DISPOSABLE) ×2 IMPLANT
GOWN STRL REUS W/TWL LRG LVL3 (GOWN DISPOSABLE) ×4
GRASPER SUT TROCAR 14GX15 (MISCELLANEOUS) ×2 IMPLANT
IRRIGATION STRYKERFLOW (MISCELLANEOUS) IMPLANT
IRRIGATOR STRYKERFLOW (MISCELLANEOUS)
IV LACTATED RINGERS 1000ML (IV SOLUTION) IMPLANT
KIT PINK PAD W/HEAD ARE REST (MISCELLANEOUS) ×3
KIT PINK PAD W/HEAD ARM REST (MISCELLANEOUS) ×1 IMPLANT
KIT TURNOVER CYSTO (KITS) ×3 IMPLANT
LABEL OR SOLS (LABEL) ×3 IMPLANT
LIGASURE VESSEL 5MM BLUNT TIP (ELECTROSURGICAL) ×2 IMPLANT
MANIPULATOR UTERINE 4.5 ZUMI (MISCELLANEOUS) ×1 IMPLANT
NEEDLE HYPO 22GX1.5 SAFETY (NEEDLE) ×3 IMPLANT
NS IRRIG 500ML POUR BTL (IV SOLUTION) ×3 IMPLANT
PACK LAP CHOLECYSTECTOMY (MISCELLANEOUS) ×3 IMPLANT
PAD OB MATERNITY 4.3X12.25 (PERSONAL CARE ITEMS) ×3 IMPLANT
PAD PREP 24X41 OB/GYN DISP (PERSONAL CARE ITEMS) ×3 IMPLANT
SCISSORS METZENBAUM CVD 33 (INSTRUMENTS) IMPLANT
SET TUBE SMOKE EVAC HIGH FLOW (TUBING) ×3 IMPLANT
SHEARS HARMONIC ACE PLUS 36CM (ENDOMECHANICALS) IMPLANT
SLEEVE ENDOPATH XCEL 5M (ENDOMECHANICALS) ×5 IMPLANT
SOL PREP PVP 2OZ (MISCELLANEOUS) ×3
SOLUTION PREP PVP 2OZ (MISCELLANEOUS) ×1 IMPLANT
SURGILUBE 2OZ TUBE FLIPTOP (MISCELLANEOUS) ×3 IMPLANT
SUT MNCRL 4-0 (SUTURE)
SUT MNCRL 4-0 27XMFL (SUTURE)
SUT VIC AB 0 CT1 36 (SUTURE) ×4 IMPLANT
SUT VIC AB 2-0 UR6 27 (SUTURE) IMPLANT
SUTURE MNCRL 4-0 27XMF (SUTURE) IMPLANT
TROCAR ENDO BLADELESS 11MM (ENDOMECHANICALS) IMPLANT
TROCAR XCEL NON-BLD 5MMX100MML (ENDOMECHANICALS) ×3 IMPLANT
TROCAR XCEL UNIV SLVE 11M 100M (ENDOMECHANICALS) IMPLANT

## 2019-01-06 NOTE — Anesthesia Post-op Follow-up Note (Signed)
Anesthesia QCDR form completed.        

## 2019-01-06 NOTE — Transfer of Care (Signed)
Immediate Anesthesia Transfer of Care Note  Patient: Crystal Branch  Procedure(s) Performed: LAPAROSCOPIC BILATERAL SALPINGO OOPHORECTOMY (N/A )  Patient Location: PACU  Anesthesia Type:General  Level of Consciousness: sedated  Airway & Oxygen Therapy: Patient Spontanous Breathing and Patient connected to face mask oxygen  Post-op Assessment: Report given to RN and Post -op Vital signs reviewed and stable  Post vital signs: Reviewed and stable  Last Vitals:  Vitals Value Taken Time  BP    Temp    Pulse 71 01/06/19 1517  Resp    SpO2 100 % 01/06/19 1517  Vitals shown include unvalidated device data.  Last Pain:  Vitals:   01/06/19 1250  TempSrc: Temporal  PainSc: 0-No pain         Complications: No apparent anesthesia complications

## 2019-01-06 NOTE — Anesthesia Preprocedure Evaluation (Addendum)
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  Mammoprint: High risk.  No date: Dysrhythmia     Comment:  atrial tach/ 2013 No date: Personal history of chemotherapy No date: Personal history of radiation therapy Past Surgical History: 09/30/2017: BREAST BIOPSY; Right     Comment:  US guided biopsy, INVASIVE MAMMARY CARCINOMA WITH               MUCINOUS FEATURES ER/PR positive 10/26/2017: BREAST LUMPECTOMY; Right 10/26/2017: BREAST LUMPECTOMY WITH SENTINEL LYMPH NODE BIOPSY; Right     Comment:  Procedure: BREAST LUMPECTOMY WITH SENTINEL LYMPH NODE               BX;  Surgeon: Robert Bellow, MD;  Location: [facility]               ORS;  Service: General;  Laterality: Right; 12/01/2018: COLONOSCOPY WITH PROPOFOL; N/A     Comment:  Procedure: COLONOSCOPY WITH PROPOFOL;  Surgeon: Robert Bellow, MD;  Location: [facility] ENDOSCOPY;  Service:               Endoscopy;  Laterality: N/A; 2015: TONSILLECTOMY BMI    Body Mass Index: 25.58 kg/m     Reproductive/Obstetrics                             Anesthesia Physical Anesthesia Plan  ASA: II  Anesthesia Plan: General   Post-op Pain Management:    Induction: Intravenous  PONV Risk Score and Plan: 4 or greater  and Midazolam, Ondansetron, Dexamethasone, Metaclopromide and Treatment may vary due to age or medical condition  Airway Management Planned: Oral ETT  Additional Equipment:   Intra-op Plan:   Post-operative Plan: Extubation in OR  Informed Consent: I have reviewed the patients History and Physical, chart, labs and discussed the procedure including the risks, benefits and alternatives for the proposed anesthesia with the patient or authorized representative who has indicated his/her understanding and acceptance.     Dental Advisory Given  Plan Discussed with: CRNA  Anesthesia Plan Comments:         Anesthesia Quick Evaluation

## 2019-01-06 NOTE — Discharge Instructions (Signed)

## 2019-01-06 NOTE — Interval H&P Note (Signed)
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  The various methods of treatment have been discussed with the patient and family. After consideration of risks, benefits and other options for treatment, the patient has consented to  Procedure(s): LAPAROSCOPIC BILATERAL SALPINGO OOPHORECTOMY (N/A) as a surgical intervention.  The patient's history has been reviewed, patient examined, no change in status, stable for surgery.  I have reviewed the patient's chart and labs.  Questions were answered to the patient's satisfaction.    Prentice Docker, MD, [facility], [facility] 01/06/2019 12:51 PM

## 2019-01-06 NOTE — Op Note (Signed)
Operative Note    Pre-Operative Diagnosis: History of breast cancer, estrogen receptor positive  Post-Operative Diagnosis: History of breast cancer, estrogen receptor positive  Procedures: Laparoscopic bilateral salpingo-oophorectomy  Primary Surgeon: Prentice Docker, MD   Assistant Surgeon: Adrian Prows, MD - No other capable assistant available, in surgery requiring high level assistant.  EBL: 10 mL   IVF: 1,000 mL   Urine output: 300 mL clear urine at end of case  Specimens:  1) left fallopian tube and ovary 2) right fallopian tube and ovary  Drains: none  Complications: None   Disposition: PACU   Condition: Stable   Findings:  1. Normal appearing uterus and normal bilateral fallopian tubes and ovaries 2. Filmy adhesions of sigmoid bowel to left pelvic sidewall  Procedure Summary:  The patient was taken to the operating room where general anesthesia was administered and found to be adequate. She was placed in the dorsal supine lithotomy position in [place] stirrups and prepped and draped in usual sterile fashion. After a timeout was called an indwelling catheter was placed in her bladder. A sponge-on-a-stick was placed for uterine manipulation.    Attention was turned to the abdomen where after injection of local anesthetic, a 5 mm infraumbilical incision was made with the scalpel. Entry into the abdomen was obtained via Optiview trocar technique (a blunt entry technique with camera visualization through the obturator upon entry). Verification of entry into the abdomen was obtained using opening pressures. The abdomen was insufflated with CO2. The camera was introduced through the trocar with verification of atraumatic entry.  A left lower quadrant 5 mm port site was created under direct intra-abdominal camera visualization without difficulty.  The same procedure was carried out on the right lower quadrant.  A survey of the pelvis was undertaken with the above-noted  findings.  The sigmoid: Adhesions to the left pelvic sidewall were taken down using bipolar electrocautery without difficulty.  This exposed the left fallopian tube and ovary, as well as the infundibulopelvic ligament.  The left ureter was identified and found to be well away from the operative area of interest.  The left infundibulopelvic ligament was cauterized and transected using the LigaSure device.  The mesosalpinx was dissected medially.  The utero-ovarian ligament was cauterized and transected with the inclusion of the proximal left fallopian tube.  The specimen was placed in the posterior cul-de-sac.  With exception of having to perform lysis of adhesions, the same procedure was carried out on the right side.  The ureter was easily identified well away from the operative area of interest.  The specimen was removed from its vascular pedicles and attachments and placed in the anterior cul-de-sac.  The umbilical 5 mm port site was exchanged for a 12 mm trocar.  An Endo Catch bag was introduced through this trocar and the specimens were removed separately and labeled accordingly.  The intra-abdominal pressure was lowered to 5 mmHg and the vascular pedicles were noted to be hemostatic.  The umbilical fascia was reapproximated using 0 Vicryl.  CO2 was expelled from the abdomen with the help of 5 deep breaths from anesthesia.  The remaining trochars were removed without difficulty.  All skin sites were closed using 4-0 Monocryl in a subcuticular fashion.  Surgical skin glue was applied at all skin incision sites, as well.  Attention was turned to the pelvis where the sponge stick was removed and the indwelling catheter was removed without difficulty.  The vagina was swept to ensure no remaining instruments or sponges were  present.  The patient tolerated the procedure well.  Sponge, lap, needle, and instrument counts were correct x 2.  VTE prophylaxis: SCDs. Antibiotic prophylaxis: none indicated, none given.  She was awakened in the operating room and was taken to the PACU in stable condition. The assistant surgeon was an MD due to lack of availability of another Counselling psychologist.   Prentice Docker, MD 01/06/2019 3:05 PM

## 2019-01-06 NOTE — Anesthesia Procedure Notes (Signed)
Procedure Name: Intubation Date/Time: 01/06/2019 1:48 PM Performed by: Justus Memory, CRNA Pre-anesthesia Checklist: Patient identified, Patient being monitored, Timeout performed, Emergency Drugs available and Suction available Patient Re-evaluated:Patient Re-evaluated prior to induction Oxygen Delivery Method: Circle system utilized Preoxygenation: Pre-oxygenation with 100% oxygen Induction Type: IV induction Ventilation: Mask ventilation without difficulty Laryngoscope Size: Mac and 3 Grade View: Grade I Tube type: Oral Tube size: 7.0 mm Number of attempts: 1 Airway Equipment and Method: Stylet Placement Confirmation: ETT inserted through vocal cords under direct vision,  positive ETCO2 and breath sounds checked- equal and bilateral Secured at: 21 cm Tube secured with: Tape Dental Injury: Teeth and Oropharynx as per pre-operative assessment

## 2019-01-07 ENCOUNTER — Telehealth: Payer: Self-pay

## 2019-01-07 NOTE — Telephone Encounter (Signed)
Post op call today. Pt states she is feeling well and as expected. No issues at this time. Has PO appt 9/4

## 2019-01-10 LAB — SURGICAL PATHOLOGY

## 2019-01-13 ENCOUNTER — Other Ambulatory Visit: Payer: Self-pay | Admitting: Internal Medicine

## 2019-01-13 NOTE — Anesthesia Postprocedure Evaluation (Signed)
Anesthesia Post Note  Patient: Crystal Branch  Procedure(s) Performed: LAPAROSCOPIC BILATERAL SALPINGO OOPHORECTOMY (N/A )  Patient location during evaluation: PACU Anesthesia Type: General Level of consciousness: awake and alert Pain management: pain level controlled Vital Signs Assessment: post-procedure vital signs reviewed and stable Respiratory status: spontaneous breathing, nonlabored ventilation, respiratory function stable and patient connected to nasal cannula oxygen Cardiovascular status: blood pressure returned to baseline and stable Postop Assessment: no apparent nausea or vomiting Anesthetic complications: no     Last Vitals:  Vitals:   01/06/19 1615 01/06/19 1634  BP: (!) 118/51 (!) 111/53  Pulse: (!) 54 (!) 55  Resp: 20 18  Temp: (!) 36.2 C   SpO2: 100% 100%    Last Pain:  Vitals:   01/07/19 0911  TempSrc:   PainSc: 3                  [name]

## 2019-01-21 ENCOUNTER — Other Ambulatory Visit: Payer: Self-pay

## 2019-01-21 ENCOUNTER — Ambulatory Visit (INDEPENDENT_AMBULATORY_CARE_PROVIDER_SITE_OTHER): Payer: Managed Care, Other (non HMO) | Admitting: Obstetrics and Gynecology

## 2019-01-21 ENCOUNTER — Encounter: Payer: Self-pay | Admitting: Obstetrics and Gynecology

## 2019-01-21 VITALS — BP 100/60 | Ht 64.0 in | Wt 148.0 lb

## 2019-01-21 DIAGNOSIS — Z09 Encounter for follow-up examination after completed treatment for conditions other than malignant neoplasm: Secondary | ICD-10-CM

## 2019-01-21 DIAGNOSIS — Z9889 Other specified postprocedural states: Secondary | ICD-10-CM

## 2019-01-21 NOTE — Progress Notes (Signed)
   Postoperative Follow-up Patient presents post op from laparoscopic bilateral salpingo-oophorectomy 2 weeks ago for history of estrogen-receptor positive breast cancer.  Subjective: Eating a regular diet without difficulty. The patient is not having any pain.  Activity: increasing slowly. Patient denies fevers, chills, nausea, vomiting.  She has normal bowel and bladder habits.   Pathology was benign and reviewed with the patient.   Objective: Vitals:   01/21/19 1339  BP: 100/60   Vital Signs: BP 100/60   Ht 5\' 4"  (1.626 m)   Wt 148 lb (67.1 kg)   BMI 25.40 kg/m  Constitutional: Well nourished, well developed female in no acute distress.  HEENT: normal Skin: Warm and dry.  Extremity: no edema  Abdomen: Soft, non-tender, normal bowel sounds; no bruits, organomegaly or masses. clean, dry and intact  Pelvic exam: deferred   Assessment: 48 y.o. s/p laparoscopic BSO progressing well  Plan: Patient has done well after surgery with no apparent complications.  I have discussed the post-operative course to date, and the expected progress moving forward.  The patient understands what complications to be concerned about.  I will see the patient in routine follow up, or sooner if needed.    Activity plan: no heavy lifting until 6 weeks postop.  No further specialized follow up recommended.  Routine gynecologic care still recommended.  Prentice Docker, MD 01/21/2019, 1:44 PM

## 2019-01-25 ENCOUNTER — Inpatient Hospital Stay: Payer: Managed Care, Other (non HMO) | Attending: Internal Medicine

## 2019-01-25 ENCOUNTER — Other Ambulatory Visit: Payer: Self-pay

## 2019-01-25 ENCOUNTER — Inpatient Hospital Stay (HOSPITAL_BASED_OUTPATIENT_CLINIC_OR_DEPARTMENT_OTHER): Payer: Managed Care, Other (non HMO) | Admitting: Internal Medicine

## 2019-01-25 ENCOUNTER — Inpatient Hospital Stay: Payer: Managed Care, Other (non HMO)

## 2019-01-25 ENCOUNTER — Encounter: Payer: Self-pay | Admitting: Internal Medicine

## 2019-01-25 DIAGNOSIS — C50411 Malignant neoplasm of upper-outer quadrant of right female breast: Secondary | ICD-10-CM

## 2019-01-25 DIAGNOSIS — Z79899 Other long term (current) drug therapy: Secondary | ICD-10-CM | POA: Insufficient documentation

## 2019-01-25 DIAGNOSIS — Z9221 Personal history of antineoplastic chemotherapy: Secondary | ICD-10-CM | POA: Diagnosis not present

## 2019-01-25 DIAGNOSIS — G629 Polyneuropathy, unspecified: Secondary | ICD-10-CM | POA: Insufficient documentation

## 2019-01-25 DIAGNOSIS — Z803 Family history of malignant neoplasm of breast: Secondary | ICD-10-CM | POA: Diagnosis not present

## 2019-01-25 DIAGNOSIS — Z90722 Acquired absence of ovaries, bilateral: Secondary | ICD-10-CM | POA: Diagnosis not present

## 2019-01-25 DIAGNOSIS — N951 Menopausal and female climacteric states: Secondary | ICD-10-CM | POA: Diagnosis not present

## 2019-01-25 DIAGNOSIS — Z170 Estrogen receptor positive status [ER+]: Secondary | ICD-10-CM | POA: Diagnosis not present

## 2019-01-25 DIAGNOSIS — Z800 Family history of malignant neoplasm of digestive organs: Secondary | ICD-10-CM | POA: Insufficient documentation

## 2019-01-25 DIAGNOSIS — Z923 Personal history of irradiation: Secondary | ICD-10-CM | POA: Insufficient documentation

## 2019-01-25 DIAGNOSIS — Z791 Long term (current) use of non-steroidal anti-inflammatories (NSAID): Secondary | ICD-10-CM | POA: Insufficient documentation

## 2019-01-25 DIAGNOSIS — Z79810 Long term (current) use of selective estrogen receptor modulators (SERMs): Secondary | ICD-10-CM | POA: Diagnosis not present

## 2019-01-25 LAB — CBC WITH DIFFERENTIAL/PLATELET
Abs Immature Granulocytes: 0 10*3/uL (ref 0.00–0.07)
Basophils Absolute: 0 10*3/uL (ref 0.0–0.1)
Basophils Relative: 1 %
Eosinophils Absolute: 0.1 10*3/uL (ref 0.0–0.5)
Eosinophils Relative: 2 %
HCT: 35.9 % — ABNORMAL LOW (ref 36.0–46.0)
Hemoglobin: 11.9 g/dL — ABNORMAL LOW (ref 12.0–15.0)
Immature Granulocytes: 0 %
Lymphocytes Relative: 34 %
Lymphs Abs: 1.1 10*3/uL (ref 0.7–4.0)
MCH: 33.8 pg (ref 26.0–34.0)
MCHC: 33.1 g/dL (ref 30.0–36.0)
MCV: 102 fL — ABNORMAL HIGH (ref 80.0–100.0)
Monocytes Absolute: 0.3 10*3/uL (ref 0.1–1.0)
Monocytes Relative: 9 %
Neutro Abs: 1.9 10*3/uL (ref 1.7–7.7)
Neutrophils Relative %: 54 %
Platelets: 184 10*3/uL (ref 150–400)
RBC: 3.52 MIL/uL — ABNORMAL LOW (ref 3.87–5.11)
RDW: 11.7 % (ref 11.5–15.5)
WBC: 3.4 10*3/uL — ABNORMAL LOW (ref 4.0–10.5)
nRBC: 0 % (ref 0.0–0.2)

## 2019-01-25 LAB — BASIC METABOLIC PANEL
Anion gap: 6 (ref 5–15)
BUN: 17 mg/dL (ref 6–20)
CO2: 30 mmol/L (ref 22–32)
Calcium: 9 mg/dL (ref 8.9–10.3)
Chloride: 107 mmol/L (ref 98–111)
Creatinine, Ser: 0.64 mg/dL (ref 0.44–1.00)
GFR calc Af Amer: >60 mL/min (ref >60)
GFR calc non Af Amer: >60 mL/min (ref >60)
Glucose, Bld: 93 mg/dL (ref 70–99)
Potassium: 4.2 mmol/L (ref 3.5–5.1)
Sodium: 143 mmol/L (ref 135–145)

## 2019-01-25 MED ORDER — SODIUM CHLORIDE 0.9 % IV SOLN
Freq: Once | INTRAVENOUS | Status: AC
  Administered 2019-01-25: 14:00:00 via INTRAVENOUS
  Filled 2019-01-25: qty 250

## 2019-01-25 MED ORDER — ERGOCALCIFEROL 1.25 MG (50000 UT) PO CAPS: 50000.0000 [IU] | ORAL_CAPSULE | ORAL | 1 refills | Status: DC

## 2019-01-25 MED ORDER — ZOLEDRONIC ACID 4 MG/100ML IV SOLN
4.0000 mg | Freq: Once | INTRAVENOUS | Status: AC
  Administered 2019-01-25: 4 mg via INTRAVENOUS
  Filled 2019-01-25: qty 100

## 2019-01-25 MED ORDER — TRIPTORELIN PAMOATE 3.75 MG IM SUSR: 11.2500 mg | Freq: Once | INTRAMUSCULAR | Status: DC

## 2019-01-25 NOTE — Assessment & Plan Note (Addendum)
#  Stage I ER PR positive HER-2 negative; HIGH risk mamma print.   On adjuvant Tamoxifen + OFS/ trelstar.   #Patient tolerating treatment with moderate side effects [see joint pains below].  HOLD TAMOXIFEN x 2 months.  Patient currently status post BSO; discontinue Trelstar.  # Continue adjuvant Zometa every 6 months; due today.  Calcium normal.  # right hand/joint pains- G-1-2; patient already taking Osteo Bi-Flex.  Worse; HOLD TAMOXIFEN; also recommend high-dose vitamin D 50,000 units q. weekly.  If worse- will get X-ray. Trial of advil 1-2/day. If not improved recommend ortho appt.   # Hot flashes-grade 1; STABLE.   # Peripheral neuropathy- G-1;STABLE  # DISPOSITION: # Zometa ONLY today; NO triptorelin.  # follow up in 2 months- NO labs;  -Dr.B

## 2019-01-25 NOTE — Progress Notes (Signed)
[place] OFFICE PROGRESS NOTE  Patient Care Team: Cletis Athens, MD as PCP - General (Internal Medicine) Bary Castilla, Forest Gleason, MD as Surgeon (General Surgery) Rico Junker, RN as Oncology Nurse Navigator Noreene Filbert, MD as Referring Physician (Radiation Oncology) Cammie Sickle, MD as Consulting Physician (Internal Medicine) Lequita Asal, MD as Referring Physician (Hematology and Oncology)  Cancer Staging No matching staging information was found for the patient.   Oncology History Overview Note  # May 2019- RIGHT BREAST CA s/p Lumpec & SLNBx [pT1c pN0 (sn); G-1;  margins clear; ER/PR > 90%; Her 2 neu- FISH-NEG. MAMMAPRINT [Dr.Byrnett]- HIGH RISK  # July 29th TC x4 [finished 02/15/2018] s/p RT [dec 11th 2019]; # jan 13th 2020- start Tam  # March 9th 2020- Triptorelinq 4q; starting June 2020- switch to q12w; August 2020-BSO; discontinue triptorelin.   # march 9th 2020- Zometa 4 mg/ adjuvant  GENETIC TESTING: [Dr.Byrnett] NEGATIVE FOR - ATM/ BRCA2/CHEK2/PTEN/TP53/BRCA1/CDH1/PALB2/ STK11   DIAGNOSIS: BREAST CA   STAGE:  I/mammaprint-H ;GOALS: cure  CURRENT/MOST RECENT THERAPY: Tam + TBSO    Malignant neoplasm of upper-outer quadrant of right breast in female, estrogen receptor positive ([place])  11/03/2017 Initial Diagnosis   Malignant neoplasm of upper-outer quadrant of right breast in female, estrogen receptor positive ([place])       INTERVAL HISTORY:  Crystal Branch 48 y.o.  female pleasant patient above history of stage I breast cancer ER PR positive HER-2/neu negative high risk mamma print status post chemotherapy currently on tamoxifen is here for follow-up.  In the interim patient underwent bilateral salpingo-oophorectomy.  She has mild hot flashes.  She complains of worsening pain in the right lateral wrist.  Worse with movement like turning the keys.  Ice pack has not helped.  She is taking Tylenol 1 or 2 a day without any significant  help.  Review of Systems  Constitutional: Positive for malaise/fatigue. Negative for chills, diaphoresis, fever and weight loss.  HENT: Negative for nosebleeds and sore throat.   Eyes: Negative for double vision.  Respiratory: Negative for cough, hemoptysis, sputum production, shortness of breath and wheezing.   Cardiovascular: Negative for chest pain, palpitations, orthopnea and leg swelling.  Gastrointestinal: Negative for abdominal pain, blood in stool, constipation, heartburn, melena, nausea and vomiting.  Genitourinary: Negative for dysuria, frequency and urgency.  Musculoskeletal: Positive for joint pain. Negative for back pain.  Skin: Negative for itching.  Neurological: Positive for tingling. Negative for dizziness, focal weakness, weakness and headaches.  Endo/Heme/Allergies: Does not bruise/bleed easily.  Psychiatric/Behavioral: Negative for depression. The patient is not nervous/anxious and does not have insomnia.       PAST MEDICAL HISTORY :  Past Medical History:  Diagnosis Date  . BRCA negative 09/2016   [facility]  . 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  Mammoprint: High risk.   Marland Kitchen Dysrhythmia    atrial tach/ 2013  . Personal history of chemotherapy   . Personal history of radiation therapy     PAST SURGICAL HISTORY :   Past Surgical History:  Procedure Laterality Date  . 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 BREAST LUMPECTOMY Right 10/26/2017  . BREAST LUMPECTOMY WITH SENTINEL LYMPH NODE BIOPSY Right 10/26/2017   Procedure: BREAST LUMPECTOMY WITH SENTINEL LYMPH NODE BX;  Surgeon: Robert Bellow, MD;  Location: [facility] ORS;  Service: General;  Laterality:  Right;  Marland Kitchen COLONOSCOPY WITH PROPOFOL N/A 12/01/2018   Procedure: COLONOSCOPY WITH PROPOFOL;  Surgeon: Robert Bellow, MD;  Location: [facility] ENDOSCOPY;  Service:  Endoscopy;  Laterality: N/A;  . LAPAROSCOPIC BILATERAL SALPINGO OOPHERECTOMY N/A 01/06/2019   Procedure: LAPAROSCOPIC BILATERAL SALPINGO OOPHORECTOMY;  Surgeon: Will Bonnet, MD;  Location: [facility] ORS;  Service: Gynecology;  Laterality: N/A;  . TONSILLECTOMY  2015    FAMILY HISTORY :   Family History  Problem Relation Age of Onset  . Breast cancer Maternal Aunt 60  . Breast cancer Maternal Grandmother 37  . Colon cancer Mother 47    SOCIAL HISTORY:   Social History   Tobacco Use  . Smoking status: Never Smoker  . Smokeless tobacco: Never Used  Substance Use Topics  . Alcohol use: Never    Frequency: Never  . Drug use: Never    ALLERGIES:  has No Known Allergies.  MEDICATIONS:  Current Outpatient Medications  Medication Sig Dispense Refill  . Calcium Carb-Cholecalciferol (CALCIUM-VITAMIN D3) 600-500 MG-UNIT CAPS Take 1 capsule by mouth 2 (two) times daily.    . fluticasone (FLONASE) 50 MCG/ACT nasal spray Place 1 spray into both nostrils daily as needed for allergies or rhinitis.    . hydrocortisone cream 1 % Apply 1 application topically daily as needed for itching.    Marland Kitchen ibuprofen (ADVIL) 600 MG tablet Take 1 tablet (600 mg total) by mouth every 6 (six) hours as needed for mild pain or cramping. 30 tablet 0  . loratadine (CLARITIN) 10 MG tablet Take 1 tablet (10 mg total) by mouth daily. (Patient taking differently: Take 10 mg by mouth daily as needed for allergies. )    . Misc Natural Products (OSTEO BI-FLEX JOINT SHIELD PO) Take 1 capsule by mouth 2 (two) times daily.     . Multiple Vitamin (MULTIVITAMIN WITH MINERALS) TABS tablet Take 1 tablet by mouth daily.    . Probiotic Product (Lluveras) CAPS Take 1 capsule by mouth every evening.     . tamoxifen (NOLVADEX) 20 MG tablet TAKE 1 TABLET BY MOUTH  DAILY 90 tablet 3  . triptorelin (TRELESTAR LA) 11.25 MG injection Inject 11.25 mg into the muscle every 3 (three) months.    . Zoledronic Acid (ZOMETA) 4  MG/100ML IVPB Inject 4 mg into the vein every 6 (six) months.    . ergocalciferol (VITAMIN D2) 1.25 MG (50000 UT) capsule Take 1 capsule (50,000 Units total) by mouth once a week. 12 capsule 1   No current facility-administered medications for this visit.     PHYSICAL EXAMINATION: ECOG PERFORMANCE STATUS: 0 - Asymptomatic  BP 98/61 (BP Location: Left Arm, Patient Position: Sitting, Cuff Size: Normal)   Pulse (!) 56   Temp 99.2 F (37.3 C) (Tympanic)   Resp 16   Wt 149 lb (67.6 kg)   BMI 25.58 kg/m   Filed Weights   01/25/19 1325  Weight: 149 lb (67.6 kg)   Physical Exam  Constitutional: She is oriented to person, place, and time and well-developed, well-nourished, and in no distress.  Alone.  Walking by self.  HENT:  Head: Normocephalic and atraumatic.  Mouth/Throat: Oropharynx is clear and moist. No oropharyngeal exudate.  Eyes: Pupils are equal, round, and reactive to light.  Neck: Normal range of motion. Neck supple.  Cardiovascular: Normal rate and regular rhythm.  Pulmonary/Chest: No respiratory distress. She has no wheezes.  Abdominal: Soft. Bowel sounds are normal. She exhibits no distension and no mass. There is no abdominal  tenderness. There is no rebound and no guarding.  Musculoskeletal: Normal range of motion.        General: No tenderness or edema.  Neurological: She is alert and oriented to person, place, and time.  Skin: Skin is warm.  Psychiatric: Affect normal.      LABORATORY DATA:  I have reviewed the data as listed    Component Value Date/Time   NA 143 01/25/2019 1227   NA 143 10/13/2017 0920   NA 142 06/19/2011 2227   K 4.2 01/25/2019 1227   K 3.8 06/19/2011 2227   CL 107 01/25/2019 1227   CL 107 06/19/2011 2227   CO2 30 01/25/2019 1227   CO2 21 06/19/2011 2227   GLUCOSE 93 01/25/2019 1227   GLUCOSE 103 (H) 06/19/2011 2227   BUN 17 01/25/2019 1227   BUN 13 10/13/2017 0920   BUN 10 06/19/2011 2227   CREATININE 0.64 01/25/2019 1227    CREATININE 0.75 06/19/2011 2227   CALCIUM 9.0 01/25/2019 1227   CALCIUM 9.5 06/19/2011 2227   PROT 6.9 01/03/2019 0936   PROT 6.5 10/13/2017 0920   ALBUMIN 4.6 01/03/2019 0936   ALBUMIN 4.6 10/13/2017 0920   AST 26 01/03/2019 0936   ALT 21 01/03/2019 0936   ALKPHOS 33 (L) 01/03/2019 0936   BILITOT 0.7 01/03/2019 0936   BILITOT 0.4 10/13/2017 0920   GFRNONAA >60 01/25/2019 1227   GFRNONAA >60 06/19/2011 2227   GFRAA >60 01/25/2019 1227   GFRAA >60 06/19/2011 2227    No results found for: SPEP, UPEP  Lab Results  Component Value Date   WBC 3.4 (L) 01/25/2019   NEUTROABS 1.9 01/25/2019   HGB 11.9 (L) 01/25/2019   HCT 35.9 (L) 01/25/2019   MCV 102.0 (H) 01/25/2019   PLT 184 01/25/2019      Chemistry      Component Value Date/Time   NA 143 01/25/2019 1227   NA 143 10/13/2017 0920   NA 142 06/19/2011 2227   K 4.2 01/25/2019 1227   K 3.8 06/19/2011 2227   CL 107 01/25/2019 1227   CL 107 06/19/2011 2227   CO2 30 01/25/2019 1227   CO2 21 06/19/2011 2227   BUN 17 01/25/2019 1227   BUN 13 10/13/2017 0920   BUN 10 06/19/2011 2227   CREATININE 0.64 01/25/2019 1227   CREATININE 0.75 06/19/2011 2227      Component Value Date/Time   CALCIUM 9.0 01/25/2019 1227   CALCIUM 9.5 06/19/2011 2227   ALKPHOS 33 (L) 01/03/2019 0936   AST 26 01/03/2019 0936   ALT 21 01/03/2019 0936   BILITOT 0.7 01/03/2019 0936   BILITOT 0.4 10/13/2017 0920       RADIOGRAPHIC STUDIES: I have personally reviewed the radiological images as listed and agreed with the findings in the report. No results found.   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   On adjuvant Tamoxifen + OFS/ trelstar.   #Patient tolerating treatment with moderate side effects [see joint pains below].  HOLD TAMOXIFEN x 2 months.  Patient currently status post BSO; discontinue  Trelstar.  # Continue adjuvant Zometa every 6 months; due today.  Calcium normal.  # right hand/joint pains- G-1-2; patient already taking Osteo Bi-Flex.  Worse; HOLD TAMOXIFEN; also recommend high-dose vitamin D 50,000 units q. weekly.  If worse- will get X-ray. Trial of advil 1-2/day. If not  improved recommend ortho appt.   # Hot flashes-grade 1; STABLE.   # Peripheral neuropathy- G-1;STABLE  # DISPOSITION: # Zometa ONLY today; NO triptorelin.  # follow up in 2 months- NO labs;  -Dr.B   No orders of the defined types were placed in this encounter.  All questions were answered. The patient knows to call the clinic with any problems, questions or concerns.      Cammie Sickle, MD 01/25/2019 1:48 PM

## 2019-03-29 ENCOUNTER — Inpatient Hospital Stay: Payer: Managed Care, Other (non HMO) | Attending: Internal Medicine | Admitting: Internal Medicine

## 2019-03-29 ENCOUNTER — Other Ambulatory Visit: Payer: Self-pay

## 2019-03-29 VITALS — BP 104/49 | HR 64 | Temp 98.0°F | Wt 150.4 lb

## 2019-03-29 DIAGNOSIS — Z79811 Long term (current) use of aromatase inhibitors: Secondary | ICD-10-CM | POA: Diagnosis not present

## 2019-03-29 DIAGNOSIS — C50411 Malignant neoplasm of upper-outer quadrant of right female breast: Secondary | ICD-10-CM | POA: Insufficient documentation

## 2019-03-29 DIAGNOSIS — Z90722 Acquired absence of ovaries, bilateral: Secondary | ICD-10-CM | POA: Diagnosis not present

## 2019-03-29 DIAGNOSIS — M2550 Pain in unspecified joint: Secondary | ICD-10-CM

## 2019-03-29 DIAGNOSIS — Z170 Estrogen receptor positive status [ER+]: Secondary | ICD-10-CM | POA: Diagnosis not present

## 2019-03-29 DIAGNOSIS — N951 Menopausal and female climacteric states: Secondary | ICD-10-CM | POA: Insufficient documentation

## 2019-03-29 DIAGNOSIS — G629 Polyneuropathy, unspecified: Secondary | ICD-10-CM | POA: Insufficient documentation

## 2019-03-29 MED ORDER — ANASTROZOLE 1 MG PO TABS: 1.0000 mg | ORAL_TABLET | Freq: Every day | ORAL | 5 refills | Status: DC

## 2019-03-29 NOTE — Progress Notes (Signed)
[facility] OFFICE PROGRESS NOTE  Patient Care Team: Cletis Athens, MD as PCP - General (Internal Medicine) Bary Castilla, Forest Gleason, MD as Surgeon (General Surgery) Rico Junker, RN as Oncology Nurse Navigator Noreene Filbert, MD as Referring Physician (Radiation Oncology) Cammie Sickle, MD as Consulting Physician (Internal Medicine) Lequita Asal, MD as Referring Physician (Hematology and Oncology)  Cancer Staging No matching staging information was found for the patient.   Oncology History Overview Note  # May 2019- RIGHT BREAST CA s/p Lumpec & SLNBx [pT1c pN0 (sn); G-1;  margins clear; ER/PR > 90%; Her 2 neu- FISH-NEG. MAMMAPRINT [Dr.Byrnett]- HIGH RISK  # July 29th TC x4 [finished 02/15/2018] s/p RT [dec 11th 2019]; # jan 13th 2020- start Tam; STOPPED in SEP 2020- sec to tolerance.  #Nov 2020-Arimidex [s/p BSO]  # March 9th 2020- Triptorelinq 4q; starting June 2020- switch to q12w; August 2020-BSO; discontinue triptorelin.   # march 9th 2020- Zometa 4 mg/ adjuvant   GENETIC TESTING: [Dr.Byrnett] NEGATIVE FOR - ATM/ BRCA2/CHEK2/PTEN/TP53/BRCA1/CDH1/PALB2/ STK11   DIAGNOSIS: BREAST CA   STAGE:  I/mammaprint-H ;GOALS: cure  CURRENT/MOST RECENT THERAPY: Tam + TBSO    Malignant neoplasm of upper-outer quadrant of right breast in female, estrogen receptor positive ([place])  11/03/2017 Initial Diagnosis   Malignant neoplasm of upper-outer quadrant of right breast in female, estrogen receptor positive ([place])       INTERVAL HISTORY:  [place] 48 y.o.  female pleasant patient above history of stage I breast cancer ER PR positive HER-2/neu negative high risk mamma print status post chemotherapy-is here for follow-up.  Tamoxifen was discontinued last visit approximately 2 months ago because of poor tolerance/joint pains in the hand.  Patient has not noted significant improvement of her joint pains especially hands.  She has difficulty opening bottles  etc.  She is taking NSAIDs without any significant treatment.  Chronic mild fatigue.  Chronic tingling and numbness in extremities.  Not any worse.   Review of Systems  Constitutional: Positive for malaise/fatigue. Negative for chills, diaphoresis, fever and weight loss.  HENT: Negative for nosebleeds and sore throat.   Eyes: Negative for double vision.  Respiratory: Negative for cough, hemoptysis, sputum production, shortness of breath and wheezing.   Cardiovascular: Negative for chest pain, palpitations, orthopnea and leg swelling.  Gastrointestinal: Negative for abdominal pain, blood in stool, constipation, heartburn, melena, nausea and vomiting.  Genitourinary: Negative for dysuria, frequency and urgency.  Musculoskeletal: Positive for joint pain. Negative for back pain.  Skin: Negative for itching.  Neurological: Positive for tingling. Negative for dizziness, focal weakness, weakness and headaches.  Endo/Heme/Allergies: Does not bruise/bleed easily.  Psychiatric/Behavioral: Negative for depression. The patient is not nervous/anxious and does not have insomnia.       PAST MEDICAL HISTORY :  Past Medical History:  Diagnosis Date  . BRCA negative 09/2016   [facility]  . 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  Mammoprint: High risk.   Marland Kitchen Dysrhythmia    atrial tach/ 2013  . Personal history of chemotherapy   . Personal history of radiation therapy     PAST SURGICAL HISTORY :   Past Surgical History:  Procedure Laterality Date  . 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 BREAST LUMPECTOMY Right 10/26/2017  . BREAST LUMPECTOMY WITH SENTINEL LYMPH NODE BIOPSY Right 10/26/2017   Procedure: BREAST LUMPECTOMY WITH  SENTINEL LYMPH NODE BX;  Surgeon: Robert Bellow, MD;  Location: [facility] ORS;  Service: General;  Laterality: Right;  .  COLONOSCOPY WITH PROPOFOL N/A 12/01/2018   Procedure: COLONOSCOPY WITH PROPOFOL;  Surgeon: Robert Bellow, MD;  Location: [facility] ENDOSCOPY;  Service: Endoscopy;  Laterality: N/A;  . LAPAROSCOPIC BILATERAL SALPINGO OOPHERECTOMY N/A 01/06/2019   Procedure: LAPAROSCOPIC BILATERAL SALPINGO OOPHORECTOMY;  Surgeon: Will Bonnet, MD;  Location: [facility] ORS;  Service: Gynecology;  Laterality: N/A;  . TONSILLECTOMY  2015    FAMILY HISTORY :   Family History  Problem Relation Age of Onset  . Breast cancer Maternal Aunt 60  . Breast cancer Maternal Grandmother 89  . Colon cancer Mother 54    SOCIAL HISTORY:   Social History   Tobacco Use  . Smoking status: Never Smoker  . Smokeless tobacco: Never Used  Substance Use Topics  . Alcohol use: Never    Frequency: Never  . Drug use: Never    ALLERGIES:  has No Known Allergies.  MEDICATIONS:  Current Outpatient Medications  Medication Sig Dispense Refill  . Calcium Carb-Cholecalciferol (CALCIUM-VITAMIN D3) 600-500 MG-UNIT CAPS Take 1 capsule by mouth 2 (two) times daily.    . ergocalciferol (VITAMIN D2) 1.25 MG (50000 UT) capsule Take 1 capsule (50,000 Units total) by mouth once a week. 12 capsule 1  . ibuprofen (ADVIL) 600 MG tablet Take 1 tablet (600 mg total) by mouth every 6 (six) hours as needed for mild pain or cramping. 30 tablet 0  . Multiple Vitamin (MULTIVITAMIN WITH MINERALS) TABS tablet Take 1 tablet by mouth daily.    . Probiotic Product ([place]) CAPS Take 1 capsule by mouth every evening.     . Zoledronic Acid (ZOMETA) 4 MG/100ML IVPB Inject 4 mg into the vein every 6 (six) months.    Marland Kitchen anastrozole (ARIMIDEX) 1 MG tablet Take 1 tablet (1 mg total) by mouth daily. 30 tablet 5  . fluticasone (FLONASE) 50 MCG/ACT nasal spray Place 1 spray into both nostrils daily as needed for allergies or rhinitis.    . hydrocortisone cream 1 % Apply 1 application topically daily as needed for itching.    . loratadine (CLARITIN)  10 MG tablet Take 1 tablet (10 mg total) by mouth daily. (Patient not taking: Reported on 03/29/2019)    . Misc Natural Products (OSTEO BI-FLEX JOINT SHIELD PO) Take 1 capsule by mouth 2 (two) times daily.     Marland Kitchen triptorelin (TRELESTAR LA) 11.25 MG injection Inject 11.25 mg into the muscle every 3 (three) months.     No current facility-administered medications for this visit.     PHYSICAL EXAMINATION: ECOG PERFORMANCE STATUS: 0 - Asymptomatic  BP (!) 104/49 (BP Location: Left Arm, Patient Position: Sitting, Cuff Size: Normal)   Pulse 64   Temp 98 F (36.7 C) (Tympanic)   Wt 150 lb 6 oz (68.2 kg)   BMI 25.81 kg/m   Filed Weights   03/29/19 1329  Weight: 150 lb 6 oz (68.2 kg)   Physical Exam  Constitutional: She is oriented to person, place, and time and well-developed, well-nourished, and in no distress.  Alone.  Walking by self.  HENT:  Head: Normocephalic and atraumatic.  Mouth/Throat: Oropharynx is clear and moist. No oropharyngeal exudate.  Eyes: Pupils are equal, round, and reactive to light.  Neck: Normal range of motion. Neck supple.  Cardiovascular: Normal rate and regular rhythm.  Pulmonary/Chest: No respiratory distress. She has no wheezes.  Abdominal: Soft. Bowel sounds  are normal. She exhibits no distension and no mass. There is no abdominal tenderness. There is no rebound and no guarding.  Musculoskeletal: Normal range of motion.        General: No tenderness or edema.  Neurological: She is alert and oriented to person, place, and time.  Skin: Skin is warm.  Psychiatric: Affect normal.    LABORATORY DATA:  I have reviewed the data as listed    Component Value Date/Time   NA 143 01/25/2019 1227   NA 143 10/13/2017 0920   NA 142 06/19/2011 2227   K 4.2 01/25/2019 1227   K 3.8 06/19/2011 2227   CL 107 01/25/2019 1227   CL 107 06/19/2011 2227   CO2 30 01/25/2019 1227   CO2 21 06/19/2011 2227   GLUCOSE 93 01/25/2019 1227   GLUCOSE 103 (H) 06/19/2011 2227    BUN 17 01/25/2019 1227   BUN 13 10/13/2017 0920   BUN 10 06/19/2011 2227   CREATININE 0.64 01/25/2019 1227   CREATININE 0.75 06/19/2011 2227   CALCIUM 9.0 01/25/2019 1227   CALCIUM 9.5 06/19/2011 2227   PROT 6.9 01/03/2019 0936   PROT 6.5 10/13/2017 0920   ALBUMIN 4.6 01/03/2019 0936   ALBUMIN 4.6 10/13/2017 0920   AST 26 01/03/2019 0936   ALT 21 01/03/2019 0936   ALKPHOS 33 (L) 01/03/2019 0936   BILITOT 0.7 01/03/2019 0936   BILITOT 0.4 10/13/2017 0920   GFRNONAA >60 01/25/2019 1227   GFRNONAA >60 06/19/2011 2227   GFRAA >60 01/25/2019 1227   GFRAA >60 06/19/2011 2227    No results found for: SPEP, UPEP  Lab Results  Component Value Date   WBC 3.4 (L) 01/25/2019   NEUTROABS 1.9 01/25/2019   HGB 11.9 (L) 01/25/2019   HCT 35.9 (L) 01/25/2019   MCV 102.0 (H) 01/25/2019   PLT 184 01/25/2019      Chemistry      Component Value Date/Time   NA 143 01/25/2019 1227   NA 143 10/13/2017 0920   NA 142 06/19/2011 2227   K 4.2 01/25/2019 1227   K 3.8 06/19/2011 2227   CL 107 01/25/2019 1227   CL 107 06/19/2011 2227   CO2 30 01/25/2019 1227   CO2 21 06/19/2011 2227   BUN 17 01/25/2019 1227   BUN 13 10/13/2017 0920   BUN 10 06/19/2011 2227   CREATININE 0.64 01/25/2019 1227   CREATININE 0.75 06/19/2011 2227      Component Value Date/Time   CALCIUM 9.0 01/25/2019 1227   CALCIUM 9.5 06/19/2011 2227   ALKPHOS 33 (L) 01/03/2019 0936   AST 26 01/03/2019 0936   ALT 21 01/03/2019 0936   BILITOT 0.7 01/03/2019 0936   BILITOT 0.4 10/13/2017 0920       RADIOGRAPHIC STUDIES: I have personally reviewed the radiological images as listed and agreed with the findings in the report. No results found.   ASSESSMENT & PLAN:  Malignant neoplasm of upper-outer quadrant of right breast in female, estrogen receptor positive ([place]) #Stage I ER PR positive HER-2 negative [HIGH risk mamma print]-discontinued tamoxifen sec to AEs.  #Since patient is status post BSO-proceed with  Arimidex.  Again reviewed the potential side effects including but not limited to osteoporosis/musculoskeletal symptoms.  # Continue adjuvant Zometa every 6 months [01/2019]  Calcium normal.  # right hand/joint pains- G-1-2; on Osteo Bi-Flex.no improved even after holding endocrine therapy. Recommend rheumatology referral.   # Hot flashes-grade 1; STABLE.   # Peripheral neuropathy- G-1;STABLE.   #  DISPOSITION:  # referral to Rheumatology re: joint pains # follow up in 2 months- NO labs;  -Dr.B   Orders Placed This Encounter  Procedures  . Ambulatory referral to Rheumatology    Referral Priority:   Routine    Referral Type:   Consultation    Referral Reason:   Specialty Services Required    Requested Specialty:   Rheumatology    Number of Visits Requested:   1   All questions were answered. The patient knows to call the clinic with any problems, questions or concerns.      Cammie Sickle, MD 03/31/2019 7:51 AM

## 2019-03-29 NOTE — Assessment & Plan Note (Addendum)
#  Stage I ER PR positive HER-2 negative [HIGH risk mamma print]-discontinued tamoxifen sec to AEs.  #Since patient is status post BSO-proceed with Arimidex.  Again reviewed the potential side effects including but not limited to osteoporosis/musculoskeletal symptoms.  # Continue adjuvant Zometa every 6 months [01/2019]  Calcium normal.  # right hand/joint pains- G-1-2; on Osteo Bi-Flex.no improved even after holding endocrine therapy. Recommend rheumatology referral.   # Hot flashes-grade 1; STABLE.   # Peripheral neuropathy- G-1;STABLE.   # DISPOSITION:  # referral to Rheumatology re: joint pains # follow up in 2 months- NO labs;  -Dr.B

## 2019-04-20 DIAGNOSIS — M19041 Primary osteoarthritis, right hand: Secondary | ICD-10-CM | POA: Insufficient documentation

## 2019-04-27 IMAGING — MG MM BREAST LOCALIZATION CLIP
2 series · 2 of 2 positions shown · non-contrast
Comparison: Previous exam(s).

ADDENDUM:
PATHOLOGY ADDENDUM:

Pathology: Invasive mammary carcinoma with mucinous features. Grade
1.
Pathology concordant with imaging findings: yes
Recommendation: Surgical consultation for further treatment.
Localization/excision considerations: Wire localization
The findings and recommendations were discussed with the office of
the ordering provider Dr. Adejonwo Mikaila, who will further refer the
patient for her necessary appointments.
CLINICAL DATA: S/p ultrasound-guided core needle biopsy of a right
breast mass.
EXAM:
DIAGNOSTIC RIGHT MAMMOGRAM POST ULTRASOUND BIOPSY

[R ML]
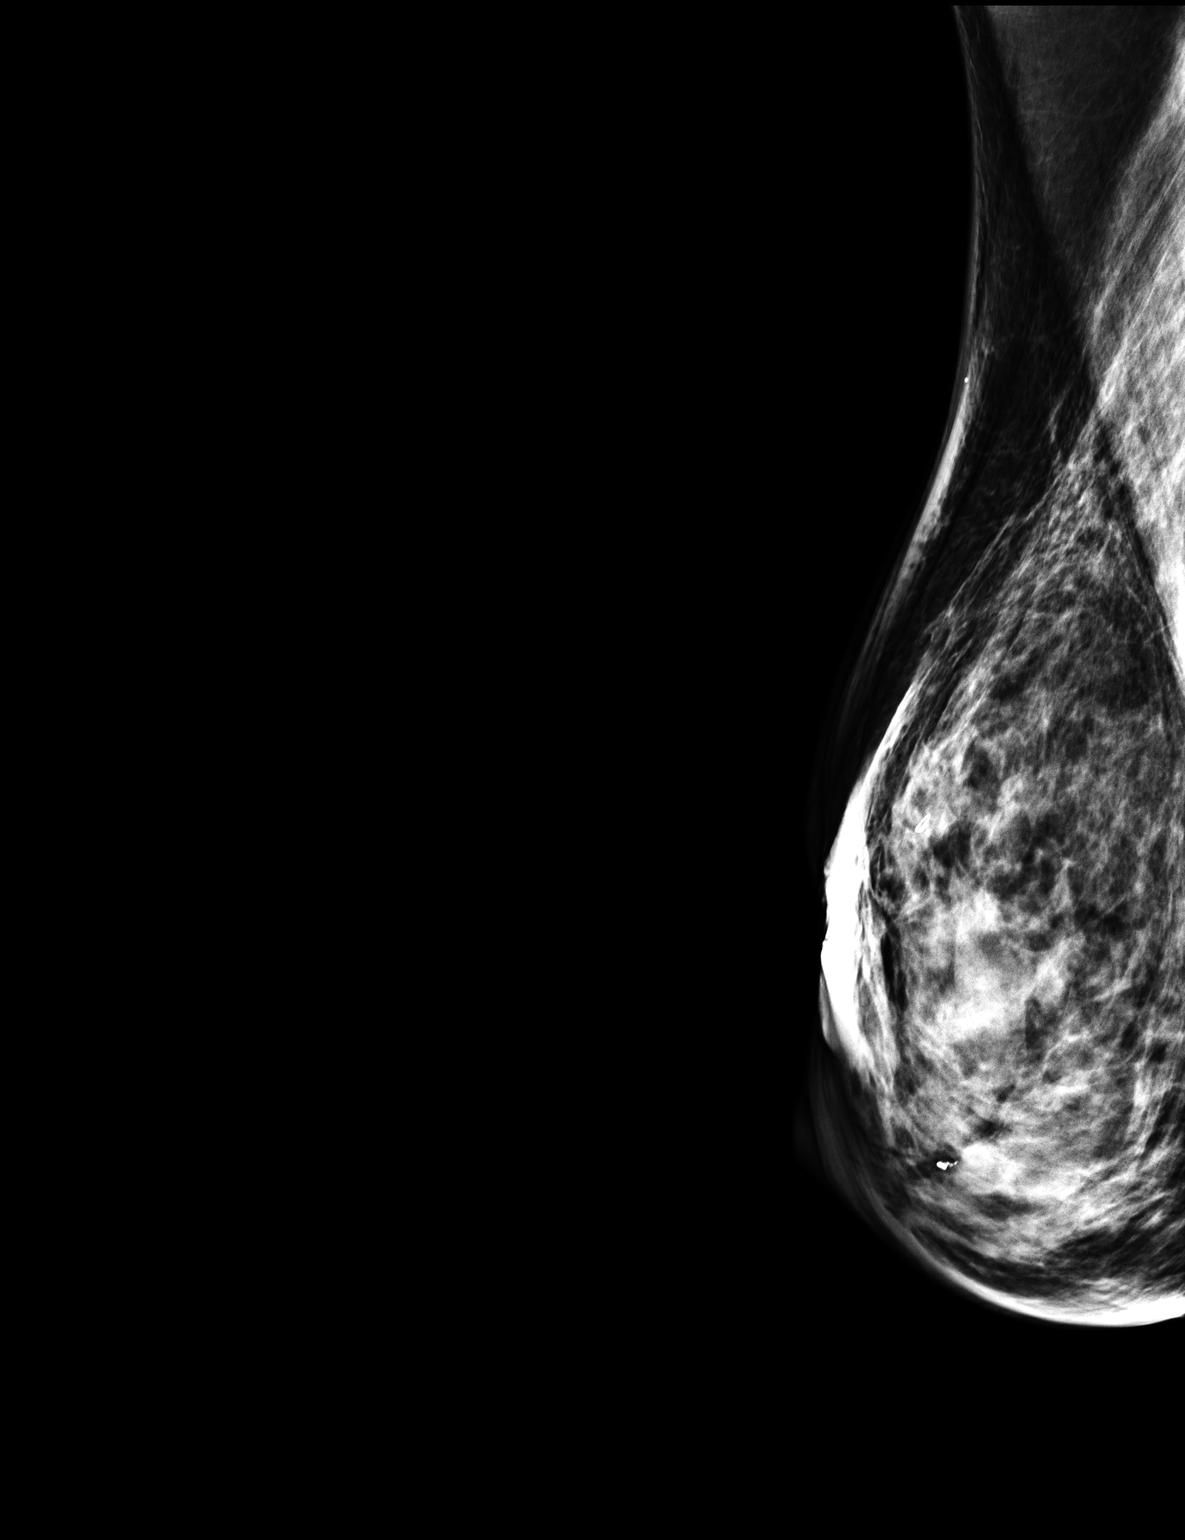

[R CC]
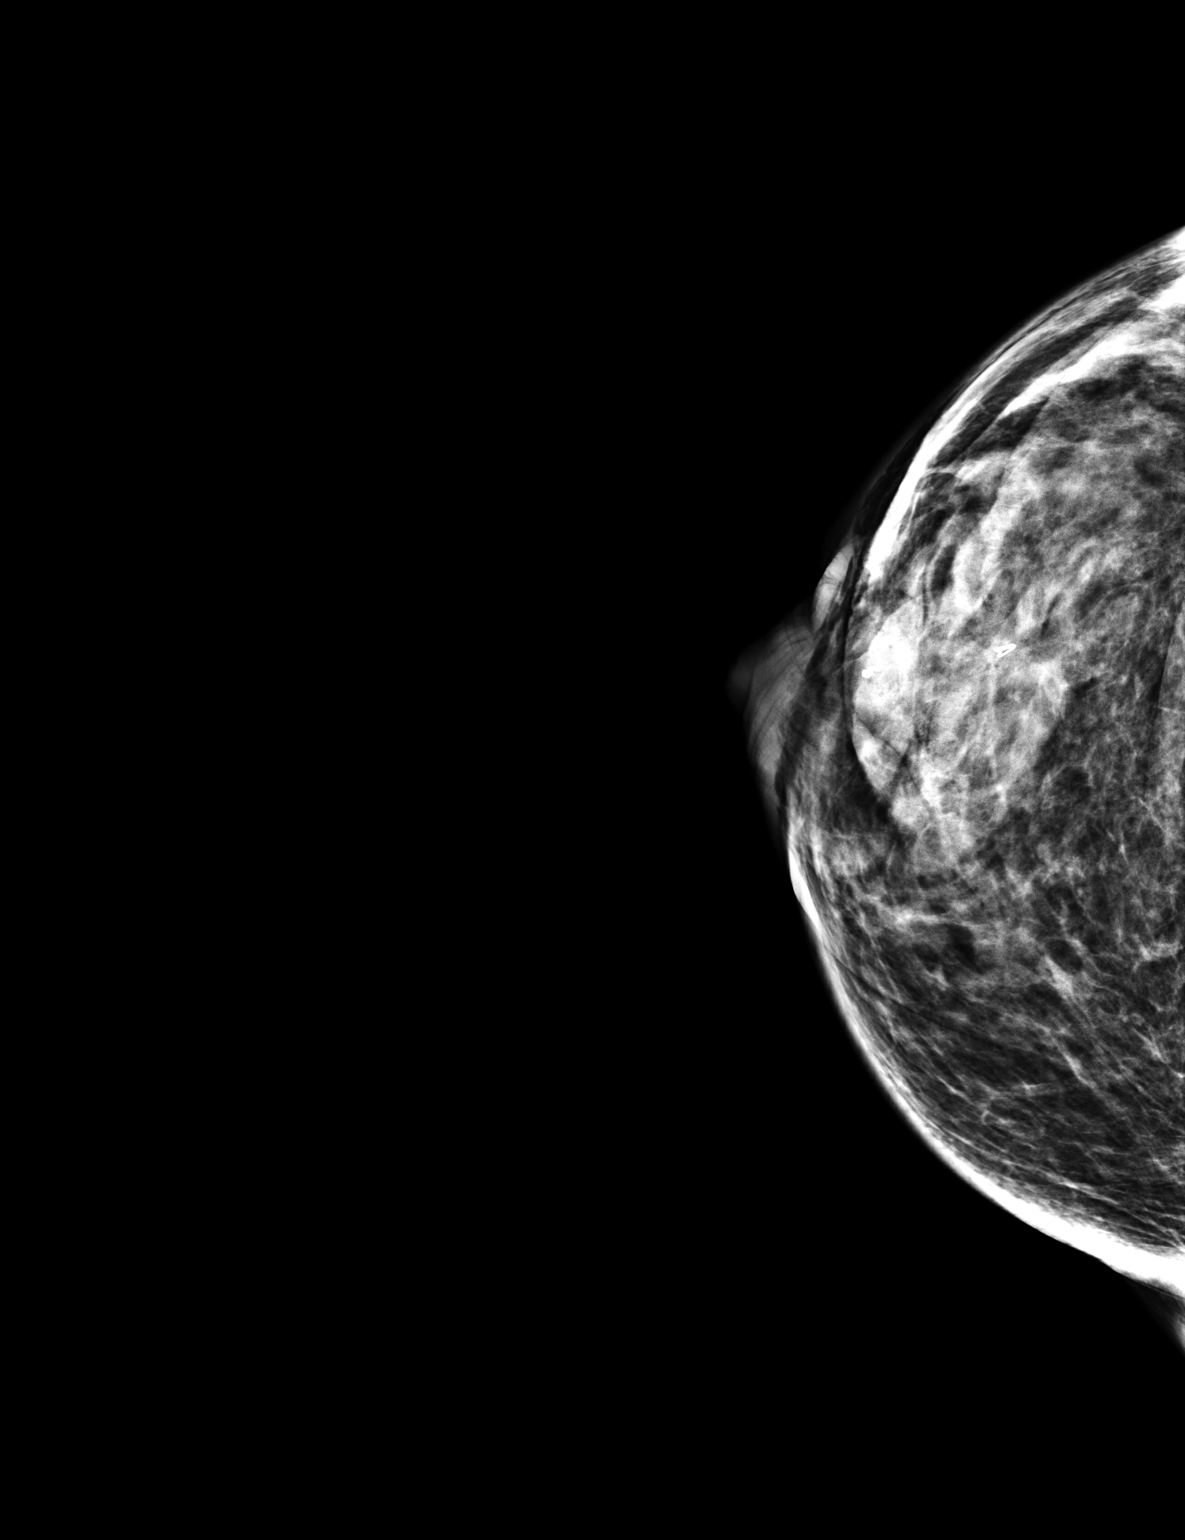

[2 of 2 positions shown; findings below may reference images not displayed]

FINDINGS: Mammographic images were obtained following ultrasound guided biopsy
of a right breast mass. The heart shaped biopsy clip lies within the
mass.
IMPRESSION: Well-positioned heart shaped biopsy clip within a mass in the upper
outer right breast.

Final Assessment: Post Procedure Mammograms for Marker Placement

## 2019-05-23 IMAGING — MG MM BREAST SURGICAL SPECIMEN
1 series · 1 of 1 positions shown · non-contrast
Comparison: none

[R SPECIMEN]
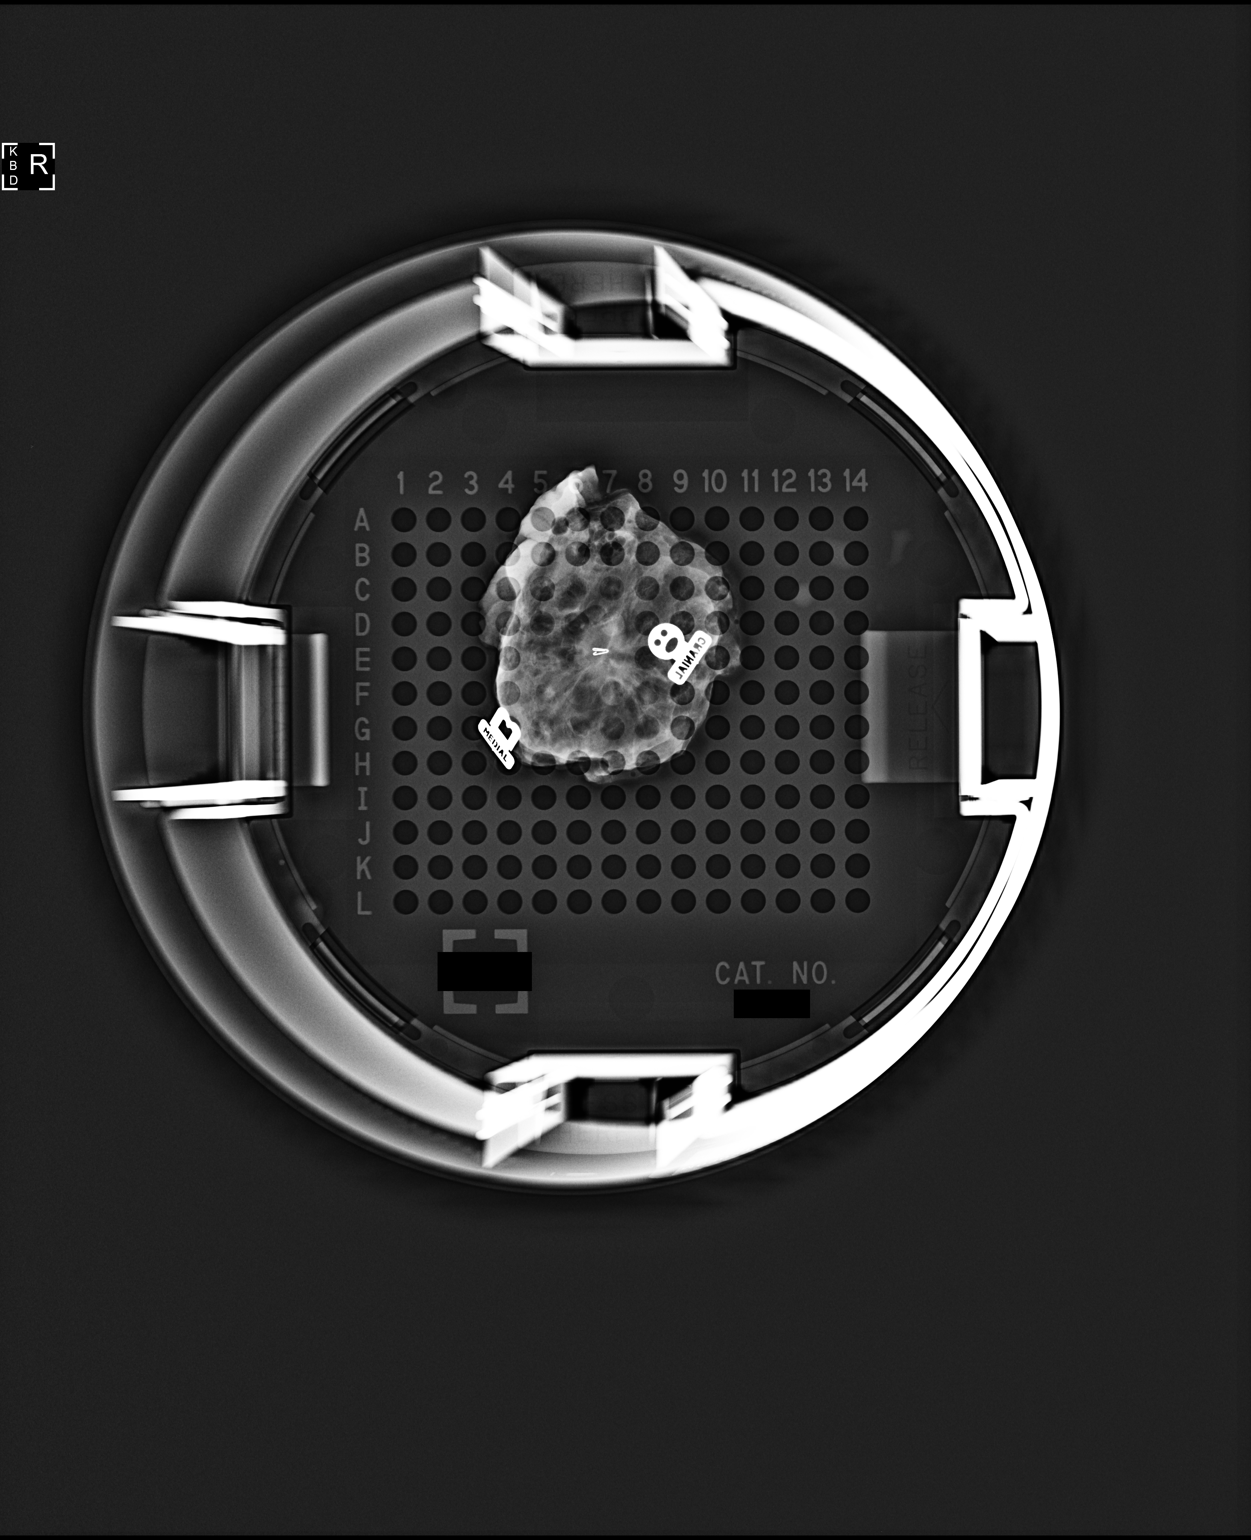

[1 of 1 positions shown; findings below may reference images not displayed]

Canned report from images found in remote index.

Refer to host system for actual result text.

## 2019-05-31 ENCOUNTER — Other Ambulatory Visit: Payer: Self-pay

## 2019-05-31 ENCOUNTER — Inpatient Hospital Stay: Payer: Managed Care, Other (non HMO) | Attending: Internal Medicine | Admitting: Internal Medicine

## 2019-05-31 VITALS — BP 105/44 | HR 66 | Temp 98.3°F | Resp 20

## 2019-05-31 DIAGNOSIS — M1990 Unspecified osteoarthritis, unspecified site: Secondary | ICD-10-CM | POA: Diagnosis not present

## 2019-05-31 DIAGNOSIS — C50411 Malignant neoplasm of upper-outer quadrant of right female breast: Secondary | ICD-10-CM | POA: Diagnosis not present

## 2019-05-31 DIAGNOSIS — Z79899 Other long term (current) drug therapy: Secondary | ICD-10-CM | POA: Insufficient documentation

## 2019-05-31 DIAGNOSIS — Z170 Estrogen receptor positive status [ER+]: Secondary | ICD-10-CM | POA: Diagnosis not present

## 2019-05-31 DIAGNOSIS — G629 Polyneuropathy, unspecified: Secondary | ICD-10-CM | POA: Diagnosis not present

## 2019-05-31 DIAGNOSIS — Z90722 Acquired absence of ovaries, bilateral: Secondary | ICD-10-CM | POA: Diagnosis not present

## 2019-05-31 DIAGNOSIS — N951 Menopausal and female climacteric states: Secondary | ICD-10-CM | POA: Diagnosis not present

## 2019-05-31 MED ORDER — ERGOCALCIFEROL 1.25 MG (50000 UT) PO CAPS: 50000.0000 [IU] | ORAL_CAPSULE | ORAL | 1 refills | Status: DC

## 2019-05-31 NOTE — Assessment & Plan Note (Addendum)
#  Stage I ER PR positive HER-2 negative [HIGH risk mamma print]- on Arimidex + BSO.  Stable.  No evidence of recurrence.  # Continue adjuvant Zometa every 6 months [01/2019]  Calcium normal.  We will plan in March 2021..  # Osteoarthritis-  G-1-2; on Osteo Bi-Flex; on volaten gel.   # Hot flashes-grade 1; stable.   # Peripheral neuropathy- G-1; stable.    # I discussed regarding Covid precautions/and also discussed proceeding with Covid vaccination when available.  Discussed that unfortunately the data safety and efficacy of vaccination is unclear especially in patients with immunocompromised state.  However, I think the benefits of the vaccination outweigh the potential risks.   # DISPOSITION:  # in march 2nd week- lab-bmp;Zometa # follow up in 4 months- NO labs;  -Dr.B

## 2019-05-31 NOTE — Progress Notes (Signed)
[facility] OFFICE PROGRESS NOTE  Patient Care Team: Cletis Athens, MD as PCP - General (Internal Medicine) Bary Castilla, Forest Gleason, MD as Surgeon (General Surgery) Rico Junker, RN as Oncology Nurse Navigator Noreene Filbert, MD as Referring Physician (Radiation Oncology) Cammie Sickle, MD as Consulting Physician (Internal Medicine) Lequita Asal, MD as Referring Physician (Hematology and Oncology)  Cancer Staging No matching staging information was found for the patient.   Oncology History Overview Note  # May 2019- RIGHT BREAST CA s/p Lumpec & SLNBx [pT1c pN0 (sn); G-1;  margins clear; ER/PR > 90%; Her 2 neu- FISH-NEG. MAMMAPRINT [Dr.Byrnett]- HIGH RISK  # July 29th TC x4 [finished 02/15/2018] s/p RT [dec 11th 2019]; # jan 13th 2020- start Tam; STOPPED in SEP 2020- sec to tolerance.  #Nov 2020-Arimidex [s/p BSO]  # March 9th 2020- Triptorelinq 4q; starting June 2020- switch to q12w; August 2020-BSO; discontinue triptorelin.   # march 9th 2020- Zometa 4 mg/ adjuvant   GENETIC TESTING: [Dr.Byrnett] NEGATIVE FOR - ATM/ BRCA2/CHEK2/PTEN/TP53/BRCA1/CDH1/PALB2/ STK11   DIAGNOSIS: BREAST CA   STAGE:  I/mammaprint-H ;GOALS: cure  CURRENT/MOST RECENT THERAPY: Arimidex    Malignant neoplasm of upper-outer quadrant of right breast in female, estrogen receptor positive ([place])  11/03/2017 Initial Diagnosis   Malignant neoplasm of upper-outer quadrant of right breast in female, estrogen receptor positive ([place])       INTERVAL HISTORY:  [facility] 49 y.o.  female pleasant patient above history of stage I breast cancer ER PR positive HER-2/neu negative-BSO plus aromatase inhibitor is here for follow-up.  In the interim patient was evaluated by rheumatology for worsening bilateral hand pain.  Diagnosed with osteoarthritis recommended Voltaren ointment.  Appetite is good.  No weight loss.  Continues to have mild hot flashes.  Continues to have mild tingling  and numbness.   Review of Systems  Constitutional: Positive for malaise/fatigue. Negative for chills, diaphoresis, fever and weight loss.  HENT: Negative for nosebleeds and sore throat.   Eyes: Negative for double vision.  Respiratory: Negative for cough, hemoptysis, sputum production, shortness of breath and wheezing.   Cardiovascular: Negative for chest pain, palpitations, orthopnea and leg swelling.  Gastrointestinal: Negative for abdominal pain, blood in stool, constipation, heartburn, melena, nausea and vomiting.  Genitourinary: Negative for dysuria, frequency and urgency.  Musculoskeletal: Positive for joint pain. Negative for back pain.  Skin: Negative for itching.  Neurological: Positive for tingling. Negative for dizziness, focal weakness, weakness and headaches.  Endo/Heme/Allergies: Does not bruise/bleed easily.  Psychiatric/Behavioral: Negative for depression. The patient is not nervous/anxious and does not have insomnia.       PAST MEDICAL HISTORY :  Past Medical History:  Diagnosis Date  . BRCA negative 09/2016   [facility]  . 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  Mammoprint: High risk.   Marland Kitchen Dysrhythmia    atrial tach/ 2013  . Personal history of chemotherapy   . Personal history of radiation therapy     PAST SURGICAL HISTORY :   Past Surgical History:  Procedure Laterality Date  . 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 BREAST LUMPECTOMY Right 10/26/2017  . BREAST LUMPECTOMY WITH SENTINEL LYMPH NODE BIOPSY Right 10/26/2017   Procedure: BREAST LUMPECTOMY WITH SENTINEL LYMPH NODE BX;  Surgeon: Robert Bellow, MD;  Location: [facility] ORS;  Service: General;  Laterality: Right;  . COLONOSCOPY  WITH PROPOFOL N/A 12/01/2018   Procedure: COLONOSCOPY WITH PROPOFOL;  Surgeon: Robert Bellow, MD;  Location: [facility] ENDOSCOPY;   Service: Endoscopy;  Laterality: N/A;  . LAPAROSCOPIC BILATERAL SALPINGO OOPHERECTOMY N/A 01/06/2019   Procedure: LAPAROSCOPIC BILATERAL SALPINGO OOPHORECTOMY;  Surgeon: Will Bonnet, MD;  Location: [facility] ORS;  Service: Gynecology;  Laterality: N/A;  . TONSILLECTOMY  2015    FAMILY HISTORY :   Family History  Problem Relation Age of Onset  . Breast cancer Maternal Aunt 60  . Breast cancer Maternal Grandmother 71  . Colon cancer Mother 102    SOCIAL HISTORY:   Social History   Tobacco Use  . Smoking status: Never Smoker  . Smokeless tobacco: Never Used  Substance Use Topics  . Alcohol use: Never  . Drug use: Never    ALLERGIES:  has No Known Allergies.  MEDICATIONS:  Current Outpatient Medications  Medication Sig Dispense Refill  . acetaminophen (TYLENOL) 650 MG CR tablet Take 1 tablet by mouth 3 (three) times daily. Prn pain    . diclofenac Sodium (VOLTAREN) 1 % GEL Apply 2 g topically 4 (four) times daily.    Marland Kitchen anastrozole (ARIMIDEX) 1 MG tablet Take 1 tablet (1 mg total) by mouth daily. 30 tablet 5  . Calcium Carb-Cholecalciferol (CALCIUM-VITAMIN D3) 600-500 MG-UNIT CAPS Take 1 capsule by mouth 2 (two) times daily.    . ergocalciferol (VITAMIN D2) 1.25 MG (50000 UT) capsule Take 1 capsule (50,000 Units total) by mouth once a week. 12 capsule 1  . fluticasone (FLONASE) 50 MCG/ACT nasal spray Place 1 spray into both nostrils daily as needed for allergies or rhinitis.    . hydrocortisone cream 1 % Apply 1 application topically daily as needed for itching.    Marland Kitchen ibuprofen (ADVIL) 600 MG tablet Take 1 tablet (600 mg total) by mouth every 6 (six) hours as needed for mild pain or cramping. 30 tablet 0  . loratadine (CLARITIN) 10 MG tablet Take 1 tablet (10 mg total) by mouth daily. (Patient not taking: Reported on 03/29/2019)    . Misc Natural Products (OSTEO BI-FLEX JOINT SHIELD PO) Take 1 capsule by mouth 2 (two) times daily.     . Misc Natural Products (OSTEO BI-FLEX/5-LOXIN  ADVANCED PO) Take 1 capsule by mouth 2 (two) times daily.    . Multiple Vitamin (MULTIVITAMIN WITH MINERALS) TABS tablet Take 1 tablet by mouth daily.    . Probiotic Product ([place]) CAPS Take 1 capsule by mouth every evening.     Marland Kitchen triptorelin (TRELESTAR LA) 11.25 MG injection Inject 11.25 mg into the muscle every 3 (three) months.    . Zoledronic Acid (ZOMETA) 4 MG/100ML IVPB Inject 4 mg into the vein every 6 (six) months.     No current facility-administered medications for this visit.    PHYSICAL EXAMINATION: ECOG PERFORMANCE STATUS: 0 - Asymptomatic  BP (!) 105/44 (BP Location: Left Arm, Patient Position: Sitting)   Pulse 66   Temp 98.3 F (36.8 C) (Tympanic)   Resp 20   There were no vitals filed for this visit. Physical Exam  Constitutional: She is oriented to person, place, and time and well-developed, well-nourished, and in no distress.  Alone.  Walking by self.  HENT:  Head: Normocephalic and atraumatic.  Mouth/Throat: Oropharynx is clear and moist. No oropharyngeal exudate.  Eyes: Pupils are equal, round, and reactive to light.  Cardiovascular: Normal rate and regular rhythm.  Pulmonary/Chest: No respiratory distress. She has no wheezes.  Abdominal: Soft. Bowel  sounds are normal. She exhibits no distension and no mass. There is no abdominal tenderness. There is no rebound and no guarding.  Musculoskeletal:        General: No tenderness or edema. Normal range of motion.     Cervical back: Normal range of motion and neck supple.  Neurological: She is alert and oriented to person, place, and time.  Skin: Skin is warm.  Psychiatric: Affect normal.    LABORATORY DATA:  I have reviewed the data as listed    Component Value Date/Time   NA 143 01/25/2019 1227   NA 143 10/13/2017 0920   NA 142 06/19/2011 2227   K 4.2 01/25/2019 1227   K 3.8 06/19/2011 2227   CL 107 01/25/2019 1227   CL 107 06/19/2011 2227   CO2 30 01/25/2019 1227   CO2 21 06/19/2011  2227   GLUCOSE 93 01/25/2019 1227   GLUCOSE 103 (H) 06/19/2011 2227   BUN 17 01/25/2019 1227   BUN 13 10/13/2017 0920   BUN 10 06/19/2011 2227   CREATININE 0.64 01/25/2019 1227   CREATININE 0.75 06/19/2011 2227   CALCIUM 9.0 01/25/2019 1227   CALCIUM 9.5 06/19/2011 2227   PROT 6.9 01/03/2019 0936   PROT 6.5 10/13/2017 0920   ALBUMIN 4.6 01/03/2019 0936   ALBUMIN 4.6 10/13/2017 0920   AST 26 01/03/2019 0936   ALT 21 01/03/2019 0936   ALKPHOS 33 (L) 01/03/2019 0936   BILITOT 0.7 01/03/2019 0936   BILITOT 0.4 10/13/2017 0920   GFRNONAA >60 01/25/2019 1227   GFRNONAA >60 06/19/2011 2227   GFRAA >60 01/25/2019 1227   GFRAA >60 06/19/2011 2227    No results found for: SPEP, UPEP  Lab Results  Component Value Date   WBC 3.4 (L) 01/25/2019   NEUTROABS 1.9 01/25/2019   HGB 11.9 (L) 01/25/2019   HCT 35.9 (L) 01/25/2019   MCV 102.0 (H) 01/25/2019   PLT 184 01/25/2019      Chemistry      Component Value Date/Time   NA 143 01/25/2019 1227   NA 143 10/13/2017 0920   NA 142 06/19/2011 2227   K 4.2 01/25/2019 1227   K 3.8 06/19/2011 2227   CL 107 01/25/2019 1227   CL 107 06/19/2011 2227   CO2 30 01/25/2019 1227   CO2 21 06/19/2011 2227   BUN 17 01/25/2019 1227   BUN 13 10/13/2017 0920   BUN 10 06/19/2011 2227   CREATININE 0.64 01/25/2019 1227   CREATININE 0.75 06/19/2011 2227      Component Value Date/Time   CALCIUM 9.0 01/25/2019 1227   CALCIUM 9.5 06/19/2011 2227   ALKPHOS 33 (L) 01/03/2019 0936   AST 26 01/03/2019 0936   ALT 21 01/03/2019 0936   BILITOT 0.7 01/03/2019 0936   BILITOT 0.4 10/13/2017 0920       RADIOGRAPHIC STUDIES: I have personally reviewed the radiological images as listed and agreed with the findings in the report. No results found.   ASSESSMENT & PLAN:  Malignant neoplasm of upper-outer quadrant of right breast in female, estrogen receptor positive ([place]) #Stage I ER PR positive HER-2 negative [HIGH risk mamma print]- on Arimidex + BSO.   Stable.  No evidence of recurrence.  # Continue adjuvant Zometa every 6 months [01/2019]  Calcium normal.  We will plan in March 2021..  # Osteoarthritis-  G-1-2; on Osteo Bi-Flex; on volaten gel.   # Hot flashes-grade 1; stable.   # Peripheral neuropathy- G-1; stable.    #  I discussed regarding Covid precautions/and also discussed proceeding with Covid vaccination when available.  Discussed that unfortunately the data safety and efficacy of vaccination is unclear especially in patients with immunocompromised state.  However, I think the benefits of the vaccination outweigh the potential risks.   # DISPOSITION:  # in march 2nd week- lab-bmp;Zometa # follow up in 4 months- NO labs;  -Dr.B   Orders Placed This Encounter  Procedures  . Basic metabolic panel    Standing Status:   Future    Standing Expiration Date:   05/30/2020   All questions were answered. The patient knows to call the clinic with any problems, questions or concerns.      Cammie Sickle, MD 05/31/2019 1:48 PM

## 2019-06-13 ENCOUNTER — Other Ambulatory Visit: Payer: Self-pay | Admitting: *Deleted

## 2019-06-13 MED ORDER — ANASTROZOLE 1 MG PO TABS: 1.0000 mg | ORAL_TABLET | Freq: Every day | ORAL | 0 refills | Status: DC

## 2019-07-18 ENCOUNTER — Other Ambulatory Visit: Payer: Self-pay

## 2019-07-19 ENCOUNTER — Inpatient Hospital Stay: Payer: Managed Care, Other (non HMO) | Attending: Internal Medicine

## 2019-07-19 ENCOUNTER — Other Ambulatory Visit: Payer: Self-pay

## 2019-07-19 ENCOUNTER — Inpatient Hospital Stay: Payer: Managed Care, Other (non HMO)

## 2019-07-19 VITALS — BP 115/70 | HR 64 | Resp 20

## 2019-07-19 DIAGNOSIS — R5383 Other fatigue: Secondary | ICD-10-CM | POA: Insufficient documentation

## 2019-07-19 DIAGNOSIS — Z923 Personal history of irradiation: Secondary | ICD-10-CM | POA: Diagnosis not present

## 2019-07-19 DIAGNOSIS — Z9221 Personal history of antineoplastic chemotherapy: Secondary | ICD-10-CM | POA: Insufficient documentation

## 2019-07-19 DIAGNOSIS — Z800 Family history of malignant neoplasm of digestive organs: Secondary | ICD-10-CM | POA: Insufficient documentation

## 2019-07-19 DIAGNOSIS — M1990 Unspecified osteoarthritis, unspecified site: Secondary | ICD-10-CM | POA: Insufficient documentation

## 2019-07-19 DIAGNOSIS — G629 Polyneuropathy, unspecified: Secondary | ICD-10-CM | POA: Diagnosis not present

## 2019-07-19 DIAGNOSIS — Z803 Family history of malignant neoplasm of breast: Secondary | ICD-10-CM | POA: Insufficient documentation

## 2019-07-19 DIAGNOSIS — R232 Flushing: Secondary | ICD-10-CM | POA: Diagnosis not present

## 2019-07-19 DIAGNOSIS — Z79899 Other long term (current) drug therapy: Secondary | ICD-10-CM | POA: Insufficient documentation

## 2019-07-19 DIAGNOSIS — Z170 Estrogen receptor positive status [ER+]: Secondary | ICD-10-CM | POA: Diagnosis not present

## 2019-07-19 DIAGNOSIS — C50411 Malignant neoplasm of upper-outer quadrant of right female breast: Secondary | ICD-10-CM | POA: Insufficient documentation

## 2019-07-19 DIAGNOSIS — M2550 Pain in unspecified joint: Secondary | ICD-10-CM | POA: Insufficient documentation

## 2019-07-19 LAB — BASIC METABOLIC PANEL
Anion gap: 11 (ref 5–15)
BUN: 16 mg/dL (ref 6–20)
CO2: 25 mmol/L (ref 22–32)
Calcium: 9.6 mg/dL (ref 8.9–10.3)
Chloride: 105 mmol/L (ref 98–111)
Creatinine, Ser: 0.59 mg/dL (ref 0.44–1.00)
GFR calc Af Amer: >60 mL/min (ref >60)
GFR calc non Af Amer: >60 mL/min (ref >60)
Glucose, Bld: 91 mg/dL (ref 70–99)
Potassium: 4.1 mmol/L (ref 3.5–5.1)
Sodium: 141 mmol/L (ref 135–145)

## 2019-07-19 MED ORDER — SODIUM CHLORIDE 0.9 % IV SOLN
Freq: Once | INTRAVENOUS | Status: AC
  Filled 2019-07-19: qty 250

## 2019-07-19 MED ORDER — ZOLEDRONIC ACID 4 MG/100ML IV SOLN
4.0000 mg | Freq: Once | INTRAVENOUS | Status: AC
  Administered 2019-07-19: 4 mg via INTRAVENOUS
  Filled 2019-07-19: qty 100

## 2019-08-14 ENCOUNTER — Other Ambulatory Visit: Payer: Self-pay | Admitting: Internal Medicine

## 2019-09-09 IMAGING — US US BREAST*R* LIMITED INC AXILLA
1 series · 12 of 12 positions shown · non-contrast
Comparison: Previous exam(s).

CLINICAL DATA: Patient presents with a palpable lump in the right
breast which she has noted for approximately 1 week.

EXAM:
DIGITAL DIAGNOSTIC BILATERAL MAMMOGRAM WITH CAD
ULTRASOUND RIGHT BREAST

[Series 1: us breast*right* limited inc axilla · 0.06mm/px · 12 of 12 slices shown]
[im 1/12]
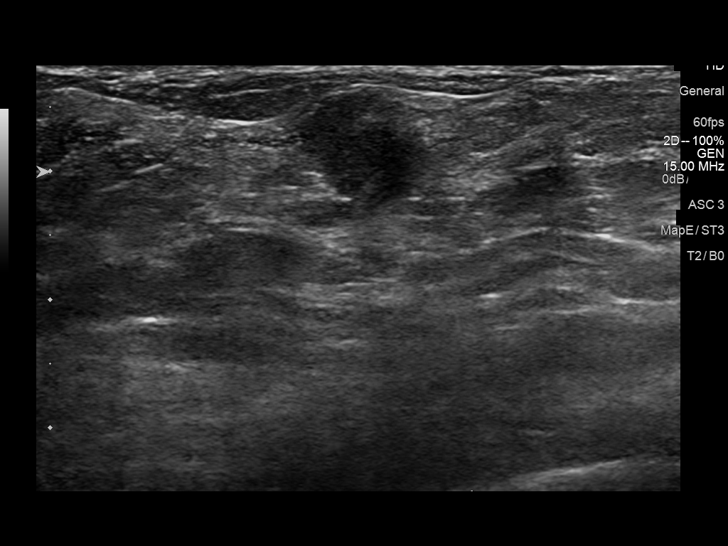
[im 2/12]
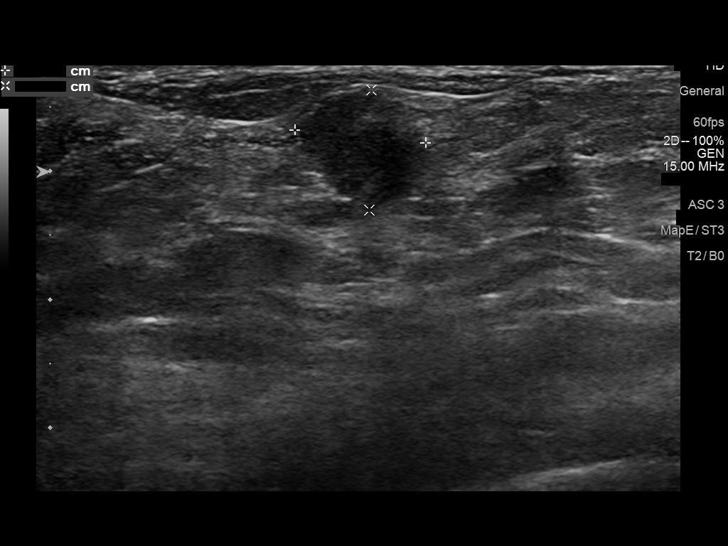
[im 3/12]
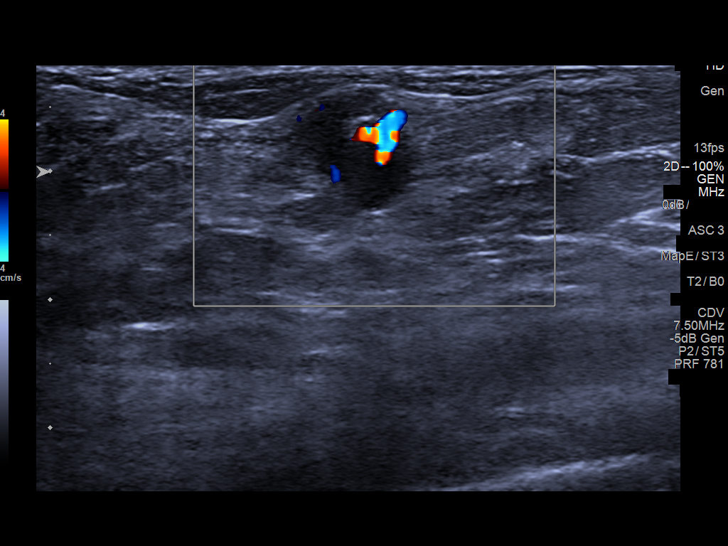
[im 4/12]
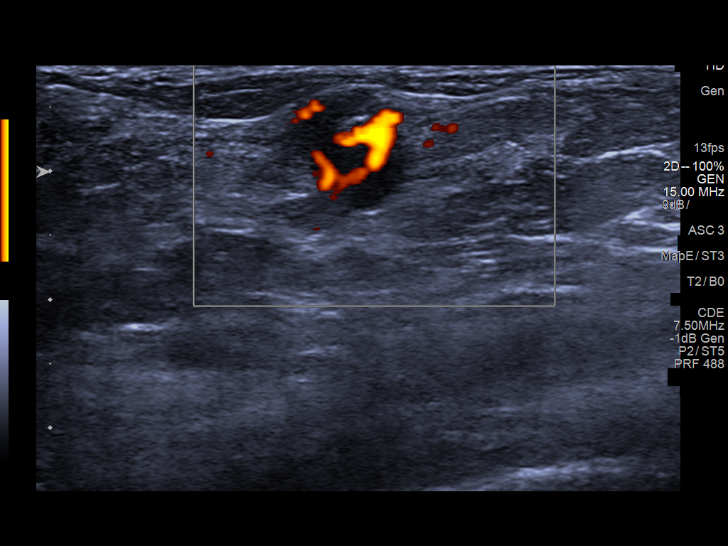
[im 5/12]
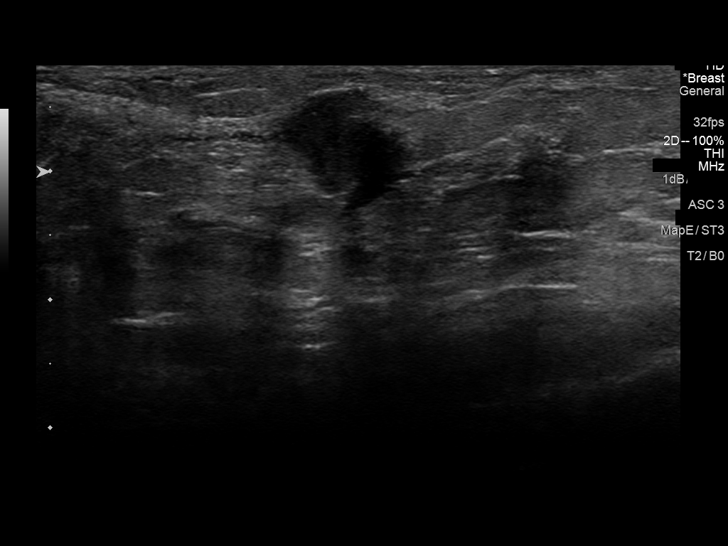
[im 6/12]
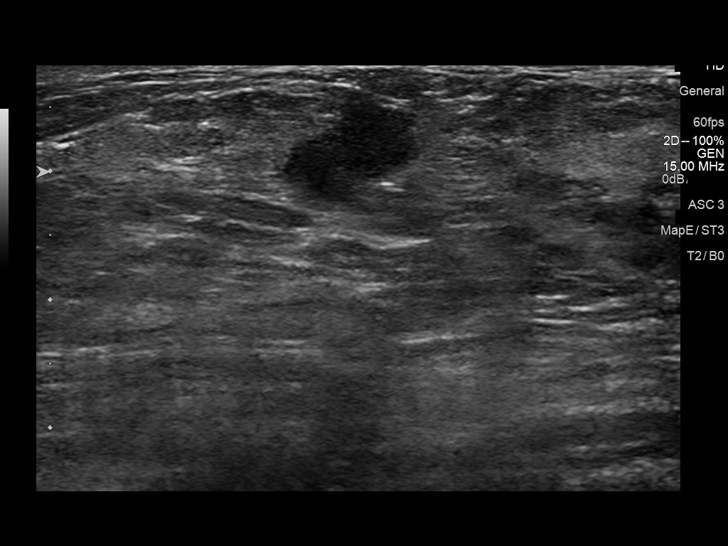
[im 7/12]
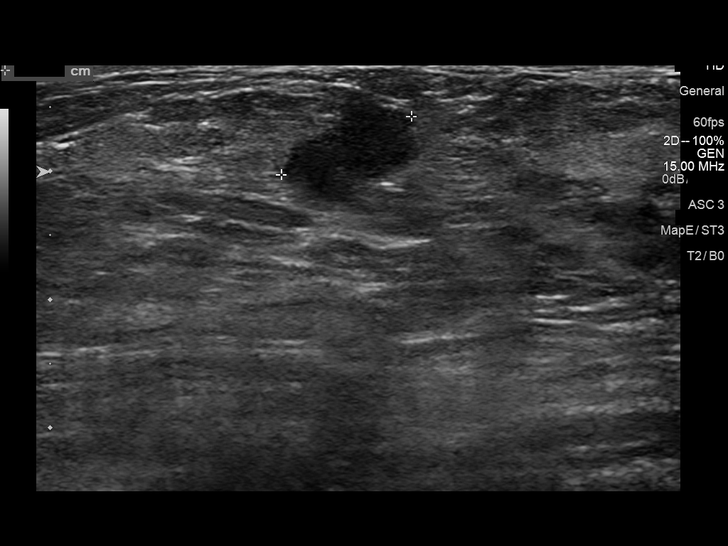
[im 8/12]
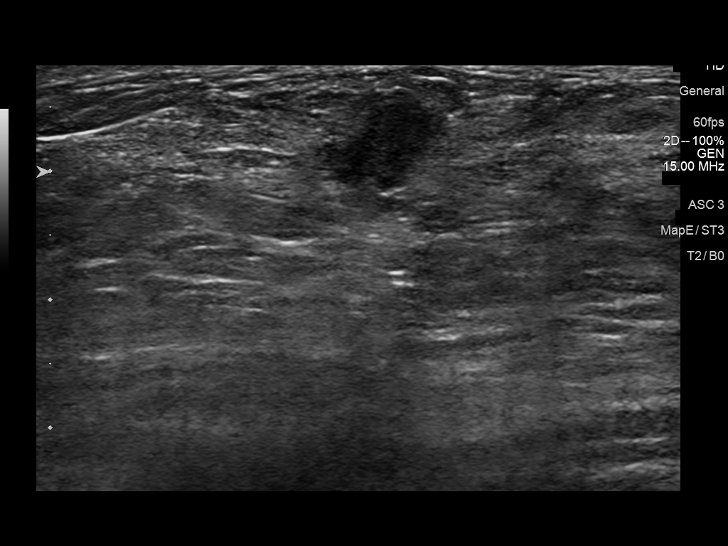
[im 9/12]
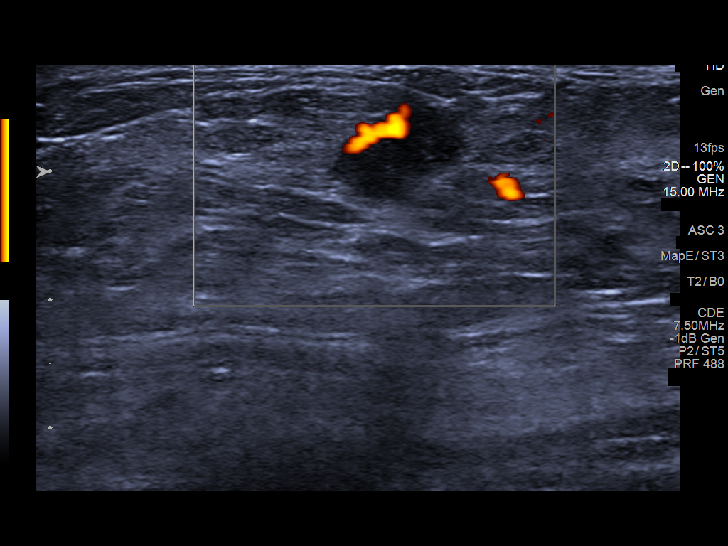
[im 10/12]
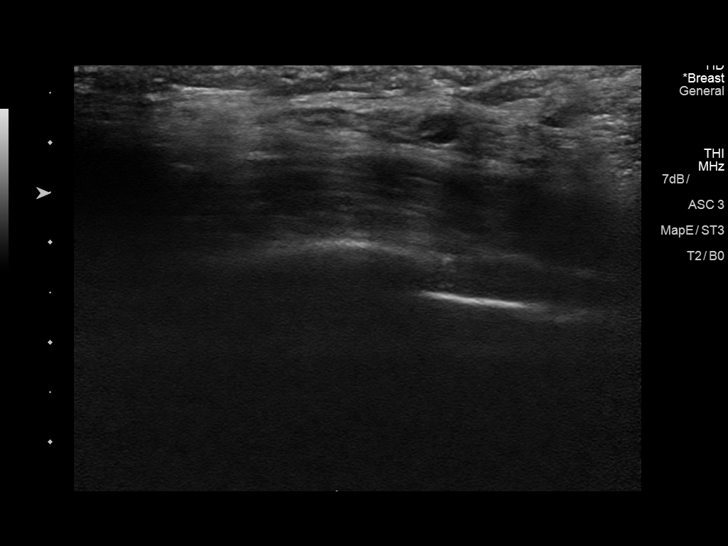
[im 11/12]
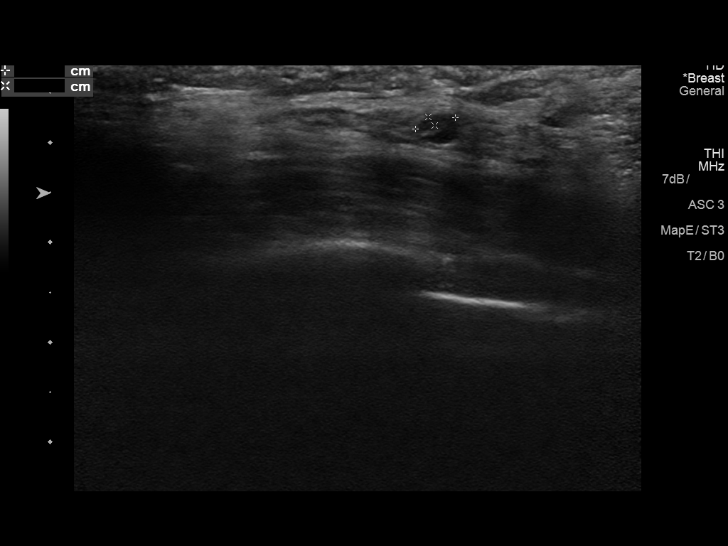
[im 12/12]
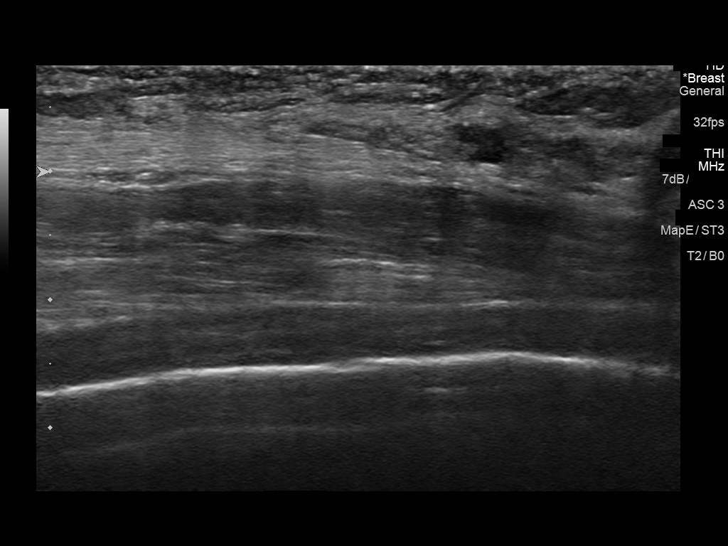

[12 of 12 positions shown; findings below may reference images not displayed]

ACR Breast Density Category c: The breast tissue is heterogeneously
dense, which may obscure small masses.
FINDINGS: There is a focal density with associated architectural distortion in
the upper outer right breast, best seen on the CC view,
corresponding to the palpable abnormality.

There is no other evidence of a mass. There are no other areas of
architectural distortion. There are no new or suspicious
calcifications.

Mammographic images were processed with CAD.

On physical exam, there is a firm superficial palpable mass in the
upper outer right breast.

Targeted ultrasound is performed, showing a hypoechoic vascular mass
with irregular, partly ill-defined margins, superficially, just
below the skin, at 11-12 o'clock, 3 cm the nipple, corresponding to
the palpable abnormality. This measures 11 x 9 x 10 mm. There are no
additional upper-outer quadrant masses.

Sonographic evaluation of the right axilla shows no enlarged or
abnormal lymph nodes.
IMPRESSION: 1. 11 mm mass at the 11 to 12 o'clock position of the right breast,
associated with architectural distortion mammographically. This is
highly suspicious for invasive mammary carcinoma. Biopsy is
indicated.

RECOMMENDATION:
Ultrasound-guided core needle biopsy of the upper-outer quadrant
right breast mass.

I have discussed the findings and recommendations with the patient.
Results were also provided in writing at the conclusion of the
visit. If applicable, a reminder letter will be sent to the patient
regarding the next appointment.

BI-RADS CATEGORY  5: Highly suggestive of malignancy.

## 2019-09-21 ENCOUNTER — Other Ambulatory Visit: Payer: Self-pay | Admitting: General Surgery

## 2019-09-21 DIAGNOSIS — C50411 Malignant neoplasm of upper-outer quadrant of right female breast: Secondary | ICD-10-CM

## 2019-09-27 ENCOUNTER — Inpatient Hospital Stay: Payer: Managed Care, Other (non HMO) | Attending: Internal Medicine | Admitting: Internal Medicine

## 2019-09-27 ENCOUNTER — Other Ambulatory Visit: Payer: Self-pay

## 2019-09-27 DIAGNOSIS — Z79811 Long term (current) use of aromatase inhibitors: Secondary | ICD-10-CM | POA: Insufficient documentation

## 2019-09-27 DIAGNOSIS — Z170 Estrogen receptor positive status [ER+]: Secondary | ICD-10-CM | POA: Diagnosis not present

## 2019-09-27 DIAGNOSIS — C50411 Malignant neoplasm of upper-outer quadrant of right female breast: Secondary | ICD-10-CM | POA: Diagnosis not present

## 2019-09-27 DIAGNOSIS — Z90722 Acquired absence of ovaries, bilateral: Secondary | ICD-10-CM | POA: Insufficient documentation

## 2019-09-27 NOTE — Assessment & Plan Note (Addendum)
#  Stage I ER PR positive HER-2 negative [HIGH risk mamma print]- on Arimidex + BSO.  STABLE;   No evidence of recurrence.  # Continue adjuvant Zometa every 6 months [07/2019]; again next in 4 months.   # Osteoarthritis-  G-1; on Osteo Bi-Flex; on volaten gel.   # Hot flashes-grade 1- Stable.   # BMD- June 2020- T score-Normal; on ca+vit D  # Peripheral neuropathy- G-1; STABLE  # DISPOSITION:  #  bil mammogram June 2021.  # follow up in 4 months; MD; labs- cbc/cmp/Zometa;  -Dr.B

## 2019-09-27 NOTE — Progress Notes (Signed)
RN Chaperoned provider with Breast Exam.   

## 2019-09-27 NOTE — Progress Notes (Signed)
[place] OFFICE PROGRESS NOTE  Patient Care Team: Cletis Athens, MD as PCP - General (Internal Medicine) Bary Castilla, Forest Gleason, MD as Surgeon (General Surgery) Rico Junker, RN as Oncology Nurse Navigator Noreene Filbert, MD as Referring Physician (Radiation Oncology) Cammie Sickle, MD as Consulting Physician (Internal Medicine) Lequita Asal, MD as Referring Physician (Hematology and Oncology)  Cancer Staging No matching staging information was found for the patient.   Oncology History Overview Note  # May 2019- RIGHT BREAST CA s/p Lumpec & SLNBx [pT1c pN0 (sn); G-1;  margins clear; ER/PR > 90%; Her 2 neu- FISH-NEG. MAMMAPRINT [Dr.Byrnett]- HIGH RISK  # July 29th TC x4 [finished 02/15/2018] s/p RT [dec 11th 2019]; # jan 13th 2020- start Tam; STOPPED in SEP 2020- sec to tolerance.  #Nov 2020-Arimidex [s/p BSO]  # March 9th 2020- Triptorelinq 4q; starting June 2020- switch to q12w; August 2020-BSO; discontinue triptorelin.   # march 9th 2020- Zometa 4 mg/ adjuvant   GENETIC TESTING: [Dr.Byrnett] NEGATIVE FOR - ATM/ BRCA2/CHEK2/PTEN/TP53/BRCA1/CDH1/PALB2/ STK11   DIAGNOSIS: BREAST CA   STAGE:  I/mammaprint-H ;GOALS: cure  CURRENT/MOST RECENT THERAPY: Arimidex    Malignant neoplasm of upper-outer quadrant of right breast in female, estrogen receptor positive ([place])  11/03/2017 Initial Diagnosis   Malignant neoplasm of upper-outer quadrant of right breast in female, estrogen receptor positive ([place])       INTERVAL HISTORY:  [facility] 49 y.o.  female pleasant patient above history of stage I breast cancer ER PR positive HER-2/neu negative-BSO plus aromatase inhibitor is here for follow-up.  Patient denies any worsening joint pains or bone pain.  Appetite is good.  No weight loss.  No new lumps or bumps.   Review of Systems  Constitutional: Positive for malaise/fatigue. Negative for chills, diaphoresis, fever and weight loss.  HENT:  Negative for nosebleeds and sore throat.   Eyes: Negative for double vision.  Respiratory: Negative for cough, hemoptysis, sputum production, shortness of breath and wheezing.   Cardiovascular: Negative for chest pain, palpitations, orthopnea and leg swelling.  Gastrointestinal: Negative for abdominal pain, blood in stool, constipation, heartburn, melena, nausea and vomiting.  Genitourinary: Negative for dysuria, frequency and urgency.  Musculoskeletal: Positive for joint pain. Negative for back pain.  Skin: Negative for itching.  Neurological: Positive for tingling. Negative for dizziness, focal weakness, weakness and headaches.  Endo/Heme/Allergies: Does not bruise/bleed easily.  Psychiatric/Behavioral: Negative for depression. The patient is not nervous/anxious and does not have insomnia.       PAST MEDICAL HISTORY :  Past Medical History:  Diagnosis Date  . BRCA negative 09/2016   [facility]  . Breast CA ([place]) 09/30/2017   INVASIVE MAMMARY CARCINOMA WITH MUCINOUS FEATURES/ ER/PR 90%; Her 2 neu: Negative.  Mammoprint: High risk.   Marland Kitchen Dysrhythmia    atrial tach/ 2013  . Personal history of chemotherapy   . Personal history of radiation therapy     PAST SURGICAL HISTORY :   Past Surgical History:  Procedure Laterality Date  . 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 BREAST LUMPECTOMY Right 10/26/2017  . BREAST LUMPECTOMY WITH SENTINEL LYMPH NODE BIOPSY Right 10/26/2017   Procedure: BREAST LUMPECTOMY WITH SENTINEL LYMPH NODE BX;  Surgeon: Robert Bellow, MD;  Location: [facility] ORS;  Service: General;  Laterality: Right;  . COLONOSCOPY WITH PROPOFOL N/A 12/01/2018   Procedure: COLONOSCOPY WITH PROPOFOL;  Surgeon: Robert Bellow, MD;  Location: [facility] ENDOSCOPY;  Service: Endoscopy;  Laterality: N/A;  . LAPAROSCOPIC BILATERAL SALPINGO OOPHERECTOMY N/A 01/06/2019   Procedure: LAPAROSCOPIC BILATERAL SALPINGO  OOPHORECTOMY;  Surgeon: Will Bonnet, MD;  Location: [facility] ORS;  Service: Gynecology;  Laterality: N/A;  . TONSILLECTOMY  2015    FAMILY HISTORY :   Family History  Problem Relation Age of Onset  . Breast cancer Maternal Aunt 60  . Breast cancer Maternal Grandmother 34  . Colon cancer Mother 83    SOCIAL HISTORY:   Social History   Tobacco Use  . Smoking status: Never Smoker  . Smokeless tobacco: Never Used  Substance Use Topics  . Alcohol use: Never  . Drug use: Never    ALLERGIES:  has No Known Allergies.  MEDICATIONS:  Current Outpatient Medications  Medication Sig Dispense Refill  . acetaminophen (TYLENOL) 650 MG CR tablet Take 1 tablet by mouth 3 (three) times daily. Prn pain    . anastrozole (ARIMIDEX) 1 MG tablet TAKE 1 TABLET BY MOUTH  DAILY 90 tablet 3  . Calcium Carb-Cholecalciferol (CALCIUM-VITAMIN D3) 600-500 MG-UNIT CAPS Take 1 capsule by mouth 2 (two) times daily.    . diclofenac Sodium (VOLTAREN) 1 % GEL Apply 2 g topically 4 (four) times daily.    . ergocalciferol (VITAMIN D2) 1.25 MG (50000 UT) capsule Take 1 capsule (50,000 Units total) by mouth once a week. 12 capsule 1  . fluticasone (FLONASE) 50 MCG/ACT nasal spray Place 1 spray into both nostrils daily as needed for allergies or rhinitis.    . hydrocortisone cream 1 % Apply 1 application topically daily as needed for itching.    Marland Kitchen ibuprofen (ADVIL) 600 MG tablet Take 1 tablet (600 mg total) by mouth every 6 (six) hours as needed for mild pain or cramping. 30 tablet 0  . Misc Natural Products (OSTEO BI-FLEX/5-LOXIN ADVANCED PO) Take 1 capsule by mouth 2 (two) times daily.    . Multiple Vitamin (MULTIVITAMIN WITH MINERALS) TABS tablet Take 1 tablet by mouth daily.    . Probiotic Product ([place]) CAPS Take 1 capsule by mouth every evening.     . loratadine (CLARITIN) 10 MG tablet Take 1 tablet (10 mg total) by mouth daily. (Patient not taking: Reported on 03/29/2019)     No current  facility-administered medications for this visit.    PHYSICAL EXAMINATION: ECOG PERFORMANCE STATUS: 0 - Asymptomatic  BP 108/63   Pulse (!) 56   Temp (!) 97.3 F (36.3 C) (Tympanic)   Resp 20   There were no vitals filed for this visit. Physical Exam  Constitutional: She is oriented to person, place, and time and well-developed, well-nourished, and in no distress.  Alone.  Walking by self.  HENT:  Head: Normocephalic and atraumatic.  Mouth/Throat: Oropharynx is clear and moist. No oropharyngeal exudate.  Eyes: Pupils are equal, round, and reactive to light.  Cardiovascular: Normal rate and regular rhythm.  Pulmonary/Chest: No respiratory distress. She has no wheezes.  Abdominal: Soft. Bowel sounds are normal. She exhibits no distension and no mass. There is no abdominal tenderness. There is no rebound and no guarding.  Musculoskeletal:        General: No tenderness or edema. Normal range of motion.     Cervical back: Normal range of motion and neck supple.  Neurological: She is alert and oriented to person, place, and time.  Skin: Skin is warm.  Psychiatric: Affect normal.    LABORATORY DATA:  I have reviewed the data as listed    Component  Value Date/Time   NA 141 07/19/2019 1310   NA 143 10/13/2017 0920   NA 142 06/19/2011 2227   K 4.1 07/19/2019 1310   K 3.8 06/19/2011 2227   CL 105 07/19/2019 1310   CL 107 06/19/2011 2227   CO2 25 07/19/2019 1310   CO2 21 06/19/2011 2227   GLUCOSE 91 07/19/2019 1310   GLUCOSE 103 (H) 06/19/2011 2227   BUN 16 07/19/2019 1310   BUN 13 10/13/2017 0920   BUN 10 06/19/2011 2227   CREATININE 0.59 07/19/2019 1310   CREATININE 0.75 06/19/2011 2227   CALCIUM 9.6 07/19/2019 1310   CALCIUM 9.5 06/19/2011 2227   PROT 6.9 01/03/2019 0936   PROT 6.5 10/13/2017 0920   ALBUMIN 4.6 01/03/2019 0936   ALBUMIN 4.6 10/13/2017 0920   AST 26 01/03/2019 0936   ALT 21 01/03/2019 0936   ALKPHOS 33 (L) 01/03/2019 0936   BILITOT 0.7 01/03/2019  0936   BILITOT 0.4 10/13/2017 0920   GFRNONAA >60 07/19/2019 1310   GFRNONAA >60 06/19/2011 2227   GFRAA >60 07/19/2019 1310   GFRAA >60 06/19/2011 2227    No results found for: SPEP, UPEP  Lab Results  Component Value Date   WBC 3.4 (L) 01/25/2019   NEUTROABS 1.9 01/25/2019   HGB 11.9 (L) 01/25/2019   HCT 35.9 (L) 01/25/2019   MCV 102.0 (H) 01/25/2019   PLT 184 01/25/2019      Chemistry      Component Value Date/Time   NA 141 07/19/2019 1310   NA 143 10/13/2017 0920   NA 142 06/19/2011 2227   K 4.1 07/19/2019 1310   K 3.8 06/19/2011 2227   CL 105 07/19/2019 1310   CL 107 06/19/2011 2227   CO2 25 07/19/2019 1310   CO2 21 06/19/2011 2227   BUN 16 07/19/2019 1310   BUN 13 10/13/2017 0920   BUN 10 06/19/2011 2227   CREATININE 0.59 07/19/2019 1310   CREATININE 0.75 06/19/2011 2227      Component Value Date/Time   CALCIUM 9.6 07/19/2019 1310   CALCIUM 9.5 06/19/2011 2227   ALKPHOS 33 (L) 01/03/2019 0936   AST 26 01/03/2019 0936   ALT 21 01/03/2019 0936   BILITOT 0.7 01/03/2019 0936   BILITOT 0.4 10/13/2017 0920       RADIOGRAPHIC STUDIES: I have personally reviewed the radiological images as listed and agreed with the findings in the report. No results found.   ASSESSMENT & PLAN:  Malignant neoplasm of upper-outer quadrant of right breast in female, estrogen receptor positive ([place]) #Stage I ER PR positive HER-2 negative [HIGH risk mamma print]- on Arimidex + BSO.  STABLE;   No evidence of recurrence.  # Continue adjuvant Zometa every 6 months [07/2019]; again next in 4 months.   # Osteoarthritis-  G-1; on Osteo Bi-Flex; on volaten gel.   # Hot flashes-grade 1- Stable.   # BMD- June 2020- T score-Normal; on ca+vit D  # Peripheral neuropathy- G-1; STABLE  # DISPOSITION:  #  bil mammogram June 2021.  # follow up in 4 months; MD; labs- cbc/cmp/Zometa;  -Dr.B   Orders Placed This Encounter  Procedures  . CBC with Differential    Standing Status:    Future    Standing Expiration Date:   09/26/2020  . Comprehensive metabolic panel    Standing Status:   Future    Standing Expiration Date:   09/26/2020   All questions were answered. The patient knows to call the clinic with  any problems, questions or concerns.      Cammie Sickle, MD 10/06/2019 5:19 PM

## 2019-11-07 ENCOUNTER — Encounter: Payer: Self-pay | Admitting: Radiation Oncology

## 2019-11-07 ENCOUNTER — Ambulatory Visit
Admission: RE | Admit: 2019-11-07 | Discharge: 2019-11-07 | Disposition: A | Discharge status: Home / Self Care | Payer: Managed Care, Other (non HMO) | Source: Ambulatory Visit | Attending: Radiation Oncology | Admitting: Radiation Oncology

## 2019-11-07 ENCOUNTER — Other Ambulatory Visit: Payer: Self-pay

## 2019-11-07 VITALS — BP 119/68 | HR 54 | Temp 97.6°F | Resp 16 | Wt 148.3 lb

## 2019-11-07 DIAGNOSIS — Z923 Personal history of irradiation: Secondary | ICD-10-CM | POA: Diagnosis not present

## 2019-11-07 DIAGNOSIS — Z170 Estrogen receptor positive status [ER+]: Secondary | ICD-10-CM

## 2019-11-07 DIAGNOSIS — Z79811 Long term (current) use of aromatase inhibitors: Secondary | ICD-10-CM | POA: Diagnosis not present

## 2019-11-07 DIAGNOSIS — C50411 Malignant neoplasm of upper-outer quadrant of right female breast: Secondary | ICD-10-CM | POA: Diagnosis present

## 2019-11-07 NOTE — Progress Notes (Signed)
Radiation Oncology Follow up Note  Name: Crystal Branch   Date:   11/07/2019 MRN:  [id number] DOB: [birth year]    This 49 y.o. female presents to the clinic today for 1 year follow-up status whole breast radiation to right breast for stage I ER/PR positive invasive mammary carcinoma.  REFERRING PROVIDER: Cletis Athens, MD  HPI: Patient is a 49 year old female now at 1 year having completed whole breast radiation to her right breast for stage I ER/PR positive invasive mammary carcinoma.  Patient seen today in routine follow-up is doing well.  She specifically denies breast tenderness cough or bone pain.  Recently developed some slight shooting pains in the right breast consistent with nerve recovery from her prior surgery.  Patient is currently on.  Arimidex tolerating it well without side effect.  She had mammograms a year ago which I have reviewed were BI-RADS 2 benign.  She also has mammograms later this month which I will review when the become available.  COMPLICATIONS OF TREATMENT: none  FOLLOW UP COMPLIANCE: keeps appointments   PHYSICAL EXAM:  BP 119/68 (BP Location: Left Arm, Patient Position: Sitting, Cuff Size: Normal)   Pulse (!) 54   Temp 97.6 F (36.4 C) (Tympanic)   Resp 16   Wt 148 lb 4.8 oz (67.3 kg)   BMI 25.46 kg/m  Lungs are clear to A&P cardiac examination essentially unremarkable with regular rate and rhythm. No dominant mass or nodularity is noted in either breast in 2 positions examined. Incision is well-healed. No axillary or supraclavicular adenopathy is appreciated. Cosmetic result is excellent.  Well-developed well-nourished patient in NAD. HEENT reveals PERLA, EOMI, discs not visualized.  Oral cavity is clear. No oral mucosal lesions are identified. Neck is clear without evidence of cervical or supraclavicular adenopathy. Lungs are clear to A&P. Cardiac examination is essentially unremarkable with regular rate and rhythm without murmur rub or thrill. Abdomen is benign  with no organomegaly or masses noted. Motor sensory and DTR levels are equal and symmetric in the upper and lower extremities. Cranial nerves II through XII are grossly intact. Proprioception is intact. No peripheral adenopathy or edema is identified. No motor or sensory levels are noted. Crude visual fields are within normal range.  RADIOLOGY RESULTS: Mammograms reviewed compatible with above-stated findings  PLAN: Present time patient is doing well 1 year out with no evidence of disease.  She continues on Arimidex without side effect.  She will have mammograms later this month which I will review.  I have asked to see the patient back in 1 year for follow-up.  Patient knows to call with any concerns.  I would like to take this opportunity to thank you for allowing me to participate in the care of your patient.Noreene Filbert, MD

## 2019-11-14 ENCOUNTER — Ambulatory Visit
Admission: RE | Admit: 2019-11-14 | Discharge: 2019-11-14 | Disposition: A | Discharge status: Home / Self Care | Payer: Managed Care, Other (non HMO) | Source: Ambulatory Visit | Attending: General Surgery | Admitting: General Surgery

## 2019-11-14 DIAGNOSIS — Z170 Estrogen receptor positive status [ER+]: Secondary | ICD-10-CM

## 2019-11-14 DIAGNOSIS — C50411 Malignant neoplasm of upper-outer quadrant of right female breast: Secondary | ICD-10-CM | POA: Diagnosis present

## 2019-11-30 ENCOUNTER — Other Ambulatory Visit: Payer: Self-pay | Admitting: *Deleted

## 2019-11-30 MED ORDER — ERGOCALCIFEROL 1.25 MG (50000 UT) PO CAPS: 50000.0000 [IU] | ORAL_CAPSULE | ORAL | 1 refills | Status: DC

## 2020-01-24 ENCOUNTER — Inpatient Hospital Stay: Payer: Managed Care, Other (non HMO)

## 2020-01-24 ENCOUNTER — Inpatient Hospital Stay: Payer: Managed Care, Other (non HMO) | Attending: Internal Medicine

## 2020-01-24 ENCOUNTER — Other Ambulatory Visit: Payer: Self-pay

## 2020-01-24 ENCOUNTER — Encounter: Payer: Self-pay | Admitting: Internal Medicine

## 2020-01-24 ENCOUNTER — Inpatient Hospital Stay: Payer: Managed Care, Other (non HMO) | Admitting: Internal Medicine

## 2020-01-24 DIAGNOSIS — C50411 Malignant neoplasm of upper-outer quadrant of right female breast: Secondary | ICD-10-CM

## 2020-01-24 DIAGNOSIS — M2550 Pain in unspecified joint: Secondary | ICD-10-CM | POA: Insufficient documentation

## 2020-01-24 DIAGNOSIS — Z79899 Other long term (current) drug therapy: Secondary | ICD-10-CM | POA: Diagnosis not present

## 2020-01-24 DIAGNOSIS — Z90722 Acquired absence of ovaries, bilateral: Secondary | ICD-10-CM | POA: Insufficient documentation

## 2020-01-24 DIAGNOSIS — R5383 Other fatigue: Secondary | ICD-10-CM | POA: Diagnosis not present

## 2020-01-24 DIAGNOSIS — Z923 Personal history of irradiation: Secondary | ICD-10-CM | POA: Insufficient documentation

## 2020-01-24 DIAGNOSIS — R232 Flushing: Secondary | ICD-10-CM | POA: Diagnosis not present

## 2020-01-24 DIAGNOSIS — Z800 Family history of malignant neoplasm of digestive organs: Secondary | ICD-10-CM | POA: Insufficient documentation

## 2020-01-24 DIAGNOSIS — M25532 Pain in left wrist: Secondary | ICD-10-CM | POA: Insufficient documentation

## 2020-01-24 DIAGNOSIS — Z170 Estrogen receptor positive status [ER+]: Secondary | ICD-10-CM

## 2020-01-24 DIAGNOSIS — G629 Polyneuropathy, unspecified: Secondary | ICD-10-CM | POA: Diagnosis not present

## 2020-01-24 DIAGNOSIS — M1990 Unspecified osteoarthritis, unspecified site: Secondary | ICD-10-CM | POA: Diagnosis not present

## 2020-01-24 DIAGNOSIS — Z803 Family history of malignant neoplasm of breast: Secondary | ICD-10-CM | POA: Diagnosis not present

## 2020-01-24 DIAGNOSIS — Z9221 Personal history of antineoplastic chemotherapy: Secondary | ICD-10-CM | POA: Diagnosis not present

## 2020-01-24 LAB — COMPREHENSIVE METABOLIC PANEL
ALT: 21 U/L (ref 0–44)
AST: 25 U/L (ref 15–41)
Albumin: 4.3 g/dL (ref 3.5–5.0)
Alkaline Phosphatase: 46 U/L (ref 38–126)
Anion gap: 6 (ref 5–15)
BUN: 12 mg/dL (ref 6–20)
CO2: 30 mmol/L (ref 22–32)
Calcium: 8.8 mg/dL — ABNORMAL LOW (ref 8.9–10.3)
Chloride: 105 mmol/L (ref 98–111)
Creatinine, Ser: 0.81 mg/dL (ref 0.44–1.00)
GFR calc Af Amer: >60 mL/min (ref >60)
GFR calc non Af Amer: >60 mL/min (ref >60)
Glucose, Bld: 115 mg/dL — ABNORMAL HIGH (ref 70–99)
Potassium: 4 mmol/L (ref 3.5–5.1)
Sodium: 141 mmol/L (ref 135–145)
Total Bilirubin: 0.6 mg/dL (ref 0.3–1.2)
Total Protein: 6.4 g/dL — ABNORMAL LOW (ref 6.5–8.1)

## 2020-01-24 LAB — CBC WITH DIFFERENTIAL/PLATELET
Abs Immature Granulocytes: 0.01 10*3/uL (ref 0.00–0.07)
Basophils Absolute: 0 10*3/uL (ref 0.0–0.1)
Basophils Relative: 1 %
Eosinophils Absolute: 0 10*3/uL (ref 0.0–0.5)
Eosinophils Relative: 1 %
HCT: 34.8 % — ABNORMAL LOW (ref 36.0–46.0)
Hemoglobin: 11.9 g/dL — ABNORMAL LOW (ref 12.0–15.0)
Immature Granulocytes: 0 %
Lymphocytes Relative: 32 %
Lymphs Abs: 1.1 10*3/uL (ref 0.7–4.0)
MCH: 33.2 pg (ref 26.0–34.0)
MCHC: 34.2 g/dL (ref 30.0–36.0)
MCV: 97.2 fL (ref 80.0–100.0)
Monocytes Absolute: 0.3 10*3/uL (ref 0.1–1.0)
Monocytes Relative: 7 %
Neutro Abs: 2.1 10*3/uL (ref 1.7–7.7)
Neutrophils Relative %: 59 %
Platelets: 209 10*3/uL (ref 150–400)
RBC: 3.58 MIL/uL — ABNORMAL LOW (ref 3.87–5.11)
RDW: 12 % (ref 11.5–15.5)
WBC: 3.5 10*3/uL — ABNORMAL LOW (ref 4.0–10.5)
nRBC: 0 % (ref 0.0–0.2)

## 2020-01-24 MED ORDER — ZOLEDRONIC ACID 4 MG/100ML IV SOLN
4.0000 mg | Freq: Once | INTRAVENOUS | Status: AC
  Administered 2020-01-24: 4 mg via INTRAVENOUS
  Filled 2020-01-24: qty 100

## 2020-01-24 MED ORDER — SODIUM CHLORIDE 0.9 % IV SOLN
Freq: Once | INTRAVENOUS | Status: AC
  Filled 2020-01-24: qty 250

## 2020-01-24 NOTE — Progress Notes (Signed)
[facility] OFFICE PROGRESS NOTE  Patient Care Team: Cletis Athens, MD as PCP - General (Internal Medicine) Bary Castilla, Forest Gleason, MD as Surgeon (General Surgery) Rico Junker, RN as Oncology Nurse Navigator Noreene Filbert, MD as Referring Physician (Radiation Oncology) Cammie Sickle, MD as Consulting Physician (Internal Medicine) Lequita Asal, MD as Referring Physician (Hematology and Oncology)  Cancer Staging No matching staging information was found for the patient.   Oncology History Overview Note  # May 2019- RIGHT BREAST CA s/p Lumpec & SLNBx [pT1c pN0 (sn); G-1;  margins clear; ER/PR > 90%; Her 2 neu- FISH-NEG. MAMMAPRINT [Dr.Byrnett]- HIGH RISK  # July 29th TC x4 [finished 02/15/2018] s/p RT [dec 11th 2019]; # jan 13th 2020- start Tam; STOPPED in SEP 2020- sec to tolerance.  #Nov 2020-Arimidex [s/p BSO]  # March 9th 2020- Triptorelinq 4q; starting June 2020- switch to q12w; August 2020-BSO; discontinue triptorelin.   # march 9th 2020- Zometa 4 mg/ adjuvant   GENETIC TESTING: [Dr.Byrnett] NEGATIVE FOR - ATM/ BRCA2/CHEK2/PTEN/TP53/BRCA1/CDH1/PALB2/ STK11   DIAGNOSIS: BREAST CA   STAGE:  I/mammaprint-H ;GOALS: cure  CURRENT/MOST RECENT THERAPY: Arimidex    Malignant neoplasm of upper-outer quadrant of right breast in female, estrogen receptor positive ([place])  11/03/2017 Initial Diagnosis   Malignant neoplasm of upper-outer quadrant of right breast in female, estrogen receptor positive ([place])       INTERVAL HISTORY:  Crystal Branch 49 y.o.  female pleasant patient above history of stage I breast cancer ER PR positive HER-2/neu negative-BSO plus aromatase inhibitor is here for follow-up.  Patient continues to have mild to moderate joint pains especially left wrist.  Appetite is good.  No weight loss.  No new lumps or bumps.  Mild hot flashes.   Review of Systems  Constitutional: Positive for malaise/fatigue. Negative for chills,  diaphoresis, fever and weight loss.  HENT: Negative for nosebleeds and sore throat.   Eyes: Negative for double vision.  Respiratory: Negative for cough, hemoptysis, sputum production, shortness of breath and wheezing.   Cardiovascular: Negative for chest pain, palpitations, orthopnea and leg swelling.  Gastrointestinal: Negative for abdominal pain, blood in stool, constipation, heartburn, melena, nausea and vomiting.  Genitourinary: Negative for dysuria, frequency and urgency.  Musculoskeletal: Positive for joint pain. Negative for back pain.  Skin: Negative for itching.  Neurological: Positive for tingling. Negative for dizziness, focal weakness, weakness and headaches.  Endo/Heme/Allergies: Does not bruise/bleed easily.  Psychiatric/Behavioral: Negative for depression. The patient is not nervous/anxious and does not have insomnia.       PAST MEDICAL HISTORY :  Past Medical History:  Diagnosis Date  . BRCA negative 09/2016   [facility]  . Breast CA ([place]) 09/30/2017   INVASIVE MAMMARY CARCINOMA WITH MUCINOUS FEATURES/ ER/PR 90%; Her 2 neu: Negative.  Mammoprint: High risk.   Marland Kitchen Dysrhythmia    atrial tach/ 2013  . Personal history of chemotherapy   . Personal history of radiation therapy     PAST SURGICAL HISTORY :   Past Surgical History:  Procedure Laterality Date  . 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 BREAST LUMPECTOMY Right 10/26/2017  . BREAST LUMPECTOMY WITH SENTINEL LYMPH NODE BIOPSY Right 10/26/2017   Procedure: BREAST LUMPECTOMY WITH SENTINEL LYMPH NODE BX;  Surgeon: Robert Bellow, MD;  Location: [facility] ORS;  Service: General;  Laterality: Right;  . COLONOSCOPY WITH PROPOFOL N/A 12/01/2018   Procedure: COLONOSCOPY WITH PROPOFOL;  Surgeon: Bary Castilla,  Forest Gleason, MD;  Location: [facility] END[facility]COPY;  Service: Endoscopy;  Laterality: N/A;  . LAPAROSCOPIC BILATERAL SALPINGO OOPHERECTOMY N/A 01/06/2019    Procedure: LAPAROSCOPIC BILATERAL SALPINGO OOPHORECTOMY;  Surgeon: Will Bonnet, MD;  Location: [facility] ORS;  Service: Gynecology;  Laterality: N/A;  . TONSILLECTOMY  2015    FAMILY HISTORY :   Family History  Problem Relation Age of Onset  . Breast cancer Maternal Aunt 60  . Breast cancer Maternal Grandmother 44  . Colon cancer Mother 22    SOCIAL HISTORY:   Social History   Tobacco Use  . Smoking status: Never Smoker  . Smokeless tobacco: Never Used  Vaping Use  . Vaping Use: Never used  Substance Use Topics  . Alcohol use: Never  . Drug use: Never    ALLERGIES:  has No Known Allergies.  MEDICATIONS:  Current Outpatient Medications  Medication Sig Dispense Refill  . acetaminophen (TYLENOL) 650 MG CR tablet Take 1 tablet by mouth 3 (three) times daily. Prn pain    . anastrozole (ARIMIDEX) 1 MG tablet TAKE 1 TABLET BY MOUTH  DAILY 90 tablet 3  . Calcium Carb-Cholecalciferol (CALCIUM-VITAMIN D3) 600-500 MG-UNIT CAPS Take 1 capsule by mouth 2 (two) times daily.    . diclofenac Sodium (VOLTAREN) 1 % GEL Apply 2 g topically 4 (four) times daily.    . ergocalciferol (VITAMIN D2) 1.25 MG (50000 UT) capsule Take 1 capsule (50,000 Units total) by mouth once a week. 12 capsule 1  . Misc Natural Products (OSTEO BI-FLEX/5-LOXIN ADVANCED PO) Take 1 capsule by mouth 2 (two) times daily.    . Multiple Vitamin (MULTIVITAMIN WITH MINERALS) TABS tablet Take 1 tablet by mouth daily.    . Probiotic Product ([place]) CAPS Take 1 capsule by mouth every evening.      No current facility-administered medications for this visit.    PHYSICAL EXAMINATION: ECOG PERFORMANCE STATUS: 0 - Asymptomatic  BP 103/64 (BP Location: Left Arm, Patient Position: Sitting, Cuff Size: Normal)   Pulse 64   Temp 99.2 F (37.3 C) (Tympanic)   Resp 14   Ht 5' 4"  (1.626 m)   Wt 148 lb 14.4 oz (67.5 kg)   SpO2 99%   BMI 25.56 kg/m   Filed Weights   01/24/20 1300  Weight: 148 lb 14.4 oz  (67.5 kg)   Physical Exam Constitutional:      Comments: Alone.  Walking by self.  HENT:     Head: Normocephalic and atraumatic.     Mouth/Throat:     Pharynx: No oropharyngeal exudate.  Eyes:     Pupils: Pupils are equal, round, and reactive to light.  Cardiovascular:     Rate and Rhythm: Normal rate and regular rhythm.  Pulmonary:     Effort: No respiratory distress.     Breath sounds: No wheezing.  Abdominal:     General: Bowel sounds are normal. There is no distension.     Palpations: Abdomen is soft. There is no mass.     Tenderness: There is no abdominal tenderness. There is no guarding or rebound.  Musculoskeletal:        General: No tenderness. Normal range of motion.     Cervical back: Normal range of motion and neck supple.  Skin:    General: Skin is warm.  Neurological:     Mental Status: She is alert and oriented to person, place, and time.  Psychiatric:        Mood and Affect: Affect normal.  LABORATORY DATA:  I have reviewed the data as listed    Component Value Date/Time   NA 141 01/24/2020 1247   NA 143 10/13/2017 0920   NA 142 06/19/2011 2227   K 4.0 01/24/2020 1247   K 3.8 06/19/2011 2227   CL 105 01/24/2020 1247   CL 107 06/19/2011 2227   CO2 30 01/24/2020 1247   CO2 21 06/19/2011 2227   GLUCOSE 115 (H) 01/24/2020 1247   GLUCOSE 103 (H) 06/19/2011 2227   BUN 12 01/24/2020 1247   BUN 13 10/13/2017 0920   BUN 10 06/19/2011 2227   CREATININE 0.81 01/24/2020 1247   CREATININE 0.75 06/19/2011 2227   CALCIUM 8.8 (L) 01/24/2020 1247   CALCIUM 9.5 06/19/2011 2227   PROT 6.4 (L) 01/24/2020 1247   PROT 6.5 10/13/2017 0920   ALBUMIN 4.3 01/24/2020 1247   ALBUMIN 4.6 10/13/2017 0920   AST 25 01/24/2020 1247   ALT 21 01/24/2020 1247   ALKPHOS 46 01/24/2020 1247   BILITOT 0.6 01/24/2020 1247   BILITOT 0.4 10/13/2017 0920   GFRNONAA >60 01/24/2020 1247   GFRNONAA >60 06/19/2011 2227   GFRAA >60 01/24/2020 1247   GFRAA >60 06/19/2011 2227     No results found for: SPEP, UPEP  Lab Results  Component Value Date   WBC 3.5 (L) 01/24/2020   NEUTROABS 2.1 01/24/2020   HGB 11.9 (L) 01/24/2020   HCT 34.8 (L) 01/24/2020   MCV 97.2 01/24/2020   PLT 209 01/24/2020      Chemistry      Component Value Date/Time   NA 141 01/24/2020 1247   NA 143 10/13/2017 0920   NA 142 06/19/2011 2227   K 4.0 01/24/2020 1247   K 3.8 06/19/2011 2227   CL 105 01/24/2020 1247   CL 107 06/19/2011 2227   CO2 30 01/24/2020 1247   CO2 21 06/19/2011 2227   BUN 12 01/24/2020 1247   BUN 13 10/13/2017 0920   BUN 10 06/19/2011 2227   CREATININE 0.81 01/24/2020 1247   CREATININE 0.75 06/19/2011 2227      Component Value Date/Time   CALCIUM 8.8 (L) 01/24/2020 1247   CALCIUM 9.5 06/19/2011 2227   ALKPHOS 46 01/24/2020 1247   AST 25 01/24/2020 1247   ALT 21 01/24/2020 1247   BILITOT 0.6 01/24/2020 1247   BILITOT 0.4 10/13/2017 0920       RADIOGRAPHIC STUDIES: I have personally reviewed the radiological images as listed and agreed with the findings in the report. No results found.   ASSESSMENT & PLAN:  Malignant neoplasm of upper-outer quadrant of right breast in female, estrogen receptor positive ([place]) #Stage I ER PR positive HER-2 negative [HIGH risk mamma print]- on Arimidex + BSO.  STABLE;    No evidence of recurrence. bil mammogram June 2021- WNL.   # Continue adjuvant Zometa every 6 months [last 07/19/2019]; proceed with zometa today [ca-8.8]continue arimidex.   # Osteoarthritis- STABLE;  G-1; on Osteo Bi-Flex; on volaten gel.   # Hot flashes-grade 1- STABLE.   # BMD- June 2020- T score-Normal; on ca+vit D; on Zometa q 6 M [adjuvant]  # Peripheral neuropathy- G-1; STABLE.   # DISPOSITION:  # follow up in 6 months; MD; labs- cbc/cmp/Zometa;  -Dr.B   No orders of the defined types were placed in this encounter.  All questions were answered. The patient knows to call the clinic with any problems, questions or concerns.       Cammie Sickle, MD 01/24/2020  4:34 PM

## 2020-01-24 NOTE — Assessment & Plan Note (Signed)
#  Stage I ER PR positive HER-2 negative [HIGH risk mamma print]- on Arimidex + BSO.  STABLE;    No evidence of recurrence. bil mammogram June 2021- WNL.   # Continue adjuvant Zometa every 6 months [last 07/19/2019]; proceed with zometa today [ca-8.8]continue arimidex.   # Osteoarthritis- STABLE;  G-1; on Osteo Bi-Flex; on volaten gel.   # Hot flashes-grade 1- STABLE.   # BMD- June 2020- T score-Normal; on ca+vit D; on Zometa q 6 M [adjuvant]  # Peripheral neuropathy- G-1; STABLE.   # DISPOSITION:  # follow up in 6 months; MD; labs- cbc/cmp/Zometa;  -Dr.B

## 2020-02-23 DIAGNOSIS — M170 Bilateral primary osteoarthritis of knee: Secondary | ICD-10-CM | POA: Insufficient documentation

## 2020-02-27 ENCOUNTER — Other Ambulatory Visit: Payer: Self-pay | Admitting: Internal Medicine

## 2020-06-08 IMAGING — MG DIGITAL DIAGNOSTIC BILATERAL MAMMOGRAM WITH TOMO AND CAD
9 series · 9 of 25 positions shown · non-contrast
Comparison: Previous exam(s).

CLINICAL DATA: History of treated right breast cancer, status post
lumpectomy, radiation and chemotherapy in 6421.

EXAM:
DIGITAL DIAGNOSTIC BILATERAL MAMMOGRAM WITH CAD AND TOMO

[R MLO]
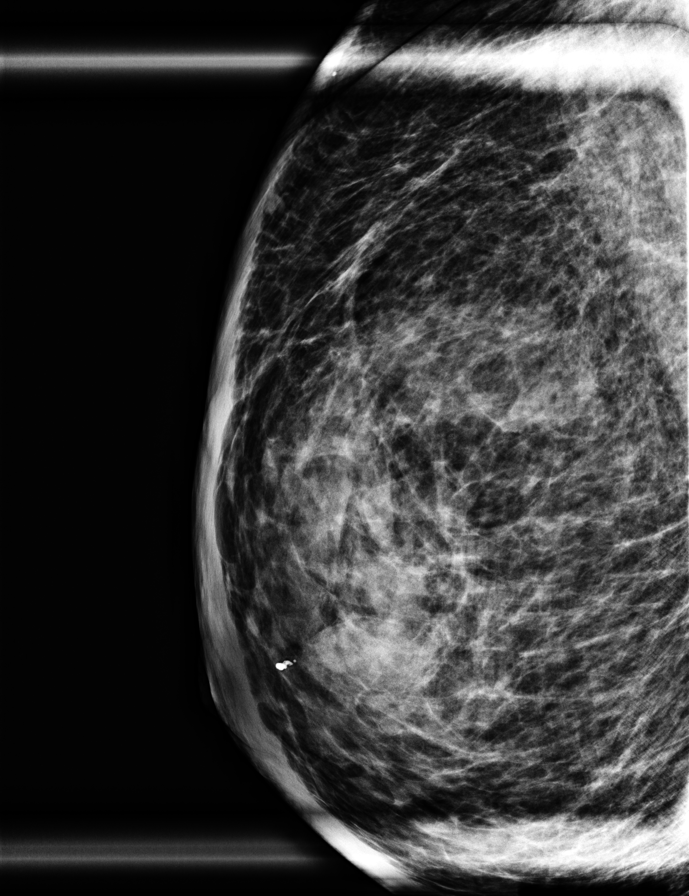

[R CC synth-2D]
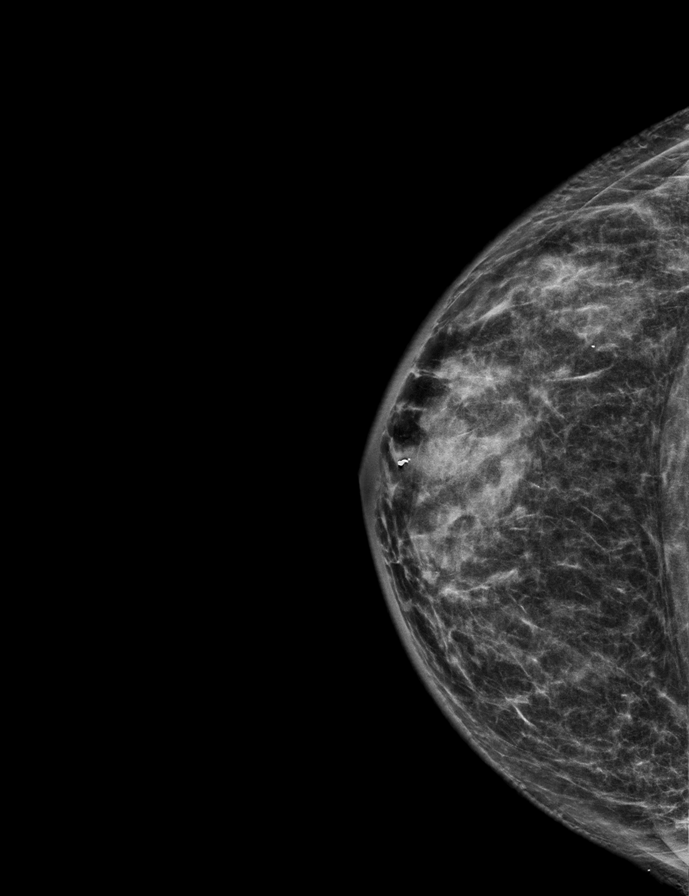

[R MLO synth-2D]
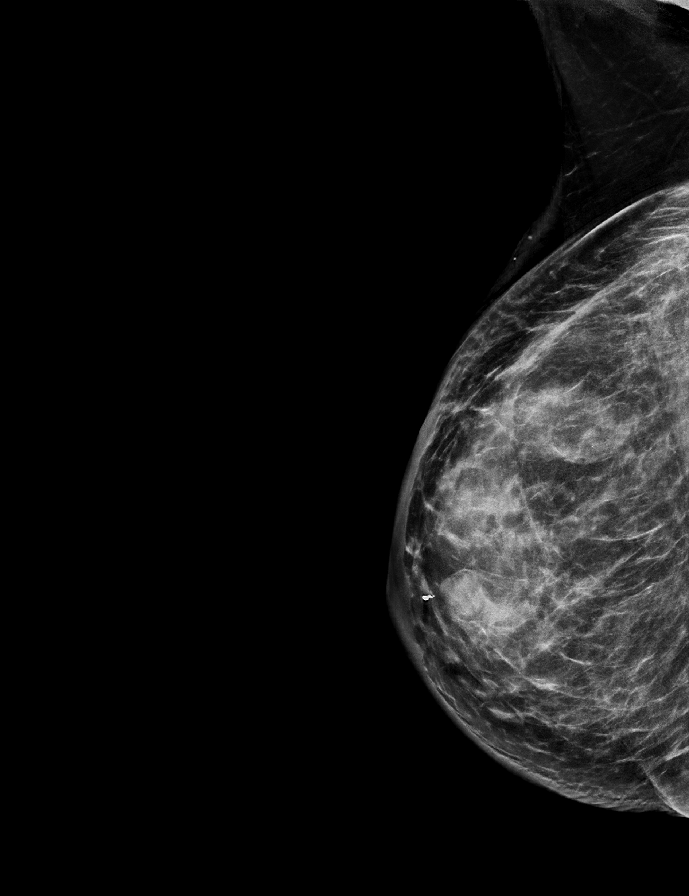

[L CC synth-2D]
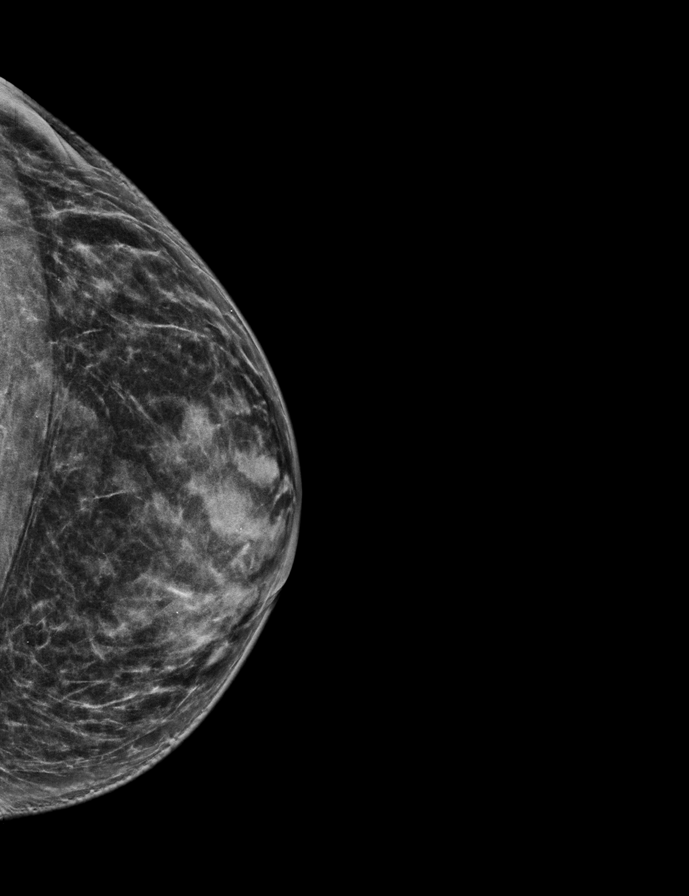

[L MLO synth-2D]
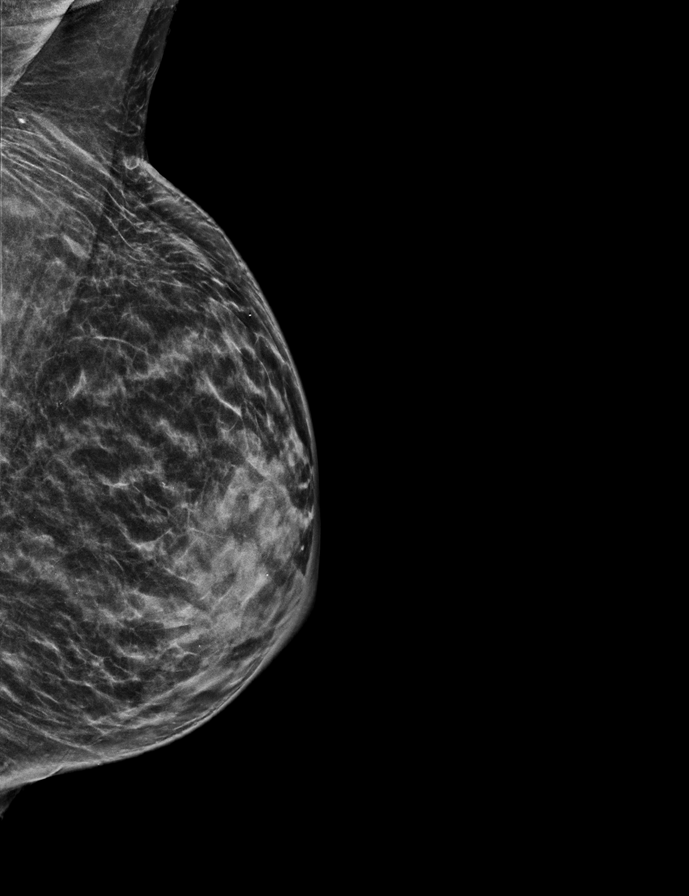

[R MLO tomo · tomo slice 39/76.0]
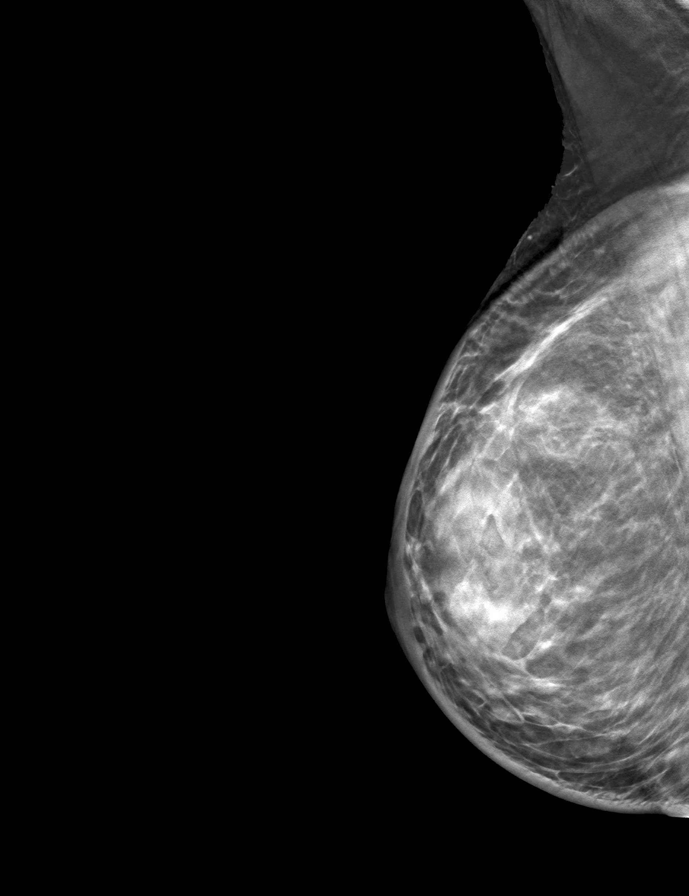

[L MLO tomo · tomo slice 25/50.0]
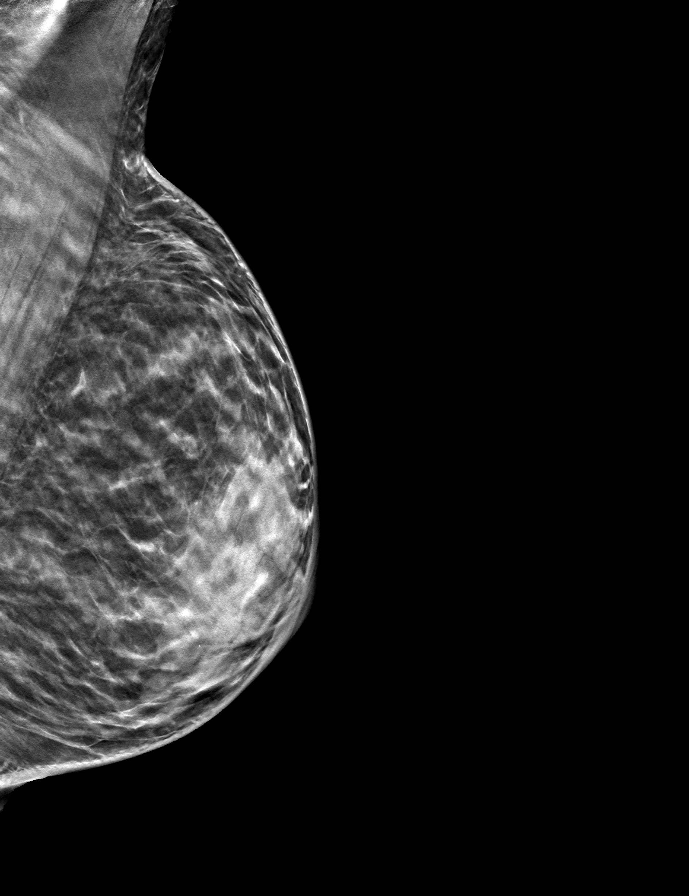

[L CC tomo · tomo slice 28/55.0]
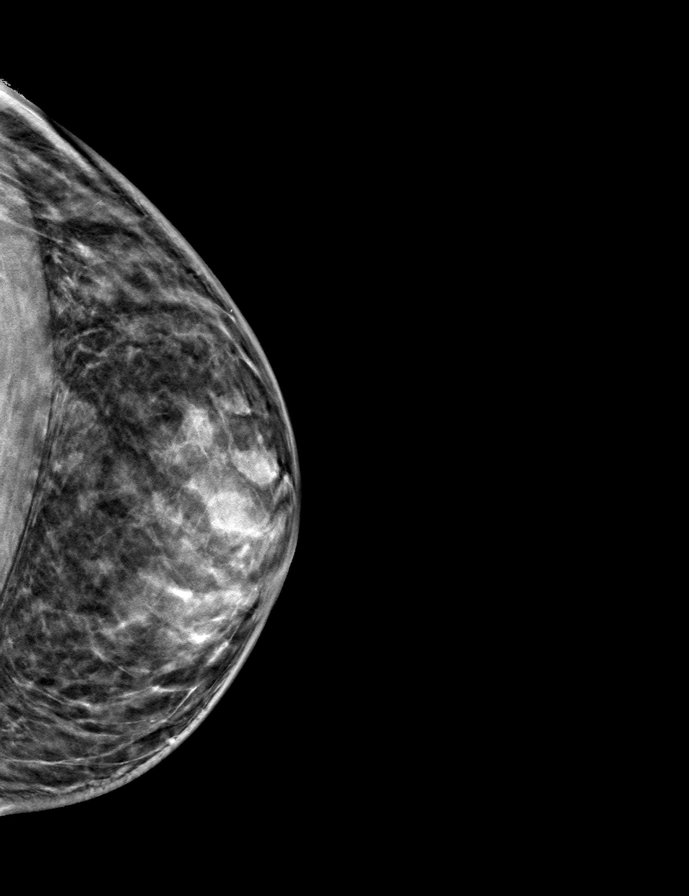

[R CC tomo · tomo slice 35/68.0]
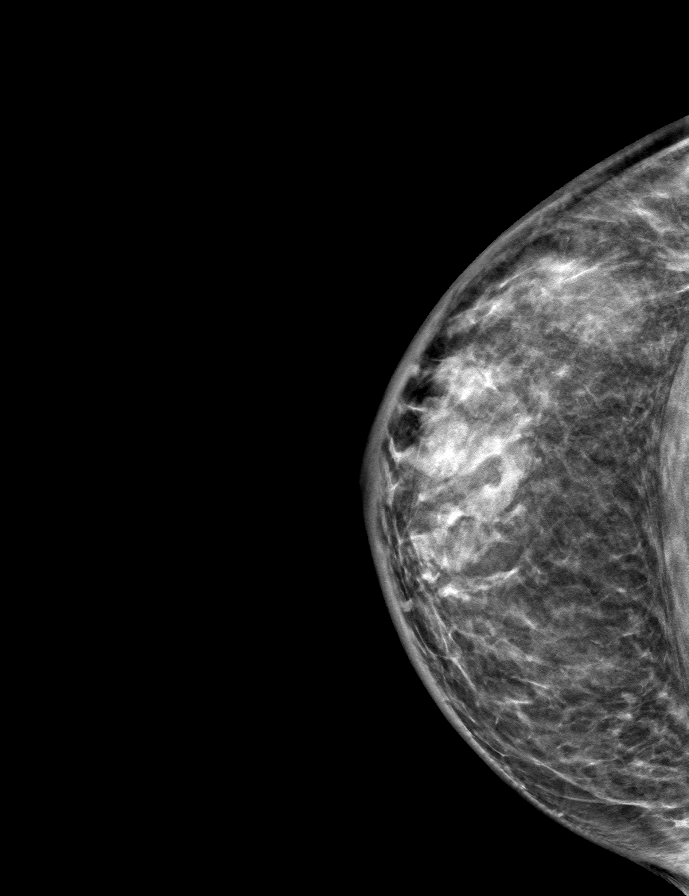

[9 of 25 positions shown; findings below may reference images not displayed]

ACR Breast Density Category c: The breast tissue is heterogeneously
dense, which may obscure small masses.
FINDINGS: Mammographically, there are no suspicious masses, areas of
nonsurgical architectural distortion or microcalcifications in
either breast. Expected posttreatment changes in the right breast.
Right breast edema, likely post radiation changes.

Mammographic images were processed with CAD.
IMPRESSION: No mammographic evidence of malignancy, status post right
lumpectomy.

RECOMMENDATION:
Diagnostic mammogram is suggested in 1 year. (Code:[id number])

I have discussed the findings and recommendations with the patient.
Results were also provided in writing at the conclusion of the
visit. If applicable, a reminder letter will be sent to the patient
regarding the next appointment.

BI-RADS CATEGORY  2: Benign.

## 2020-07-19 ENCOUNTER — Other Ambulatory Visit: Payer: Self-pay

## 2020-07-19 DIAGNOSIS — Z170 Estrogen receptor positive status [ER+]: Secondary | ICD-10-CM

## 2020-07-23 ENCOUNTER — Inpatient Hospital Stay: Payer: Managed Care, Other (non HMO)

## 2020-07-23 ENCOUNTER — Encounter: Payer: Self-pay | Admitting: Internal Medicine

## 2020-07-23 ENCOUNTER — Inpatient Hospital Stay: Payer: Managed Care, Other (non HMO) | Attending: Internal Medicine

## 2020-07-23 ENCOUNTER — Other Ambulatory Visit: Payer: Self-pay

## 2020-07-23 ENCOUNTER — Inpatient Hospital Stay: Payer: Managed Care, Other (non HMO) | Admitting: Internal Medicine

## 2020-07-23 VITALS — BP 118/68 | HR 58 | Temp 97.9°F | Resp 16 | Ht 64.0 in | Wt 147.0 lb

## 2020-07-23 DIAGNOSIS — C50411 Malignant neoplasm of upper-outer quadrant of right female breast: Secondary | ICD-10-CM

## 2020-07-23 DIAGNOSIS — Z79811 Long term (current) use of aromatase inhibitors: Secondary | ICD-10-CM | POA: Diagnosis not present

## 2020-07-23 DIAGNOSIS — Z170 Estrogen receptor positive status [ER+]: Secondary | ICD-10-CM | POA: Insufficient documentation

## 2020-07-23 DIAGNOSIS — M898X8 Other specified disorders of bone, other site: Secondary | ICD-10-CM

## 2020-07-23 LAB — COMPREHENSIVE METABOLIC PANEL
ALT: 20 U/L (ref 0–44)
AST: 24 U/L (ref 15–41)
Albumin: 4.5 g/dL (ref 3.5–5.0)
Alkaline Phosphatase: 48 U/L (ref 38–126)
Anion gap: 7 (ref 5–15)
BUN: 13 mg/dL (ref 6–20)
CO2: 30 mmol/L (ref 22–32)
Calcium: 9.3 mg/dL (ref 8.9–10.3)
Chloride: 105 mmol/L (ref 98–111)
Creatinine, Ser: 0.7 mg/dL (ref 0.44–1.00)
GFR, Estimated: >60 mL/min (ref >60)
Glucose, Bld: 87 mg/dL (ref 70–99)
Potassium: 4.2 mmol/L (ref 3.5–5.1)
Sodium: 142 mmol/L (ref 135–145)
Total Bilirubin: 0.7 mg/dL (ref 0.3–1.2)
Total Protein: 7 g/dL (ref 6.5–8.1)

## 2020-07-23 LAB — CBC WITH DIFFERENTIAL/PLATELET
Abs Immature Granulocytes: 0.01 10*3/uL (ref 0.00–0.07)
Basophils Absolute: 0 10*3/uL (ref 0.0–0.1)
Basophils Relative: 1 %
Eosinophils Absolute: 0 10*3/uL (ref 0.0–0.5)
Eosinophils Relative: 1 %
HCT: 37.1 % (ref 36.0–46.0)
Hemoglobin: 12.6 g/dL (ref 12.0–15.0)
Immature Granulocytes: 0 %
Lymphocytes Relative: 34 %
Lymphs Abs: 1.2 10*3/uL (ref 0.7–4.0)
MCH: 32.9 pg (ref 26.0–34.0)
MCHC: 34 g/dL (ref 30.0–36.0)
MCV: 96.9 fL (ref 80.0–100.0)
Monocytes Absolute: 0.3 10*3/uL (ref 0.1–1.0)
Monocytes Relative: 9 %
Neutro Abs: 1.9 10*3/uL (ref 1.7–7.7)
Neutrophils Relative %: 55 %
Platelets: 211 10*3/uL (ref 150–400)
RBC: 3.83 MIL/uL — ABNORMAL LOW (ref 3.87–5.11)
RDW: 11.8 % (ref 11.5–15.5)
WBC: 3.5 10*3/uL — ABNORMAL LOW (ref 4.0–10.5)
nRBC: 0 % (ref 0.0–0.2)

## 2020-07-23 MED ORDER — SODIUM CHLORIDE 0.9 % IV SOLN
Freq: Once | INTRAVENOUS | Status: AC
  Filled 2020-07-23: qty 250

## 2020-07-23 MED ORDER — ZOLEDRONIC ACID 4 MG/100ML IV SOLN
4.0000 mg | Freq: Once | INTRAVENOUS | Status: AC
  Administered 2020-07-23: 4 mg via INTRAVENOUS
  Filled 2020-07-23: qty 100

## 2020-07-23 NOTE — Assessment & Plan Note (Addendum)
#  Stage I ER PR positive HER-2 negative [HIGH risk mamma print]- on Arimidex + BSO.  Worsening hip pain/Left LE pain [see below].  Less likely recurrence; more likely secondary to anastrozole.  Hold anastrozole.  Bil mammogram June 2021- WNL.   # Worsening hip pain/Left LE pain- check bone scan- HOLD Anastrazole.   # Continue adjuvant Zometa every 6 months [last 07/19/2019]; proceed with zometa today [ca-8.8]continue arimidex.   # Osteoarthritis- STABLE;  G-1; on Osteo Bi-Flex; on volaten gel.   # Hot flashes-grade 1- STABLE.   # BMD- June 2020- T score-Normal; on ca+vit D; on Zometa q 6 M [adjuvant]  # Peripheral neuropathy- G-1; STABLE.   # DISPOSITION: [id number] cell- will call with results.  # zometa today-  # Bone scan ASAP # follow up TBD- Dr.B

## 2020-07-23 NOTE — Progress Notes (Signed)
[facility] OFFICE PROGRESS NOTE  Patient Care Team: Crystal Athens, MD as PCP - General (Internal Medicine) Crystal Branch, Crystal Gleason, MD as Surgeon (General Surgery) Crystal Junker, RN as Oncology Nurse Navigator Crystal Filbert, MD as Referring Physician (Radiation Oncology) Crystal Sickle, MD as Consulting Physician (Internal Medicine) Crystal Asal, MD as Referring Physician (Hematology and Oncology)  Cancer Staging No matching staging information was found for the patient.   Oncology History Overview Note  # May 2019- RIGHT BREAST CA s/p Lumpec & SLNBx [pT1c pN0 (sn); G-1;  margins clear; ER/PR > 90%; Her 2 neu- FISH-NEG. MAMMAPRINT [Dr.Byrnett]- HIGH RISK  # July 29th TC x4 [finished 02/15/2018] s/p RT [dec 11th 2019]; # jan 13th 2020- start Tam; STOPPED in SEP 2020- sec to tolerance.  #Nov 2020-Arimidex [s/p BSO]  # March 9th 2020- Triptorelinq 4q; starting June 2020- switch to q12w; August 2020-BSO; discontinue triptorelin.   # march 9th 2020- Zometa 4 mg/ adjuvant   GENETIC TESTING: [Dr.Byrnett] NEGATIVE FOR - ATM/ BRCA2/CHEK2/PTEN/TP53/BRCA1/CDH1/PALB2/ STK11   DIAGNOSIS: BREAST CA   STAGE:  I/mammaprint-H ;GOALS: cure  CURRENT/MOST RECENT THERAPY: Arimidex    Malignant neoplasm of upper-outer quadrant of right breast in female, estrogen receptor positive ([place])  11/03/2017 Initial Diagnosis   Malignant neoplasm of upper-outer quadrant of right breast in female, estrogen receptor positive ([place])       INTERVAL HISTORY:  Crystal Branch 50 y.o.  female pleasant patient above history of stage I breast cancer ER PR positive HER-2/neu negative-BSO plus aromatase inhibitor is here for follow-up.  Left hip; left femur; left tibia- last few months- 2-3 times/week. Tylenol as needed.   Patient continues to have pain in the small joints of her hands.  She continues take NSAIDs.  Otherwise appetite is good.  No weight loss.  Mild hot  flashes.   Review of Systems  Constitutional: Positive for malaise/fatigue. Negative for chills, diaphoresis, fever and weight loss.  HENT: Negative for nosebleeds and sore throat.   Eyes: Negative for double vision.  Respiratory: Negative for cough, hemoptysis, sputum production, shortness of breath and wheezing.   Cardiovascular: Negative for chest pain, palpitations, orthopnea and leg swelling.  Gastrointestinal: Negative for abdominal pain, blood in stool, constipation, heartburn, melena, nausea and vomiting.  Genitourinary: Negative for dysuria, frequency and urgency.  Musculoskeletal: Positive for joint pain. Negative for back pain.  Skin: Negative for itching.  Neurological: Positive for tingling. Negative for dizziness, focal weakness, weakness and headaches.  Endo/Heme/Allergies: Does not bruise/bleed easily.  Psychiatric/Behavioral: Negative for depression. The patient is not nervous/anxious and does not have insomnia.       PAST MEDICAL HISTORY :  Past Medical History:  Diagnosis Date  . BRCA negative 09/2016   [facility]  . Breast CA ([place]) 09/30/2017   INVASIVE MAMMARY CARCINOMA WITH MUCINOUS FEATURES/ ER/PR 90%; Her 2 neu: Negative.  Mammoprint: High risk.   Marland Kitchen Dysrhythmia    atrial tach/ 2013  . Personal history of chemotherapy   . Personal history of radiation therapy     PAST SURGICAL HISTORY :   Past Surgical History:  Procedure Laterality Date  . 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 BREAST LUMPECTOMY Right 10/26/2017  . BREAST LUMPECTOMY WITH SENTINEL LYMPH NODE BIOPSY Right 10/26/2017   Procedure: BREAST LUMPECTOMY WITH SENTINEL LYMPH NODE BX;  Surgeon: Robert Bellow, MD;  Location: [facility] ORS;  Service: General;  Laterality: Right;  .  COLONOSCOPY WITH PROPOFOL N/A 12/01/2018   Procedure: COLONOSCOPY WITH PROPOFOL;  Surgeon: Robert Bellow, MD;  Location: [facility] ENDOSCOPY;   Service: Endoscopy;  Laterality: N/A;  . LAPAROSCOPIC BILATERAL SALPINGO OOPHERECTOMY N/A 01/06/2019   Procedure: LAPAROSCOPIC BILATERAL SALPINGO OOPHORECTOMY;  Surgeon: Will Bonnet, MD;  Location: [facility] ORS;  Service: Gynecology;  Laterality: N/A;  . TONSILLECTOMY  2015    FAMILY HISTORY :   Family History  Problem Relation Age of Onset  . Breast cancer Maternal Aunt 60  . Breast cancer Maternal Grandmother 45  . Colon cancer Mother 75    SOCIAL HISTORY:   Social History   Tobacco Use  . Smoking status: Never Smoker  . Smokeless tobacco: Never Used  Vaping Use  . Vaping Use: Never used  Substance Use Topics  . Alcohol use: Never  . Drug use: Never    ALLERGIES:  has No Known Allergies.  MEDICATIONS:  Current Outpatient Medications  Medication Sig Dispense Refill  . acetaminophen (TYLENOL) 650 MG CR tablet Take 1 tablet by mouth 3 (three) times daily. Prn pain    . anastrozole (ARIMIDEX) 1 MG tablet TAKE 1 TABLET BY MOUTH  DAILY 90 tablet 3  . Calcium Carb-Cholecalciferol (CALCIUM-VITAMIN D3) 600-500 MG-UNIT CAPS Take 1 capsule by mouth 2 (two) times daily.    . diclofenac Sodium (VOLTAREN) 1 % GEL Apply 2 g topically 4 (four) times daily.    . Misc Natural Products (OSTEO BI-FLEX/5-LOXIN ADVANCED PO) Take 1 capsule by mouth 2 (two) times daily.    . Multiple Vitamin (MULTIVITAMIN WITH MINERALS) TABS tablet Take 1 tablet by mouth daily.    . Probiotic Product ([place]) CAPS Take 1 capsule by mouth every evening.     . Vitamin D, Ergocalciferol, (DRISDOL) 1.25 MG (50000 UNIT) CAPS capsule TAKE 1 CAPSULE BY MOUTH 1 TIME A WEEK 12 capsule 1   No current facility-administered medications for this visit.    PHYSICAL EXAMINATION: ECOG PERFORMANCE STATUS: 0 - Asymptomatic  BP 118/68 (BP Location: Left Arm, Patient Position: Sitting, Cuff Size: Normal)   Pulse (!) 58   Temp 97.9 F (36.6 C) (Tympanic)   Resp 16   Ht 5' 4"  (1.626 m)   Wt 147 lb (66.7 kg)    SpO2 100%   BMI 25.23 kg/m   Filed Weights   07/23/20 1308  Weight: 147 lb (66.7 kg)   Physical Exam Constitutional:      Comments: Alone.  Walking by self.  HENT:     Head: Normocephalic and atraumatic.     Mouth/Throat:     Pharynx: No oropharyngeal exudate.  Eyes:     Pupils: Pupils are equal, round, and reactive to light.  Cardiovascular:     Rate and Rhythm: Normal rate and regular rhythm.  Pulmonary:     Effort: No respiratory distress.     Breath sounds: No wheezing.  Abdominal:     General: Bowel sounds are normal. There is no distension.     Palpations: Abdomen is soft. There is no mass.     Tenderness: There is no abdominal tenderness. There is no guarding or rebound.  Musculoskeletal:        General: No tenderness. Normal range of motion.     Cervical back: Normal range of motion and neck supple.  Skin:    General: Skin is warm.  Neurological:     Mental Status: She is alert and oriented to person, place, and time.  Psychiatric:  Mood and Affect: Affect normal.     LABORATORY DATA:  I have reviewed the data as listed    Component Value Date/Time   NA 142 07/23/2020 1247   NA 143 10/13/2017 0920   NA 142 06/19/2011 2227   K 4.2 07/23/2020 1247   K 3.8 06/19/2011 2227   CL 105 07/23/2020 1247   CL 107 06/19/2011 2227   CO2 30 07/23/2020 1247   CO2 21 06/19/2011 2227   GLUCOSE 87 07/23/2020 1247   GLUCOSE 103 (H) 06/19/2011 2227   BUN 13 07/23/2020 1247   BUN 13 10/13/2017 0920   BUN 10 06/19/2011 2227   CREATININE 0.70 07/23/2020 1247   CREATININE 0.75 06/19/2011 2227   CALCIUM 9.3 07/23/2020 1247   CALCIUM 9.5 06/19/2011 2227   PROT 7.0 07/23/2020 1247   PROT 6.5 10/13/2017 0920   ALBUMIN 4.5 07/23/2020 1247   ALBUMIN 4.6 10/13/2017 0920   AST 24 07/23/2020 1247   ALT 20 07/23/2020 1247   ALKPHOS 48 07/23/2020 1247   BILITOT 0.7 07/23/2020 1247   BILITOT 0.4 10/13/2017 0920   GFRNONAA >60 07/23/2020 1247   GFRNONAA >60  06/19/2011 2227   GFRAA >60 01/24/2020 1247   GFRAA >60 06/19/2011 2227    No results found for: SPEP, UPEP  Lab Results  Component Value Date   WBC 3.5 (L) 07/23/2020   NEUTROABS 1.9 07/23/2020   HGB 12.6 07/23/2020   HCT 37.1 07/23/2020   MCV 96.9 07/23/2020   PLT 211 07/23/2020      Chemistry      Component Value Date/Time   NA 142 07/23/2020 1247   NA 143 10/13/2017 0920   NA 142 06/19/2011 2227   K 4.2 07/23/2020 1247   K 3.8 06/19/2011 2227   CL 105 07/23/2020 1247   CL 107 06/19/2011 2227   CO2 30 07/23/2020 1247   CO2 21 06/19/2011 2227   BUN 13 07/23/2020 1247   BUN 13 10/13/2017 0920   BUN 10 06/19/2011 2227   CREATININE 0.70 07/23/2020 1247   CREATININE 0.75 06/19/2011 2227      Component Value Date/Time   CALCIUM 9.3 07/23/2020 1247   CALCIUM 9.5 06/19/2011 2227   ALKPHOS 48 07/23/2020 1247   AST 24 07/23/2020 1247   ALT 20 07/23/2020 1247   BILITOT 0.7 07/23/2020 1247   BILITOT 0.4 10/13/2017 0920       RADIOGRAPHIC STUDIES: I have personally reviewed the radiological images as listed and agreed with the findings in the report. No results found.   ASSESSMENT & PLAN:  Malignant neoplasm of upper-outer quadrant of right breast in female, estrogen receptor positive ([place]) #Stage I ER PR positive HER-2 negative [HIGH risk mamma print]- on Arimidex + BSO.  Worsening hip pain/Left LE pain [see below].  Less likely recurrence; more likely secondary to anastrozole.  Hold anastrozole.  Bil mammogram June 2021- WNL.   # Worsening hip pain/Left LE pain- check bone scan- HOLD Anastrazole.   # Continue adjuvant Zometa every 6 months [last 07/19/2019]; proceed with zometa today [ca-8.8]continue arimidex.   # Osteoarthritis- STABLE;  G-1; on Osteo Bi-Flex; on volaten gel.   # Hot flashes-grade 1- STABLE.   # BMD- June 2020- T score-Normal; on ca+vit D; on Zometa q 6 M [adjuvant]  # Peripheral neuropathy- G-1; STABLE.   # DISPOSITION: [id number] cell-  will call with results.  # zometa today-  # Bone scan ASAP # follow up TBD- Dr.B    Orders Placed  This Encounter  Procedures  . NM Bone Scan Whole Body    Standing Status:   Future    Standing Expiration Date:   07/23/2021    Order Specific Question:   If indicated for the ordered procedure, I authorize the administration of a radiopharmaceutical per Radiology protocol    Answer:   Yes    Order Specific Question:   Is the patient pregnant?    Answer:   No    Order Specific Question:   Preferred imaging location?    Answer:   [facility]    Order Specific Question:   Radiology Contrast Protocol - do NOT remove file path    Answer:   [email or web address]   All questions were answered. The patient knows to call the clinic with any problems, questions or concerns.      Crystal Sickle, MD 07/24/2020 11:11 AM

## 2020-07-23 NOTE — Progress Notes (Signed)
Having pain in left upper leg and sometimes the left lower leg pain can get pretty severe at times. Started around Jan but has gotten worse within the last month. Happens once a week.

## 2020-07-23 NOTE — Patient Instructions (Signed)
#   STOP ANASTRAZOLE- until further directions.

## 2020-07-31 ENCOUNTER — Encounter
Admission: RE | Admit: 2020-07-31 | Discharge: 2020-07-31 | Disposition: A | Discharge status: Home / Self Care | Payer: Managed Care, Other (non HMO) | Source: Ambulatory Visit | Attending: Internal Medicine | Admitting: Internal Medicine

## 2020-07-31 ENCOUNTER — Other Ambulatory Visit: Payer: Self-pay

## 2020-07-31 ENCOUNTER — Ambulatory Visit
Admission: RE | Admit: 2020-07-31 | Discharge: 2020-07-31 | Disposition: A | Discharge status: Home / Self Care | Payer: Managed Care, Other (non HMO) | Source: Ambulatory Visit | Attending: Internal Medicine | Admitting: Internal Medicine

## 2020-07-31 DIAGNOSIS — Z170 Estrogen receptor positive status [ER+]: Secondary | ICD-10-CM | POA: Insufficient documentation

## 2020-07-31 DIAGNOSIS — M898X8 Other specified disorders of bone, other site: Secondary | ICD-10-CM | POA: Diagnosis present

## 2020-07-31 DIAGNOSIS — C50411 Malignant neoplasm of upper-outer quadrant of right female breast: Secondary | ICD-10-CM | POA: Insufficient documentation

## 2020-07-31 MED ORDER — TECHNETIUM TC 99M MEDRONATE IV KIT
22.2700 | PACK | Freq: Once | INTRAVENOUS | Status: AC | PRN
  Administered 2020-07-31: 22.27 via INTRAVENOUS

## 2020-08-02 ENCOUNTER — Telehealth: Payer: Self-pay | Admitting: Internal Medicine

## 2020-08-02 NOTE — Telephone Encounter (Signed)
Spoke with pt to schedule a f/u visit. Patient confirmed appt on 4/18 at 1pm.

## 2020-08-02 NOTE — Telephone Encounter (Signed)
Anderson Malta - please schedule patient's Follow-up on 4/18. [place] for [employer] visit - If patient elects.

## 2020-08-02 NOTE — Telephone Encounter (Signed)
On 3/16-spoke to patient regarding the results of the bone scan-negative for any metastatic disease suggestive of arthritis.  Recommend continuing to hold AI at this time.  Please schedule follow-up- MD; no labs- week of April 18th.   Thanks Dr.B

## 2020-08-28 ENCOUNTER — Other Ambulatory Visit: Payer: Self-pay | Admitting: Internal Medicine

## 2020-09-03 ENCOUNTER — Encounter: Payer: Self-pay | Admitting: Internal Medicine

## 2020-09-03 ENCOUNTER — Inpatient Hospital Stay: Payer: Managed Care, Other (non HMO) | Attending: Internal Medicine | Admitting: Internal Medicine

## 2020-09-03 ENCOUNTER — Other Ambulatory Visit: Payer: Self-pay

## 2020-09-03 DIAGNOSIS — M25559 Pain in unspecified hip: Secondary | ICD-10-CM | POA: Diagnosis not present

## 2020-09-03 DIAGNOSIS — C50411 Malignant neoplasm of upper-outer quadrant of right female breast: Secondary | ICD-10-CM | POA: Insufficient documentation

## 2020-09-03 DIAGNOSIS — N951 Menopausal and female climacteric states: Secondary | ICD-10-CM | POA: Insufficient documentation

## 2020-09-03 DIAGNOSIS — Z170 Estrogen receptor positive status [ER+]: Secondary | ICD-10-CM | POA: Diagnosis not present

## 2020-09-03 DIAGNOSIS — G620 Drug-induced polyneuropathy: Secondary | ICD-10-CM | POA: Diagnosis not present

## 2020-09-03 MED ORDER — EXEMESTANE 25 MG PO TABS: 25.0000 mg | ORAL_TABLET | Freq: Every day | ORAL | 4 refills | Status: DC

## 2020-09-03 NOTE — Progress Notes (Signed)
[facility] OFFICE PROGRESS NOTE  Patient Care Team: Cletis Athens, MD as PCP - General (Internal Medicine) Bary Castilla, Forest Gleason, MD as Surgeon (General Surgery) Rico Junker, RN as Oncology Nurse Navigator Noreene Filbert, MD as Referring Physician (Radiation Oncology) Cammie Sickle, MD as Consulting Physician (Internal Medicine) Lequita Asal, MD as Referring Physician (Hematology and Oncology)  Cancer Staging No matching staging information was found for the patient.   Oncology History Overview Note  # May 2019- RIGHT BREAST CA s/p Lumpec & SLNBx [pT1c pN0 (sn); G-1;  margins clear; ER/PR > 90%; Her 2 neu- FISH-NEG. MAMMAPRINT [Dr.Byrnett]- HIGH RISK  # July 29th TC x4 [finished 02/15/2018] s/p RT [dec 11th 2019]; # jan 13th 2020- start Tam; STOPPED in SEP 2020- sec to tolerance.  #Nov 2020-Arimidex [s/p BSO]; STOPPED MARCH 2022- sec to MSK symptoms; MID April 2022- Aromasin 25 mg/day.   # March 9th 2020- Triptorelinq 4q; starting June 2020- switch to q12w; August 2020-BSO; discontinue triptorelin.   # march 9th 2020- Zometa 4 mg/ adjuvant   GENETIC TESTING: [Dr.Byrnett] NEGATIVE FOR - ATM/ BRCA2/CHEK2/PTEN/TP53/BRCA1/CDH1/PALB2/ STK11   DIAGNOSIS: BREAST CA   STAGE:  I/mammaprint-H ;GOALS: cure  CURRENT/MOST RECENT THERAPY: Arimidex    Malignant neoplasm of upper-outer quadrant of right breast in female, estrogen receptor positive ([place])  11/03/2017 Initial Diagnosis   Malignant neoplasm of upper-outer quadrant of right breast in female, estrogen receptor positive ([place])       INTERVAL HISTORY:  Crystal Branch 50 y.o.  female pleasant patient above history of stage I breast cancer ER PR positive HER-2/neu negative-BSO plus aromatase inhibitor is here for follow-up/is to review the results of the bone scan.  Patient noted to have improvement of her joint pains hip pain after holding anastrozole for a month.  She feels her arthritis is at  baseline at this time.  Denies any weight loss but denies any headaches.  No nausea no vomiting.   Review of Systems  Constitutional: Positive for malaise/fatigue. Negative for chills, diaphoresis, fever and weight loss.  HENT: Negative for nosebleeds and sore throat.   Eyes: Negative for double vision.  Respiratory: Negative for cough, hemoptysis, sputum production, shortness of breath and wheezing.   Cardiovascular: Negative for chest pain, palpitations, orthopnea and leg swelling.  Gastrointestinal: Negative for abdominal pain, blood in stool, constipation, heartburn, melena, nausea and vomiting.  Genitourinary: Negative for dysuria, frequency and urgency.  Musculoskeletal: Positive for joint pain. Negative for back pain.  Skin: Negative for itching.  Neurological: Positive for tingling. Negative for dizziness, focal weakness, weakness and headaches.  Endo/Heme/Allergies: Does not bruise/bleed easily.  Psychiatric/Behavioral: Negative for depression. The patient is not nervous/anxious and does not have insomnia.       PAST MEDICAL HISTORY :  Past Medical History:  Diagnosis Date  . BRCA negative 09/2016   [facility]  . Breast CA ([place]) 09/30/2017   INVASIVE MAMMARY CARCINOMA WITH MUCINOUS FEATURES/ ER/PR 90%; Her 2 neu: Negative.  Mammoprint: High risk.   Marland Kitchen Dysrhythmia    atrial tach/ 2013  . Personal history of chemotherapy   . Personal history of radiation therapy     PAST SURGICAL HISTORY :   Past Surgical History:  Procedure Laterality Date  . 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 BREAST LUMPECTOMY Right 10/26/2017  . BREAST LUMPECTOMY WITH SENTINEL LYMPH NODE BIOPSY Right 10/26/2017   Procedure: BREAST LUMPECTOMY WITH SENTINEL LYMPH  NODE BX;  Surgeon: Robert Bellow, MD;  Location: [facility] ORS;  Service: General;  Laterality: Right;  . COLONOSCOPY WITH PROPOFOL N/A 12/01/2018   Procedure:  COLONOSCOPY WITH PROPOFOL;  Surgeon: Robert Bellow, MD;  Location: [facility] END[facility];  Service: Endoscopy;  Laterality: N/A;  . LAPAROSCOPIC BILATERAL SALPINGO OOPHERECTOMY N/A 01/06/2019   Procedure: LAPAROSCOPIC BILATERAL SALPINGO OOPHORECTOMY;  Surgeon: Will Bonnet, MD;  Location: [facility] ORS;  Service: Gynecology;  Laterality: N/A;  . TONSILLECTOMY  2015    FAMILY HISTORY :   Family History  Problem Relation Age of Onset  . Breast cancer Maternal Aunt 60  . Breast cancer Maternal Grandmother 84  . Colon cancer Mother 39    SOCIAL HISTORY:   Social History   Tobacco Use  . Smoking status: Never Smoker  . Smokeless tobacco: Never Used  Vaping Use  . Vaping Use: Never used  Substance Use Topics  . Alcohol use: Never  . Drug use: Never    ALLERGIES:  has No Known Allergies.  MEDICATIONS:  Current Outpatient Medications  Medication Sig Dispense Refill  . acetaminophen (TYLENOL) 650 MG CR tablet Take 1 tablet by mouth 3 (three) times daily. Prn pain    . Calcium Carb-Cholecalciferol (CALCIUM-VITAMIN D3) 600-500 MG-UNIT CAPS Take 1 capsule by mouth 2 (two) times daily.    . diclofenac Sodium (VOLTAREN) 1 % GEL Apply 2 g topically 4 (four) times daily.    Marland Kitchen exemestane (AROMASIN) 25 MG tablet Take 1 tablet (25 mg total) by mouth daily after breakfast. 30 tablet 4  . Misc Natural Products (OSTEO BI-FLEX/5-LOXIN ADVANCED PO) Take 1 capsule by mouth 2 (two) times daily.    . Multiple Vitamin (MULTIVITAMIN WITH MINERALS) TABS tablet Take 1 tablet by mouth daily.    . Probiotic Product ([place]) CAPS Take 1 capsule by mouth every evening.     . Vitamin D, Ergocalciferol, (DRISDOL) 1.25 MG (50000 UNIT) CAPS capsule TAKE 1 CAPSULE BY MOUTH 1 TIME A WEEK 12 capsule 1   No current facility-administered medications for this visit.    PHYSICAL EXAMINATION: ECOG PERFORMANCE STATUS: 0 - Asymptomatic  BP (!) 113/59   Pulse (!) 50   Temp 97.8 F (36.6 C) (Tympanic)    Resp 20   Ht _0  (1.626 m)   Wt 149 lb (67.6 kg)   BMI 25.58 kg/m   Filed Weights   09/03/20 1251  Weight: 149 lb (67.6 kg)   Physical Exam Constitutional:      Comments: Alone.  Walking by self.  HENT:     Head: Normocephalic and atraumatic.     Mouth/Throat:     Pharynx: No oropharyngeal exudate.  Eyes:     Pupils: Pupils are equal, round, and reactive to light.  Cardiovascular:     Rate and Rhythm: Normal rate and regular rhythm.  Pulmonary:     Effort: No respiratory distress.     Breath sounds: No wheezing.  Abdominal:     General: Bowel sounds are normal. There is no distension.     Palpations: Abdomen is soft. There is no mass.     Tenderness: There is no abdominal tenderness. There is no guarding or rebound.  Musculoskeletal:        General: No tenderness. Normal range of motion.     Cervical back: Normal range of motion and neck supple.  Skin:    General: Skin is warm.  Neurological:     Mental Status: She is alert  and oriented to person, place, and time.  Psychiatric:        Mood and Affect: Affect normal.     LABORATORY DATA:  I have reviewed the data as listed    Component Value Date/Time   NA 142 07/23/2020 1247   NA 143 10/13/2017 0920   NA 142 06/19/2011 2227   K 4.2 07/23/2020 1247   K 3.8 06/19/2011 2227   CL 105 07/23/2020 1247   CL 107 06/19/2011 2227   CO2 30 07/23/2020 1247   CO2 21 06/19/2011 2227   GLUCOSE 87 07/23/2020 1247   GLUCOSE 103 (H) 06/19/2011 2227   BUN 13 07/23/2020 1247   BUN 13 10/13/2017 0920   BUN 10 06/19/2011 2227   CREATININE 0.70 07/23/2020 1247   CREATININE 0.75 06/19/2011 2227   CALCIUM 9.3 07/23/2020 1247   CALCIUM 9.5 06/19/2011 2227   PROT 7.0 07/23/2020 1247   PROT 6.5 10/13/2017 0920   ALBUMIN 4.5 07/23/2020 1247   ALBUMIN 4.6 10/13/2017 0920   AST 24 07/23/2020 1247   ALT 20 07/23/2020 1247   ALKPHOS 48 07/23/2020 1247   BILITOT 0.7 07/23/2020 1247   BILITOT 0.4 10/13/2017 0920   GFRNONAA >60  07/23/2020 1247   GFRNONAA >60 06/19/2011 2227   GFRAA >60 01/24/2020 1247   GFRAA >60 06/19/2011 2227    No results found for: SPEP, UPEP  Lab Results  Component Value Date   WBC 3.5 (L) 07/23/2020   NEUTROABS 1.9 07/23/2020   HGB 12.6 07/23/2020   HCT 37.1 07/23/2020   MCV 96.9 07/23/2020   PLT 211 07/23/2020      Chemistry      Component Value Date/Time   NA 142 07/23/2020 1247   NA 143 10/13/2017 0920   NA 142 06/19/2011 2227   K 4.2 07/23/2020 1247   K 3.8 06/19/2011 2227   CL 105 07/23/2020 1247   CL 107 06/19/2011 2227   CO2 30 07/23/2020 1247   CO2 21 06/19/2011 2227   BUN 13 07/23/2020 1247   BUN 13 10/13/2017 0920   BUN 10 06/19/2011 2227   CREATININE 0.70 07/23/2020 1247   CREATININE 0.75 06/19/2011 2227      Component Value Date/Time   CALCIUM 9.3 07/23/2020 1247   CALCIUM 9.5 06/19/2011 2227   ALKPHOS 48 07/23/2020 1247   AST 24 07/23/2020 1247   ALT 20 07/23/2020 1247   BILITOT 0.7 07/23/2020 1247   BILITOT 0.4 10/13/2017 0920       RADIOGRAPHIC STUDIES: I have personally reviewed the radiological images as listed and agreed with the findings in the report. No results found.   ASSESSMENT & PLAN:  Malignant neoplasm of upper-outer quadrant of right breast in female, estrogen receptor positive ([place]) #Stage I ER PR positive HER-2 negative [HIGH risk mamma print]- on Arimidex + BSO.  Currently anastrozole on hold because of joint pains.  Significant improvement in her joint pains noted after holding anastrozole.  Discontinue anastrozole.  Bone scan March 2022-negative for metastatic disease.  #Recommend proceeding with Aromasin; again discussed the potential side effects including but not limited to MSK symptoms hot flashes etc.  New prescription given.  # Worsening hip pain/Left LE pain-likely secondary to MSK side effects from AI.  March 2022 bone scan negative.  # Continue adjuvant Zometa every 6 months [last 07/18/2020]; repeat Zometa in  September.  [ca-8.8]continue arimidex.   # Hot flashes-grade 1-stable  # BMD- June 2020- T score-Normal; on ca+vit D; on Zometa q  4 M [adjuvant]  # Peripheral neuropathy- G-1; stable.   # DISPOSITION # Follow up in 2 months; MD: no labs-- Dr.B    No orders of the defined types were placed in this encounter.  All questions were answered. The patient knows to call the clinic with any problems, questions or concerns.      Cammie Sickle, MD 09/03/2020 1:21 PM

## 2020-09-03 NOTE — Assessment & Plan Note (Signed)
#  Stage I ER PR positive HER-2 negative [HIGH risk mamma print]- on Arimidex + BSO.  Currently anastrozole on hold because of joint pains.  Significant improvement in her joint pains noted after holding anastrozole.  Discontinue anastrozole.  Bone scan March 2022-negative for metastatic disease.  #Recommend proceeding with Aromasin; again discussed the potential side effects including but not limited to MSK symptoms hot flashes etc.  New prescription given.  # Worsening hip pain/Left LE pain-likely secondary to MSK side effects from AI.  March 2022 bone scan negative.  # Continue adjuvant Zometa every 6 months [last 07/18/2020]; repeat Zometa in September.  [ca-8.8]continue arimidex.   # Hot flashes-grade 1-stable  # BMD- June 2020- T score-Normal; on ca+vit D; on Zometa q 6 M [adjuvant]  # Peripheral neuropathy- G-1; stable.   # DISPOSITION # Follow up in 2 months; MD: no labs-- Dr.B

## 2020-09-13 ENCOUNTER — Encounter: Payer: Self-pay | Admitting: Internal Medicine

## 2020-09-13 ENCOUNTER — Telehealth: Payer: Self-pay | Admitting: Internal Medicine

## 2020-09-13 NOTE — Telephone Encounter (Signed)
On 4/28-spoke to patient regarding her concerns for rash on extremities- ? Related to exemestane.  Slightly improved on Benadryl/hydrocortisone Aquaphor.  Unlikely secondary to Voltaren gel.  C- Recommend follow up on 05/06- MD; no labs-   GB

## 2020-09-21 ENCOUNTER — Inpatient Hospital Stay: Payer: Managed Care, Other (non HMO) | Attending: Internal Medicine | Admitting: Internal Medicine

## 2020-09-21 ENCOUNTER — Encounter: Payer: Self-pay | Admitting: Internal Medicine

## 2020-09-21 DIAGNOSIS — Z170 Estrogen receptor positive status [ER+]: Secondary | ICD-10-CM | POA: Diagnosis not present

## 2020-09-21 DIAGNOSIS — C50411 Malignant neoplasm of upper-outer quadrant of right female breast: Secondary | ICD-10-CM | POA: Diagnosis not present

## 2020-09-21 DIAGNOSIS — Z90722 Acquired absence of ovaries, bilateral: Secondary | ICD-10-CM | POA: Insufficient documentation

## 2020-09-21 DIAGNOSIS — N951 Menopausal and female climacteric states: Secondary | ICD-10-CM | POA: Diagnosis not present

## 2020-09-21 DIAGNOSIS — G629 Polyneuropathy, unspecified: Secondary | ICD-10-CM | POA: Insufficient documentation

## 2020-09-21 NOTE — Assessment & Plan Note (Addendum)
#  Stage I ER PR positive HER-2 negative [HIGH risk mamma print]- on Arimidex + BSO.  Given the rash to Aromasin; Aromasin has been discontinued.  #Again reviewed remaining endocrine options with patient including-letrozole.  However given the concerns for joint aches/myalgias-patient is interested in going back on tamoxifen.  Since patient had bilateral salpingo-oophorectomy-I think is a reasonable choice.  Again reviewed the potential side effects of hot flashes etc.  # Worsening hip pain/Left LE pain-likely secondary to MSK side effects from AI.  March 2022 bone scan negative-[facility].   # Continue adjuvant Zometa every 6 months [last 07/18/2020]; repeat Zometa in September 2022  # Hot flashes-grade 1- STABLE  # BMD- June 2020- T score-Normal; on ca+vit D; on Zometa q 6 M [adjuvant]  # Peripheral neuropathy- G-1STABLE   # DISPOSITION # Follow up as planned in June 2022-MD: no labs-- Dr.B

## 2020-09-21 NOTE — Progress Notes (Signed)
Had a reaction to aromasin. Had a bad rash and was itching really bad.

## 2020-09-21 NOTE — Progress Notes (Signed)
[facility] OFFICE PROGRESS NOTE  Patient Care Team: Cletis Athens, MD as PCP - General (Internal Medicine) Bary Castilla, Forest Gleason, MD as Surgeon (General Surgery) Rico Junker, RN as Oncology Nurse Navigator Noreene Filbert, MD as Referring Physician (Radiation Oncology) Cammie Sickle, MD as Consulting Physician (Internal Medicine) Lequita Asal, MD as Referring Physician (Hematology and Oncology)  Cancer Staging No matching staging information was found for the patient.   Oncology History Overview Note  # May 2019- RIGHT BREAST CA s/p Lumpec & SLNBx [pT1c pN0 (sn); G-1;  margins clear; ER/PR > 90%; Her 2 neu- FISH-NEG. MAMMAPRINT [Dr.Byrnett]- HIGH RISK  # July 29th TC x4 [finished 02/15/2018] s/p RT [dec 11th 2019]; # jan 13th 2020- start Tam; STOPPED in SEP 2020- sec to intolerance [hot flashes]  #Nov 2020-Arimidex [s/p BSO]; STOPPED MARCH 2022- sec to MSK symptoms; MID April 2022- Aromasin 25 mg/day [stopped 3 weeks- STOPPED sec to RASH].   # MAY 6th, 2022- START TAMOXIFEN [BSO]  # March 9th 2020- Triptorelinq 4q; starting June 2020- switch to q12w; August 2020-BSO; discontinue triptorelin.   # march 9th 2020- Zometa 4 mg/ adjuvant   GENETIC TESTING: [Dr.Byrnett] NEGATIVE FOR - ATM/ BRCA2/CHEK2/PTEN/TP53/BRCA1/CDH1/PALB2/ STK11   DIAGNOSIS: BREAST CA   STAGE:  I/mammaprint-H ;GOALS: cure  CURRENT/MOST RECENT THERAPY: aromasin    Malignant neoplasm of upper-outer quadrant of right breast in female, estrogen receptor positive ([place])  11/03/2017 Initial Diagnosis   Malignant neoplasm of upper-outer quadrant of right breast in female, estrogen receptor positive ([place])     INTERVAL HISTORY:  Crystal Branch 50 y.o.  female pleasant patient above history of stage I breast cancer ER PR positive HER-2/neu negative-BSO plus aromatase inhibitor is here for follow-up.  Patient was started on Aromasin approximately 4 weeks ago because of intolerance to  anastrozole-musculoskeletal symptoms.  However patient noted to have a rash on her bilateral lower extremities extremely itchy since she started Aromasin.  Patient's rash resolved since she stopped Aromasin approximately a week ago.  Denies have any joint pains or nausea vomiting.  Review of Systems  Constitutional: Positive for malaise/fatigue. Negative for chills, diaphoresis, fever and weight loss.  HENT: Negative for nosebleeds and sore throat.   Eyes: Negative for double vision.  Respiratory: Negative for cough, hemoptysis, sputum production, shortness of breath and wheezing.   Cardiovascular: Negative for chest pain, palpitations, orthopnea and leg swelling.  Gastrointestinal: Negative for abdominal pain, blood in stool, constipation, heartburn, melena, nausea and vomiting.  Genitourinary: Negative for dysuria, frequency and urgency.  Musculoskeletal: Positive for joint pain. Negative for back pain.  Skin: Negative for itching.  Neurological: Positive for tingling. Negative for dizziness, focal weakness, weakness and headaches.  Endo/Heme/Allergies: Does not bruise/bleed easily.  Psychiatric/Behavioral: Negative for depression. The patient is not nervous/anxious and does not have insomnia.       PAST MEDICAL HISTORY :  Past Medical History:  Diagnosis Date  . BRCA negative 09/2016   [facility]  . Breast CA ([place]) 09/30/2017   INVASIVE MAMMARY CARCINOMA WITH MUCINOUS FEATURES/ ER/PR 90%; Her 2 neu: Negative.  Mammoprint: High risk.   Marland Kitchen Dysrhythmia    atrial tach/ 2013  . Personal history of chemotherapy   . Personal history of radiation therapy     PAST SURGICAL HISTORY :   Past Surgical History:  Procedure Laterality Date  . 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 BREAST LUMPECTOMY Right 10/26/2017  .  BREAST LUMPECTOMY WITH SENTINEL LYMPH NODE BIOPSY Right 10/26/2017   Procedure: BREAST  LUMPECTOMY WITH SENTINEL LYMPH NODE BX;  Surgeon: Robert Bellow, MD;  Location: [facility] ORS;  Service: General;  Laterality: Right;  . COLONOSCOPY WITH PROPOFOL N/A 12/01/2018   Procedure: COLONOSCOPY WITH PROPOFOL;  Surgeon: Robert Bellow, MD;  Location: [facility] END[facility];  Service: Endoscopy;  Laterality: N/A;  . LAPAROSCOPIC BILATERAL SALPINGO OOPHERECTOMY N/A 01/06/2019   Procedure: LAPAROSCOPIC BILATERAL SALPINGO OOPHORECTOMY;  Surgeon: Will Bonnet, MD;  Location: [facility] ORS;  Service: Gynecology;  Laterality: N/A;  . TONSILLECTOMY  2015    FAMILY HISTORY :   Family History  Problem Relation Age of Onset  . Breast cancer Maternal Aunt 60  . Breast cancer Maternal Grandmother 74  . Colon cancer Mother 67    SOCIAL HISTORY:   Social History   Tobacco Use  . Smoking status: Never Smoker  . Smokeless tobacco: Never Used  Vaping Use  . Vaping Use: Never used  Substance Use Topics  . Alcohol use: Never  . Drug use: Never    ALLERGIES:  has No Known Allergies.  MEDICATIONS:  Current Outpatient Medications  Medication Sig Dispense Refill  . acetaminophen (TYLENOL) 650 MG CR tablet Take 1 tablet by mouth 3 (three) times daily. Prn pain    . Calcium Carb-Cholecalciferol (CALCIUM-VITAMIN D3) 600-500 MG-UNIT CAPS Take 1 capsule by mouth 2 (two) times daily.    . diclofenac Sodium (VOLTAREN) 1 % GEL Apply 2 g topically 4 (four) times daily.    . Misc Natural Products (OSTEO BI-FLEX/5-LOXIN ADVANCED PO) Take 1 capsule by mouth 2 (two) times daily.    . Multiple Vitamin (MULTIVITAMIN WITH MINERALS) TABS tablet Take 1 tablet by mouth daily.    . Probiotic Product ([place]) CAPS Take 1 capsule by mouth every evening.     . Vitamin D, Ergocalciferol, (DRISDOL) 1.25 MG (50000 UNIT) CAPS capsule TAKE 1 CAPSULE BY MOUTH 1 TIME A WEEK 12 capsule 1  . exemestane (AROMASIN) 25 MG tablet Take 1 tablet (25 mg total) by mouth daily after breakfast. (Patient not taking:  Reported on 09/21/2020) 30 tablet 4   No current facility-administered medications for this visit.    PHYSICAL EXAMINATION: ECOG PERFORMANCE STATUS: 0 - Asymptomatic  BP (!) 104/47 (BP Location: Left Arm, Patient Position: Sitting, Cuff Size: Normal)   Pulse 70   Temp 99.5 F (37.5 C) (Tympanic)   Resp 16   Ht 5' 4" (1.626 m)   Wt 149 lb 12.8 oz (67.9 kg)   SpO2 100%   BMI 25.71 kg/m   Filed Weights   09/21/20 1345  Weight: 149 lb 12.8 oz (67.9 kg)   Physical Exam Constitutional:      Comments: Alone.  Walking by self.  HENT:     Head: Normocephalic and atraumatic.     Mouth/Throat:     Pharynx: No oropharyngeal exudate.  Eyes:     Pupils: Pupils are equal, round, and reactive to light.  Cardiovascular:     Rate and Rhythm: Normal rate and regular rhythm.  Pulmonary:     Effort: No respiratory distress.     Breath sounds: No wheezing.  Abdominal:     General: Bowel sounds are normal. There is no distension.     Palpations: Abdomen is soft. There is no mass.     Tenderness: There is no abdominal tenderness. There is no guarding or rebound.  Musculoskeletal:  General: No tenderness. Normal range of motion.     Cervical back: Normal range of motion and neck supple.  Skin:    General: Skin is warm.  Neurological:     Mental Status: She is alert and oriented to person, place, and time.  Psychiatric:        Mood and Affect: Affect normal.     LABORATORY DATA:  I have reviewed the data as listed    Component Value Date/Time   NA 142 07/23/2020 1247   NA 143 10/13/2017 0920   NA 142 06/19/2011 2227   K 4.2 07/23/2020 1247   K 3.8 06/19/2011 2227   CL 105 07/23/2020 1247   CL 107 06/19/2011 2227   CO2 30 07/23/2020 1247   CO2 21 06/19/2011 2227   GLUCOSE 87 07/23/2020 1247   GLUCOSE 103 (H) 06/19/2011 2227   BUN 13 07/23/2020 1247   BUN 13 10/13/2017 0920   BUN 10 06/19/2011 2227   CREATININE 0.70 07/23/2020 1247   CREATININE 0.75 06/19/2011 2227    CALCIUM 9.3 07/23/2020 1247   CALCIUM 9.5 06/19/2011 2227   PROT 7.0 07/23/2020 1247   PROT 6.5 10/13/2017 0920   ALBUMIN 4.5 07/23/2020 1247   ALBUMIN 4.6 10/13/2017 0920   AST 24 07/23/2020 1247   ALT 20 07/23/2020 1247   ALKPHOS 48 07/23/2020 1247   BILITOT 0.7 07/23/2020 1247   BILITOT 0.4 10/13/2017 0920   GFRNONAA >60 07/23/2020 1247   GFRNONAA >60 06/19/2011 2227   GFRAA >60 01/24/2020 1247   GFRAA >60 06/19/2011 2227    No results found for: SPEP, UPEP  Lab Results  Component Value Date   WBC 3.5 (L) 07/23/2020   NEUTROABS 1.9 07/23/2020   HGB 12.6 07/23/2020   HCT 37.1 07/23/2020   MCV 96.9 07/23/2020   PLT 211 07/23/2020      Chemistry      Component Value Date/Time   NA 142 07/23/2020 1247   NA 143 10/13/2017 0920   NA 142 06/19/2011 2227   K 4.2 07/23/2020 1247   K 3.8 06/19/2011 2227   CL 105 07/23/2020 1247   CL 107 06/19/2011 2227   CO2 30 07/23/2020 1247   CO2 21 06/19/2011 2227   BUN 13 07/23/2020 1247   BUN 13 10/13/2017 0920   BUN 10 06/19/2011 2227   CREATININE 0.70 07/23/2020 1247   CREATININE 0.75 06/19/2011 2227      Component Value Date/Time   CALCIUM 9.3 07/23/2020 1247   CALCIUM 9.5 06/19/2011 2227   ALKPHOS 48 07/23/2020 1247   AST 24 07/23/2020 1247   ALT 20 07/23/2020 1247   BILITOT 0.7 07/23/2020 1247   BILITOT 0.4 10/13/2017 0920       RADIOGRAPHIC STUDIES: I have personally reviewed the radiological images as listed and agreed with the findings in the report. No results found.   ASSESSMENT & PLAN:  Malignant neoplasm of upper-outer quadrant of right breast in female, estrogen receptor positive ([place]) #Stage I ER PR positive HER-2 negative [HIGH risk mamma print]- on Arimidex + BSO.  Given the rash to Aromasin; Aromasin has been discontinued.  #Again reviewed remaining endocrine options with patient including-letrozole.  However given the concerns for joint aches/myalgias-patient is interested in going back on tamoxifen.   Since patient had bilateral salpingo-oophorectomy-I think is a reasonable choice.  Again reviewed the potential side effects of hot flashes etc.  # Worsening hip pain/Left LE pain-likely secondary to MSK side effects from AI.  March  2022 bone scan negative-[facility].   # Continue adjuvant Zometa every 6 months [last 07/18/2020]; repeat Zometa in September 2022  # Hot flashes-grade 1- STABLE  # BMD- June 2020- T score-Normal; on ca+vit D; on Zometa q 6 M [adjuvant]  # Peripheral neuropathy- G-1STABLE   # DISPOSITION # Follow up as planned in June 2022-MD: no labs-- Dr.B    No orders of the defined types were placed in this encounter.  All questions were answered. The patient knows to call the clinic with any problems, questions or concerns.      Cammie Sickle, MD 09/21/2020 2:10 PM

## 2020-10-04 ENCOUNTER — Other Ambulatory Visit: Payer: Self-pay | Admitting: General Surgery

## 2020-10-04 DIAGNOSIS — Z170 Estrogen receptor positive status [ER+]: Secondary | ICD-10-CM

## 2020-10-04 DIAGNOSIS — C50411 Malignant neoplasm of upper-outer quadrant of right female breast: Secondary | ICD-10-CM

## 2020-11-05 ENCOUNTER — Encounter: Payer: Self-pay | Admitting: Internal Medicine

## 2020-11-05 ENCOUNTER — Inpatient Hospital Stay: Payer: Managed Care, Other (non HMO) | Attending: Internal Medicine | Admitting: Internal Medicine

## 2020-11-05 ENCOUNTER — Ambulatory Visit
Admission: RE | Admit: 2020-11-05 | Discharge: 2020-11-05 | Disposition: A | Discharge status: Home / Self Care | Payer: Managed Care, Other (non HMO) | Source: Ambulatory Visit | Attending: Radiation Oncology | Admitting: Radiation Oncology

## 2020-11-05 VITALS — BP 104/45 | HR 57 | Temp 97.9°F | Resp 14 | Wt 149.0 lb

## 2020-11-05 VITALS — BP 119/55 | HR 55 | Temp 96.2°F | Resp 16 | Wt 149.5 lb

## 2020-11-05 DIAGNOSIS — C50411 Malignant neoplasm of upper-outer quadrant of right female breast: Secondary | ICD-10-CM | POA: Insufficient documentation

## 2020-11-05 DIAGNOSIS — Z79810 Long term (current) use of selective estrogen receptor modulators (SERMs): Secondary | ICD-10-CM | POA: Insufficient documentation

## 2020-11-05 DIAGNOSIS — Z170 Estrogen receptor positive status [ER+]: Secondary | ICD-10-CM | POA: Insufficient documentation

## 2020-11-05 DIAGNOSIS — Z923 Personal history of irradiation: Secondary | ICD-10-CM | POA: Insufficient documentation

## 2020-11-05 DIAGNOSIS — N951 Menopausal and female climacteric states: Secondary | ICD-10-CM | POA: Diagnosis not present

## 2020-11-05 DIAGNOSIS — G629 Polyneuropathy, unspecified: Secondary | ICD-10-CM | POA: Insufficient documentation

## 2020-11-05 NOTE — Assessment & Plan Note (Addendum)
#  Stage I ER PR positive HER-2 negative [HIGH risk mamma print]- on Arimidex + BSO.  Given the rash to Aromasin; Aromasin has been discontinued.  # On Tamoxifen [BSO]-tolerating fairly well.  Except for mild hot flashes.  # Hip pain/Left LE pain-G-1- on Tamoxifen [improved off AI] stable.  # Hot flashes- G-2 [drinking cold water]; Cold sweats-discussed meciations; but hold off for now.   # BMD- June 2020- T score-Normal; Continue adjuvant Zometa every 6 months [last 07/18/2020]; repeat Zometa in September 2022; continue calcium plus vitamin D.  # Peripheral neuropathy- G-1STABLE   # DISPOSITION # Follow up in 3 months- MD; labs- cbc/cmp-Zometa-- Dr.B

## 2020-11-05 NOTE — Progress Notes (Signed)
Radiation Oncology Follow up Note  Name: Crystal Branch   Date:   11/05/2020 MRN:  [id number] DOB: 1971-03-12    This 50 y.o. female presents to the clinic today for 2-year follow-up status post whole breast radiation to her right breast for stage I ER/PR positive invasive mammary carcinoma.  REFERRING PROVIDER: Cletis Athens, MD  HPI: Patient is a 50 year old female now out 2 years having completed whole breast radiation to her right breast for stage I ER/PR positive invasive mammary carcinoma.  Seen today in routine follow-up she is doing well.  She specifically denies breast tenderness cough or bone pain..  Mammograms performed last June were BI-RADS 2 benign which I have reviewed she is scheduled for mammograms next month which I will review when the become available.  She is currently on tamoxifen tolerating it well without side effect.  COMPLICATIONS OF TREATMENT: none  FOLLOW UP COMPLIANCE: keeps appointments   PHYSICAL EXAM:  BP (!) 119/55 (BP Location: Left Arm, Patient Position: Sitting)   Pulse (!) 55   Temp (!) 96.2 F (35.7 C) (Tympanic)   Resp 16   Wt 149 lb 8 oz (67.8 kg)   BMI 25.66 kg/m  Lungs are clear to A&P cardiac examination essentially unremarkable with regular rate and rhythm. No dominant mass or nodularity is noted in either breast in 2 positions examined. Incision is well-healed. No axillary or supraclavicular adenopathy is appreciated. Cosmetic result is excellent.  Well-developed well-nourished patient in NAD. HEENT reveals PERLA, EOMI, discs not visualized.  Oral cavity is clear. No oral mucosal lesions are identified. Neck is clear without evidence of cervical or supraclavicular adenopathy. Lungs are clear to A&P. Cardiac examination is essentially unremarkable with regular rate and rhythm without murmur rub or thrill. Abdomen is benign with no organomegaly or masses noted. Motor sensory and DTR levels are equal and symmetric in the upper and lower extremities.  Cranial nerves II through XII are grossly intact. Proprioception is intact. No peripheral adenopathy or edema is identified. No motor or sensory levels are noted. Crude visual fields are within normal range.  RADIOLOGY RESULTS: Mammograms reviewed compatible with above-stated findings  PLAN: Present time patient is out over 2 years doing well with no evidence of disease.  She is tolerating tamoxifen well without side effect.  I have asked to see her back in a whole year for follow-up.  Patient knows to call with any concerns at any time.  I would like to take this opportunity to thank you for allowing me to participate in the care of your patient.Noreene Filbert, MD

## 2020-11-05 NOTE — Progress Notes (Signed)
Refill on D3.

## 2020-11-06 ENCOUNTER — Other Ambulatory Visit: Payer: Self-pay | Admitting: Internal Medicine

## 2020-11-07 ENCOUNTER — Encounter: Payer: Self-pay | Admitting: Internal Medicine

## 2020-11-07 ENCOUNTER — Other Ambulatory Visit: Payer: Self-pay | Admitting: *Deleted

## 2020-11-07 MED ORDER — VITAMIN D (ERGOCALCIFEROL) 1.25 MG (50000 UNIT) PO CAPS: ORAL_CAPSULE | ORAL | 1 refills | Status: DC

## 2020-11-11 ENCOUNTER — Encounter: Payer: Self-pay | Admitting: Internal Medicine

## 2020-11-11 NOTE — Progress Notes (Signed)
[facility] OFFICE PROGRESS NOTE  Patient Care Team: Cletis Athens, MD as PCP - General (Internal Medicine) Bary Castilla, Forest Gleason, MD as Surgeon (General Surgery) Rico Junker, RN as Oncology Nurse Navigator Noreene Filbert, MD as Referring Physician (Radiation Oncology) Cammie Sickle, MD as Consulting Physician (Internal Medicine) Lequita Asal, MD as Referring Physician (Hematology and Oncology)  Cancer Staging No matching staging information was found for the patient.   Oncology History Overview Note  # May 2019- RIGHT BREAST CA s/p Lumpec & SLNBx [pT1c pN0 (sn); G-1;  margins clear; ER/PR > 90%; Her 2 neu- FISH-NEG. MAMMAPRINT [Dr.Byrnett]- HIGH RISK  # July 29th TC x4 [finished 02/15/2018] s/p RT [dec 11th 2019]; # jan 13th 2020- start Tam; STOPPED in SEP 2020- sec to intolerance [hot flashes]  #Nov 2020-Arimidex [s/p BSO]; STOPPED MARCH 2022- sec to MSK symptoms; MID April 2022- Aromasin 25 mg/day [stopped 3 weeks- STOPPED sec to RASH].   # MAY 6th, 2022- START TAMOXIFEN [BSO]  # March 9th 2020- Triptorelinq 4q; starting June 2020- switch to q12w; August 2020-BSO; discontinue triptorelin.   # march 9th 2020- Zometa 4 mg/ adjuvant   GENETIC TESTING: [Dr.Byrnett] NEGATIVE FOR - ATM/ BRCA2/CHEK2/PTEN/TP53/BRCA1/CDH1/PALB2/ STK11   DIAGNOSIS: BREAST CA   STAGE:  I/mammaprint-H ;GOALS: cure  CURRENT/MOST RECENT THERAPY: aromasin    Malignant neoplasm of upper-outer quadrant of right breast in female, estrogen receptor positive ([place])  11/03/2017 Initial Diagnosis   Malignant neoplasm of upper-outer quadrant of right breast in female, estrogen receptor positive ([place])      INTERVAL HISTORY:  Crystal Branch 50 y.o.  female pleasant patient above history of stage I breast cancer ER PR positive HER-2/neu negative-BSO -currently on tamoxifen is here for follow-up.  Patient complains of mild hot flashes.  Otherwise not any worse.  Denies any  worsening joint pains/bone pain.   Denies any chest pain or shortness of breath or cough.  No swelling in the legs.  Review of Systems  Constitutional:  Positive for malaise/fatigue. Negative for chills, diaphoresis, fever and weight loss.  HENT:  Negative for nosebleeds and sore throat.   Eyes:  Negative for double vision.  Respiratory:  Negative for cough, hemoptysis, sputum production, shortness of breath and wheezing.   Cardiovascular:  Negative for chest pain, palpitations, orthopnea and leg swelling.  Gastrointestinal:  Negative for abdominal pain, blood in stool, constipation, heartburn, melena, nausea and vomiting.  Genitourinary:  Negative for dysuria, frequency and urgency.  Musculoskeletal:  Positive for joint pain. Negative for back pain.  Skin:  Negative for itching.  Neurological:  Positive for tingling. Negative for dizziness, focal weakness, weakness and headaches.  Endo/Heme/Allergies:  Does not bruise/bleed easily.  Psychiatric/Behavioral:  Negative for depression. The patient is not nervous/anxious and does not have insomnia.      PAST MEDICAL HISTORY :  Past Medical History:  Diagnosis Date  . BRCA negative 09/2016   [facility]  . Breast CA ([place]) 09/30/2017   INVASIVE MAMMARY CARCINOMA WITH MUCINOUS FEATURES/ ER/PR 90%; Her 2 neu: Negative.  Mammoprint: High risk.   Marland Kitchen Dysrhythmia    atrial tach/ 2013  . Personal history of chemotherapy   . Personal history of radiation therapy     PAST SURGICAL HISTORY :   Past Surgical History:  Procedure Laterality Date  . 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 BREAST LUMPECTOMY Right 10/26/2017  . BREAST LUMPECTOMY WITH SENTINEL LYMPH  NODE BIOPSY Right 10/26/2017   Procedure: BREAST LUMPECTOMY WITH SENTINEL LYMPH NODE BX;  Surgeon: Robert Bellow, MD;  Location: [facility] ORS;  Service: General;  Laterality: Right;  . COLONOSCOPY WITH PROPOFOL N/A  12/01/2018   Procedure: COLONOSCOPY WITH PROPOFOL;  Surgeon: Robert Bellow, MD;  Location: [facility];  Service: Endoscopy;  Laterality: N/A;  . LAPAROSCOPIC BILATERAL SALPINGO OOPHERECTOMY N/A 01/06/2019   Procedure: LAPAROSCOPIC BILATERAL SALPINGO OOPHORECTOMY;  Surgeon: Will Bonnet, MD;  Location: [facility] OR[facility];  Service: Gynecology;  Laterality: N/A;  . TONSILLECTOMY  2015    FAMILY HISTORY :   Family History  Problem Relation Age of Onset  . Breast cancer Maternal Aunt 60  . Breast cancer Maternal Grandmother 7  . Colon cancer Mother 61    SOCIAL HISTORY:   Social History   Tobacco Use  . Smoking status: Never  . Smokeless tobacco: Never  Vaping Use  . Vaping Use: Never used  Substance Use Topics  . Alcohol use: Never  . Drug use: Never    ALLERGIES:  has No Known Allergies.  MEDICATIONS:  Current Outpatient Medications  Medication Sig Dispense Refill  . acetaminophen (TYLENOL) 650 MG CR tablet Take 1 tablet by mouth 3 (three) times daily. Prn pain    . Calcium Carb-Cholecalciferol (CALCIUM-VITAMIN D3) 600-500 MG-UNIT CAPS Take 1 capsule by mouth 2 (two) times daily.    . diclofenac Sodium (VOLTAREN) 1 % GEL Apply 2 g topically 4 (four) times daily.    . Misc Natural Products (OSTEO BI-FLEX/5-LOXIN ADVANCED PO) Take 1 capsule by mouth 2 (two) times daily.    . Multiple Vitamin (MULTIVITAMIN WITH MINERALS) TABS tablet Take 1 tablet by mouth daily.    . Probiotic Product ([place]) CAPS Take 1 capsule by mouth every evening.     . tamoxifen (NOLVADEX) 20 MG tablet TAKE 1 TABLET BY MOUTH  DAILY 90 tablet 3  . Vitamin D, Ergocalciferol, (DRISDOL) 1.25 MG (50000 UNIT) CAPS capsule TAKE 1 CAPSULE BY MOUTH 1 TIME A WEEK 12 capsule 1   No current facility-administered medications for this visit.    PHYSICAL EXAMINATION: ECOG PERFORMANCE STATUS: 0 - Asymptomatic  BP (!) 104/45   Pulse (!) 57   Temp 97.9 F (36.6 C)   Resp 14   Wt 149 lb (67.6  kg)   SpO2 100%   BMI 25.58 kg/m   Filed Weights   11/05/20 1002  Weight: 149 lb (67.6 kg)   Physical Exam Constitutional:      Comments: Alone.  Walking by self.  HENT:     Head: Normocephalic and atraumatic.     Mouth/Throat:     Pharynx: No oropharyngeal exudate.  Eyes:     Pupils: Pupils are equal, round, and reactive to light.  Cardiovascular:     Rate and Rhythm: Normal rate and regular rhythm.  Pulmonary:     Effort: No respiratory distress.     Breath sounds: No wheezing.  Abdominal:     General: Bowel sounds are normal. There is no distension.     Palpations: Abdomen is soft. There is no mass.     Tenderness: There is no abdominal tenderness. There is no guarding or rebound.  Musculoskeletal:        General: No tenderness. Normal range of motion.     Cervical back: Normal range of motion and neck supple.  Skin:    General: Skin is warm.  Neurological:     Mental Status: She  is alert and oriented to person, place, and time.  Psychiatric:        Mood and Affect: Affect normal.    LABORATORY DATA:  I have reviewed the data as listed    Component Value Date/Time   NA 142 07/23/2020 1247   NA 143 10/13/2017 0920   NA 142 06/19/2011 2227   K 4.2 07/23/2020 1247   K 3.8 06/19/2011 2227   CL 105 07/23/2020 1247   CL 107 06/19/2011 2227   CO2 30 07/23/2020 1247   CO2 21 06/19/2011 2227   GLUCOSE 87 07/23/2020 1247   GLUCOSE 103 (H) 06/19/2011 2227   BUN 13 07/23/2020 1247   BUN 13 10/13/2017 0920   BUN 10 06/19/2011 2227   CREATININE 0.70 07/23/2020 1247   CREATININE 0.75 06/19/2011 2227   CALCIUM 9.3 07/23/2020 1247   CALCIUM 9.5 06/19/2011 2227   PROT 7.0 07/23/2020 1247   PROT 6.5 10/13/2017 0920   ALBUMIN 4.5 07/23/2020 1247   ALBUMIN 4.6 10/13/2017 0920   AST 24 07/23/2020 1247   ALT 20 07/23/2020 1247   ALKPHOS 48 07/23/2020 1247   BILITOT 0.7 07/23/2020 1247   BILITOT 0.4 10/13/2017 0920   GFRNONAA >60 07/23/2020 1247   GFRNONAA >60  06/19/2011 2227   GFRAA >60 01/24/2020 1247   GFRAA >60 06/19/2011 2227    No results found for: SPEP, UPEP  Lab Results  Component Value Date   WBC 3.5 (L) 07/23/2020   NEUTROABS 1.9 07/23/2020   HGB 12.6 07/23/2020   HCT 37.1 07/23/2020   MCV 96.9 07/23/2020   PLT 211 07/23/2020      Chemistry      Component Value Date/Time   NA 142 07/23/2020 1247   NA 143 10/13/2017 0920   NA 142 06/19/2011 2227   K 4.2 07/23/2020 1247   K 3.8 06/19/2011 2227   CL 105 07/23/2020 1247   CL 107 06/19/2011 2227   CO2 30 07/23/2020 1247   CO2 21 06/19/2011 2227   BUN 13 07/23/2020 1247   BUN 13 10/13/2017 0920   BUN 10 06/19/2011 2227   CREATININE 0.70 07/23/2020 1247   CREATININE 0.75 06/19/2011 2227      Component Value Date/Time   CALCIUM 9.3 07/23/2020 1247   CALCIUM 9.5 06/19/2011 2227   ALKPHOS 48 07/23/2020 1247   AST 24 07/23/2020 1247   ALT 20 07/23/2020 1247   BILITOT 0.7 07/23/2020 1247   BILITOT 0.4 10/13/2017 0920       RADIOGRAPHIC STUDIES: I have personally reviewed the radiological images as listed and agreed with the findings in the report. No results found.   ASSESSMENT & PLAN:  Malignant neoplasm of upper-outer quadrant of right breast in female, estrogen receptor positive ([place]) #Stage I ER PR positive HER-2 negative [HIGH risk mamma print]- on Arimidex + BSO.  Given the rash to Aromasin; Aromasin has been discontinued.  # On Tamoxifen [BSO]  # Hip pain/Left LE pain-G-1- on Tamoxifen [improved off AI]  # Hot flashes- G-2 [drinking cold water]; Cold sweats-discussed meciations; but hold off for now.   # Continue adjuvant Zometa every 6 months [last 07/18/2020]; repeat Zometa in September 2022  # BMD- June 2020- T score-Normal; on ca+vit D; on Zometa q 6 M [adjuvant]  # Peripheral neuropathy- G-1STABLE   # DISPOSITION # Follow up in 3 months- MD; labs- cbc/cmp-Zometa-- Dr.B    Orders Placed This Encounter  Procedures  . CBC with  Differential/Platelet    Standing  Status:   Future    Standing Expiration Date:   11/05/2021  . Comprehensive metabolic panel    Standing Status:   Future    Standing Expiration Date:   11/05/2021    All questions were answered. The patient knows to call the clinic with any problems, questions or concerns.      Cammie Sickle, MD 11/11/2020 9:55 PM

## 2020-11-14 ENCOUNTER — Encounter: Payer: Self-pay | Admitting: Internal Medicine

## 2020-11-22 ENCOUNTER — Ambulatory Visit
Admission: RE | Admit: 2020-11-22 | Discharge: 2020-11-22 | Disposition: A | Discharge status: Home / Self Care | Payer: Managed Care, Other (non HMO) | Source: Ambulatory Visit | Attending: General Surgery | Admitting: General Surgery

## 2020-11-22 ENCOUNTER — Other Ambulatory Visit: Payer: Self-pay

## 2020-11-22 DIAGNOSIS — Z1231 Encounter for screening mammogram for malignant neoplasm of breast: Secondary | ICD-10-CM | POA: Insufficient documentation

## 2020-11-22 DIAGNOSIS — Z170 Estrogen receptor positive status [ER+]: Secondary | ICD-10-CM | POA: Diagnosis not present

## 2020-11-22 DIAGNOSIS — Z853 Personal history of malignant neoplasm of breast: Secondary | ICD-10-CM | POA: Diagnosis not present

## 2020-11-22 DIAGNOSIS — C50411 Malignant neoplasm of upper-outer quadrant of right female breast: Secondary | ICD-10-CM

## 2021-01-25 ENCOUNTER — Other Ambulatory Visit: Payer: Self-pay | Admitting: Internal Medicine

## 2021-02-04 ENCOUNTER — Inpatient Hospital Stay: Payer: Managed Care, Other (non HMO)

## 2021-02-04 ENCOUNTER — Inpatient Hospital Stay: Payer: Managed Care, Other (non HMO) | Attending: Internal Medicine

## 2021-02-04 ENCOUNTER — Inpatient Hospital Stay: Payer: Managed Care, Other (non HMO) | Admitting: Internal Medicine

## 2021-02-04 ENCOUNTER — Encounter: Payer: Self-pay | Admitting: Internal Medicine

## 2021-02-04 VITALS — BP 106/66 | HR 62 | Temp 97.9°F | Resp 16 | Wt 152.0 lb

## 2021-02-04 DIAGNOSIS — C50411 Malignant neoplasm of upper-outer quadrant of right female breast: Secondary | ICD-10-CM

## 2021-02-04 DIAGNOSIS — Z923 Personal history of irradiation: Secondary | ICD-10-CM | POA: Insufficient documentation

## 2021-02-04 DIAGNOSIS — Z7982 Long term (current) use of aspirin: Secondary | ICD-10-CM | POA: Diagnosis not present

## 2021-02-04 DIAGNOSIS — Z79899 Other long term (current) drug therapy: Secondary | ICD-10-CM | POA: Insufficient documentation

## 2021-02-04 DIAGNOSIS — M2550 Pain in unspecified joint: Secondary | ICD-10-CM | POA: Insufficient documentation

## 2021-02-04 DIAGNOSIS — Z170 Estrogen receptor positive status [ER+]: Secondary | ICD-10-CM

## 2021-02-04 DIAGNOSIS — R232 Flushing: Secondary | ICD-10-CM | POA: Insufficient documentation

## 2021-02-04 DIAGNOSIS — Z79810 Long term (current) use of selective estrogen receptor modulators (SERMs): Secondary | ICD-10-CM | POA: Diagnosis not present

## 2021-02-04 DIAGNOSIS — Z9221 Personal history of antineoplastic chemotherapy: Secondary | ICD-10-CM | POA: Diagnosis not present

## 2021-02-04 LAB — COMPREHENSIVE METABOLIC PANEL
ALT: 25 U/L (ref 0–44)
AST: 29 U/L (ref 15–41)
Albumin: 4.5 g/dL (ref 3.5–5.0)
Alkaline Phosphatase: 47 U/L (ref 38–126)
Anion gap: 7 (ref 5–15)
BUN: 11 mg/dL (ref 6–20)
CO2: 29 mmol/L (ref 22–32)
Calcium: 8.8 mg/dL — ABNORMAL LOW (ref 8.9–10.3)
Chloride: 104 mmol/L (ref 98–111)
Creatinine, Ser: 0.58 mg/dL (ref 0.44–1.00)
GFR, Estimated: >60 mL/min (ref >60)
Glucose, Bld: 80 mg/dL (ref 70–99)
Potassium: 3.7 mmol/L (ref 3.5–5.1)
Sodium: 140 mmol/L (ref 135–145)
Total Bilirubin: 0.6 mg/dL (ref 0.3–1.2)
Total Protein: 7 g/dL (ref 6.5–8.1)

## 2021-02-04 LAB — CBC WITH DIFFERENTIAL/PLATELET
Abs Immature Granulocytes: 0.01 10*3/uL (ref 0.00–0.07)
Basophils Absolute: 0 10*3/uL (ref 0.0–0.1)
Basophils Relative: 1 %
Eosinophils Absolute: 0.1 10*3/uL (ref 0.0–0.5)
Eosinophils Relative: 2 %
HCT: 36.9 % (ref 36.0–46.0)
Hemoglobin: 11.9 g/dL — ABNORMAL LOW (ref 12.0–15.0)
Immature Granulocytes: 0 %
Lymphocytes Relative: 34 %
Lymphs Abs: 1 10*3/uL (ref 0.7–4.0)
MCH: 32.3 pg (ref 26.0–34.0)
MCHC: 32.2 g/dL (ref 30.0–36.0)
MCV: 100.3 fL — ABNORMAL HIGH (ref 80.0–100.0)
Monocytes Absolute: 0.3 10*3/uL (ref 0.1–1.0)
Monocytes Relative: 9 %
Neutro Abs: 1.6 10*3/uL — ABNORMAL LOW (ref 1.7–7.7)
Neutrophils Relative %: 54 %
Platelets: 214 10*3/uL (ref 150–400)
RBC: 3.68 MIL/uL — ABNORMAL LOW (ref 3.87–5.11)
RDW: 12.3 % (ref 11.5–15.5)
WBC: 2.9 10*3/uL — ABNORMAL LOW (ref 4.0–10.5)
nRBC: 0 % (ref 0.0–0.2)

## 2021-02-04 MED ORDER — SODIUM CHLORIDE 0.9 % IV SOLN
Freq: Once | INTRAVENOUS | Status: AC
  Filled 2021-02-04: qty 250

## 2021-02-04 MED ORDER — ZOLEDRONIC ACID 4 MG/100ML IV SOLN
4.0000 mg | Freq: Once | INTRAVENOUS | Status: AC
  Administered 2021-02-04: 4 mg via INTRAVENOUS
  Filled 2021-02-04: qty 100

## 2021-02-04 NOTE — Progress Notes (Signed)
[place] OFFICE PROGRESS NOTE  Patient Care Team: Cletis Athens, MD as PCP - General (Internal Medicine) Bary Castilla, Forest Gleason, MD as Surgeon (General Surgery) Rico Junker, RN as Oncology Nurse Navigator Noreene Filbert, MD as Referring Physician (Radiation Oncology) Cammie Sickle, MD as Consulting Physician (Internal Medicine) Lequita Asal, MD (Inactive) as Referring Physician (Hematology and Oncology)  Cancer Staging No matching staging information was found for the patient.   Oncology History Overview Note  # May 2019- RIGHT BREAST CA s/p Lumpec & SLNBx [pT1c pN0 (sn); G-1;  margins clear; ER/PR > 90%; Her 2 neu- FISH-NEG. MAMMAPRINT [Dr.Byrnett]- HIGH RISK  # July 29th TC x4 [finished 02/15/2018] s/p RT [dec 11th 2019]; # jan 13th 2020- start Tam; STOPPED in SEP 2020- sec to intolerance [hot flashes]  #Nov 2020-Arimidex [s/p BSO]; STOPPED MARCH 2022- sec to MSK symptoms; MID April 2022- Aromasin 25 mg/day [stopped 3 weeks- STOPPED sec to RASH].   # MAY 6th, 2022- START TAMOXIFEN [BSO]  # March 9th 2020- Triptorelinq 4q; starting June 2020- switch to q12w; August 2020-BSO; discontinue triptorelin.   # march 9th 2020- Zometa 4 mg/ adjuvant   GENETIC TESTING: [Dr.Byrnett] NEGATIVE FOR - ATM/ BRCA2/CHEK2/PTEN/TP53/BRCA1/CDH1/PALB2/ STK11   DIAGNOSIS: BREAST CA   STAGE:  I/mammaprint-H ;GOALS: cure  CURRENT/MOST RECENT THERAPY: aromasin    Malignant neoplasm of upper-outer quadrant of right breast in female, estrogen receptor positive ([place])  11/03/2017 Initial Diagnosis   Malignant neoplasm of upper-outer quadrant of right breast in female, estrogen receptor positive ([place])     INTERVAL HISTORY:  C[facility] Branch 50 y.o.  female pleasant patient above history of stage I breast cancer ER PR positive HER-2/neu negative-BSO -currently on tamoxifen is here for follow-up.  No nausea no vomiting.  Continues to have mild hot flashes.  No significant  weight gain.  No significant mood changes.  Mild joint pains.  No worse.  Ongoing fatigue.  No risk of infections.  Review of Systems  Constitutional:  Positive for malaise/fatigue. Negative for chills, diaphoresis, fever and weight loss.  HENT:  Negative for nosebleeds and sore throat.   Eyes:  Negative for double vision.  Respiratory:  Negative for cough, hemoptysis, sputum production, shortness of breath and wheezing.   Cardiovascular:  Negative for chest pain, palpitations, orthopnea and leg swelling.  Gastrointestinal:  Negative for abdominal pain, blood in stool, constipation, heartburn, melena, nausea and vomiting.  Genitourinary:  Negative for dysuria, frequency and urgency.  Musculoskeletal:  Positive for joint pain. Negative for back pain.  Skin:  Negative for itching.  Neurological:  Positive for tingling. Negative for dizziness, focal weakness, weakness and headaches.  Endo/Heme/Allergies:  Does not bruise/bleed easily.  Psychiatric/Behavioral:  Negative for depression. The patient is not nervous/anxious and does not have insomnia.      PAST MEDICAL HISTORY :  Past Medical History:  Diagnosis Date  . BRCA negative 09/2016   [facility]  . 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  Mammoprint: High risk.   Marland Kitchen Dysrhythmia    atrial tach/ 2013  . Personal history of chemotherapy   . Personal history of radiation therapy     PAST SURGICAL HISTORY :   Past Surgical History:  Procedure Laterality Date  . 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 BREAST LUMPECTOMY Right 10/26/2017  . BREAST LUMPECTOMY WITH SENTINEL LYMPH  NODE BIOPSY Right 10/26/2017   Procedure: BREAST LUMPECTOMY WITH SENTINEL LYMPH NODE BX;  Surgeon: Robert Bellow, MD;  Location: [facility] ORS;  Service: General;  Laterality: Right;  . COLONOSCOPY WITH PROPOFOL N/A  12/01/2018   Procedure: COLONOSCOPY WITH PROPOFOL;  Surgeon: Robert Bellow, MD;  Location: [facility] ENDOSCOPY;  Service: Endoscopy;  Laterality: N/A;  . LAPAROSCOPIC BILATERAL SALPINGO OOPHERECTOMY N/A 01/06/2019   Procedure: LAPAROSCOPIC BILATERAL SALPINGO OOPHORECTOMY;  Surgeon: Will Bonnet, MD;  Location: [facility] ORS;  Service: Gynecology;  Laterality: N/A;  . TONSILLECTOMY  2015    FAMILY HISTORY :   Family History  Problem Relation Age of Onset  . Breast cancer Maternal Aunt 60  . Breast cancer Maternal Grandmother 62  . Colon cancer Mother 73    SOCIAL HISTORY:   Social History   Tobacco Use  . Smoking status: Never  . Smokeless tobacco: Never  Vaping Use  . Vaping Use: Never used  Substance Use Topics  . Alcohol use: Never  . Drug use: Never    ALLERGIES:  is allergic to exemestane.  MEDICATIONS:  Current Outpatient Medications  Medication Sig Dispense Refill  . acetaminophen (TYLENOL) 650 MG CR tablet Take 1 tablet by mouth 3 (three) times daily. Prn pain    . aspirin 81 MG chewable tablet Chew 81 mg by mouth daily.    . Calcium Carb-Cholecalciferol (CALCIUM-VITAMIN D3) 600-500 MG-UNIT CAPS Take 1 capsule by mouth 2 (two) times daily.    . diclofenac Sodium (VOLTAREN) 1 % GEL Apply 2 g topically 4 (four) times daily.    . Misc Natural Products (OSTEO BI-FLEX/5-LOXIN ADVANCED PO) Take 1 capsule by mouth 2 (two) times daily.    . Multiple Vitamin (MULTIVITAMIN WITH MINERALS) TABS tablet Take 1 tablet by mouth daily.    . Probiotic Product ([place]) CAPS Take 1 capsule by mouth every evening.     . tamoxifen (NOLVADEX) 20 MG tablet TAKE 1 TABLET BY MOUTH  DAILY 90 tablet 3  . Vitamin D, Ergocalciferol, (DRISDOL) 1.25 MG (50000 UNIT) CAPS capsule TAKE 1 CAPSULE BY MOUTH 1 TIME A WEEK 12 capsule 1   No current facility-administered medications for this visit.   Facility-Administered Medications Ordered in Other Visits  Medication Dose Route Frequency  Provider Last Rate Last Admin  . 0.9 %  sodium chloride infusion   Intravenous Once Charlaine Dalton R, MD      . Zoledronic Acid (ZOMETA) IVPB 4 mg  4 mg Intravenous Once Cammie Sickle, MD        PHYSICAL EXAMINATION: ECOG PERFORMANCE STATUS: 0 - Asymptomatic  BP 106/66   Pulse 62   Temp 97.9 F (36.6 C) (Tympanic)   Resp 16   Wt 152 lb (68.9 kg)   BMI 26.09 kg/m   Filed Weights   02/04/21 1321  Weight: 152 lb (68.9 kg)   Physical Exam Constitutional:      Comments: Alone.  Walking by self.  HENT:     Head: Normocephalic and atraumatic.     Mouth/Throat:     Pharynx: No oropharyngeal exudate.  Eyes:     Pupils: Pupils are equal, round, and reactive to light.  Cardiovascular:     Rate and Rhythm: Normal rate and regular rhythm.  Pulmonary:     Effort: No respiratory distress.     Breath sounds: No wheezing.  Abdominal:     General: Bowel sounds are normal. There is no distension.     Palpations: Abdomen  is soft. There is no mass.     Tenderness: There is no abdominal tenderness. There is no guarding or rebound.  Musculoskeletal:        General: No tenderness. Normal range of motion.     Cervical back: Normal range of motion and neck supple.  Skin:    General: Skin is warm.  Neurological:     Mental Status: She is alert and oriented to person, place, and time.  Psychiatric:        Mood and Affect: Affect normal.    LABORATORY DATA:  I have reviewed the data as listed    Component Value Date/Time   NA 140 02/04/2021 1257   NA 143 10/13/2017 0920   NA 142 06/19/2011 2227   K 3.7 02/04/2021 1257   K 3.8 06/19/2011 2227   CL 104 02/04/2021 1257   CL 107 06/19/2011 2227   CO2 29 02/04/2021 1257   CO2 21 06/19/2011 2227   GLUCOSE 80 02/04/2021 1257   GLUCOSE 103 (H) 06/19/2011 2227   BUN 11 02/04/2021 1257   BUN 13 10/13/2017 0920   BUN 10 06/19/2011 2227   CREATININE 0.58 02/04/2021 1257   CREATININE 0.75 06/19/2011 2227   CALCIUM 8.8 (L)  02/04/2021 1257   CALCIUM 9.5 06/19/2011 2227   PROT 7.0 02/04/2021 1257   PROT 6.5 10/13/2017 0920   ALBUMIN 4.5 02/04/2021 1257   ALBUMIN 4.6 10/13/2017 0920   AST 29 02/04/2021 1257   ALT 25 02/04/2021 1257   ALKPHOS 47 02/04/2021 1257   BILITOT 0.6 02/04/2021 1257   BILITOT 0.4 10/13/2017 0920   GFRNONAA >60 02/04/2021 1257   GFRNONAA >60 06/19/2011 2227   GFRAA >60 01/24/2020 1247   GFRAA >60 06/19/2011 2227    No results found for: SPEP, UPEP  Lab Results  Component Value Date   WBC 2.9 (L) 02/04/2021   NEUTROABS 1.6 (L) 02/04/2021   HGB 11.9 (L) 02/04/2021   HCT 36.9 02/04/2021   MCV 100.3 (H) 02/04/2021   PLT 214 02/04/2021      Chemistry      Component Value Date/Time   NA 140 02/04/2021 1257   NA 143 10/13/2017 0920   NA 142 06/19/2011 2227   K 3.7 02/04/2021 1257   K 3.8 06/19/2011 2227   CL 104 02/04/2021 1257   CL 107 06/19/2011 2227   CO2 29 02/04/2021 1257   CO2 21 06/19/2011 2227   BUN 11 02/04/2021 1257   BUN 13 10/13/2017 0920   BUN 10 06/19/2011 2227   CREATININE 0.58 02/04/2021 1257   CREATININE 0.75 06/19/2011 2227      Component Value Date/Time   CALCIUM 8.8 (L) 02/04/2021 1257   CALCIUM 9.5 06/19/2011 2227   ALKPHOS 47 02/04/2021 1257   AST 29 02/04/2021 1257   ALT 25 02/04/2021 1257   BILITOT 0.6 02/04/2021 1257   BILITOT 0.4 10/13/2017 0920       RADIOGRAPHIC STUDIES: I have personally reviewed the radiological images as listed and agreed with the findings in the report. No results found.   ASSESSMENT & PLAN:  No problem-specific Assessment & Plan notes found for this encounter.    Orders Placed This Encounter  Procedures  . CBC with Differential    Standing Status:   Future    Standing Expiration Date:   02/04/2022  . Comprehensive metabolic panel    Standing Status:   Future    Standing Expiration Date:   02/04/2022    All  questions were answered. The patient knows to call the clinic with any problems, questions or  concerns.      Cammie Sickle, MD 02/04/2021 2:13 PM

## 2021-02-04 NOTE — Assessment & Plan Note (Signed)
#  Stage I ER PR positive HER-2 negative [HIGH risk mamma print]- on Tamoxifen+ BSO.   # On Tamoxifen [BSO]-tolerating fairly well.  Except for mild hot flashes.  # Hip pain/Left LE pain-G-1- on Tamoxifen-STABLE.   # Hot flashes- G-2-sec to Tamoxifen- STABLE.  # BMD- June 2020- T score-Normal; Continue adjuvant Zometa every 6 months [last 07/18/2020]; repeat Zometa in September 2022; ca-8.8; continue calcium plus vitamin D.  # Leucopenia- ANC 1.6; chronic- STABLE   # Peripheral neuropathy- G-1STABLE   # DISPOSITION # Zometa today # Follow up in 6 months- MD; labs- cbc/cmp-Zometa-- Dr.B

## 2021-08-05 ENCOUNTER — Other Ambulatory Visit: Payer: Self-pay

## 2021-08-05 ENCOUNTER — Inpatient Hospital Stay: Payer: Managed Care, Other (non HMO) | Attending: Internal Medicine

## 2021-08-05 ENCOUNTER — Inpatient Hospital Stay: Payer: Managed Care, Other (non HMO) | Admitting: Nurse Practitioner

## 2021-08-05 ENCOUNTER — Inpatient Hospital Stay: Payer: Managed Care, Other (non HMO)

## 2021-08-05 ENCOUNTER — Encounter: Payer: Self-pay | Admitting: Nurse Practitioner

## 2021-08-05 VITALS — BP 116/62 | HR 68 | Temp 98.1°F | Resp 16 | Ht 64.0 in | Wt 147.8 lb

## 2021-08-05 DIAGNOSIS — Z79899 Other long term (current) drug therapy: Secondary | ICD-10-CM | POA: Diagnosis not present

## 2021-08-05 DIAGNOSIS — G629 Polyneuropathy, unspecified: Secondary | ICD-10-CM | POA: Insufficient documentation

## 2021-08-05 DIAGNOSIS — M25552 Pain in left hip: Secondary | ICD-10-CM | POA: Diagnosis not present

## 2021-08-05 DIAGNOSIS — C50411 Malignant neoplasm of upper-outer quadrant of right female breast: Secondary | ICD-10-CM

## 2021-08-05 DIAGNOSIS — Z170 Estrogen receptor positive status [ER+]: Secondary | ICD-10-CM | POA: Insufficient documentation

## 2021-08-05 DIAGNOSIS — Z90722 Acquired absence of ovaries, bilateral: Secondary | ICD-10-CM | POA: Insufficient documentation

## 2021-08-05 DIAGNOSIS — Z79810 Long term (current) use of selective estrogen receptor modulators (SERMs): Secondary | ICD-10-CM | POA: Diagnosis not present

## 2021-08-05 DIAGNOSIS — Z803 Family history of malignant neoplasm of breast: Secondary | ICD-10-CM | POA: Insufficient documentation

## 2021-08-05 DIAGNOSIS — R232 Flushing: Secondary | ICD-10-CM | POA: Diagnosis not present

## 2021-08-05 DIAGNOSIS — Z5181 Encounter for therapeutic drug level monitoring: Secondary | ICD-10-CM

## 2021-08-05 DIAGNOSIS — D72819 Decreased white blood cell count, unspecified: Secondary | ICD-10-CM | POA: Diagnosis not present

## 2021-08-05 DIAGNOSIS — Z800 Family history of malignant neoplasm of digestive organs: Secondary | ICD-10-CM | POA: Diagnosis not present

## 2021-08-05 DIAGNOSIS — R5383 Other fatigue: Secondary | ICD-10-CM | POA: Insufficient documentation

## 2021-08-05 LAB — CBC WITH DIFFERENTIAL/PLATELET
Abs Immature Granulocytes: 0.01 10*3/uL (ref 0.00–0.07)
Basophils Absolute: 0 10*3/uL (ref 0.0–0.1)
Basophils Relative: 1 %
Eosinophils Absolute: 0 10*3/uL (ref 0.0–0.5)
Eosinophils Relative: 1 %
HCT: 39 % (ref 36.0–46.0)
Hemoglobin: 12.6 g/dL (ref 12.0–15.0)
Immature Granulocytes: 0 %
Lymphocytes Relative: 38 %
Lymphs Abs: 1.3 10*3/uL (ref 0.7–4.0)
MCH: 31.3 pg (ref 26.0–34.0)
MCHC: 32.3 g/dL (ref 30.0–36.0)
MCV: 96.8 fL (ref 80.0–100.0)
Monocytes Absolute: 0.3 10*3/uL (ref 0.1–1.0)
Monocytes Relative: 10 %
Neutro Abs: 1.7 10*3/uL (ref 1.7–7.7)
Neutrophils Relative %: 50 %
Platelets: 220 10*3/uL (ref 150–400)
RBC: 4.03 MIL/uL (ref 3.87–5.11)
RDW: 12.5 % (ref 11.5–15.5)
WBC: 3.4 10*3/uL — ABNORMAL LOW (ref 4.0–10.5)
nRBC: 0 % (ref 0.0–0.2)

## 2021-08-05 LAB — COMPREHENSIVE METABOLIC PANEL
ALT: 21 U/L (ref 0–44)
AST: 27 U/L (ref 15–41)
Albumin: 4.7 g/dL (ref 3.5–5.0)
Alkaline Phosphatase: 47 U/L (ref 38–126)
Anion gap: 8 (ref 5–15)
BUN: 12 mg/dL (ref 6–20)
CO2: 27 mmol/L (ref 22–32)
Calcium: 9.2 mg/dL (ref 8.9–10.3)
Chloride: 103 mmol/L (ref 98–111)
Creatinine, Ser: 0.53 mg/dL (ref 0.44–1.00)
GFR, Estimated: >60 mL/min (ref >60)
Glucose, Bld: 83 mg/dL (ref 70–99)
Potassium: 3.7 mmol/L (ref 3.5–5.1)
Sodium: 138 mmol/L (ref 135–145)
Total Bilirubin: 0.3 mg/dL (ref 0.3–1.2)
Total Protein: 7.2 g/dL (ref 6.5–8.1)

## 2021-08-05 MED ORDER — SODIUM CHLORIDE 0.9 % IV SOLN
Freq: Once | INTRAVENOUS | Status: AC
  Filled 2021-08-05: qty 250

## 2021-08-05 MED ORDER — ZOLEDRONIC ACID 4 MG/100ML IV SOLN
4.0000 mg | Freq: Once | INTRAVENOUS | Status: AC
  Administered 2021-08-05: 4 mg via INTRAVENOUS
  Filled 2021-08-05: qty 100

## 2021-08-05 NOTE — Addendum Note (Signed)
Addended by: Delice Bison E on: 08/05/2021 02:11 PM ? ? Modules accepted: Orders ? ?

## 2021-08-05 NOTE — Progress Notes (Signed)
[place] ? ?Patient Care Team: ?Cletis Athens, MD as PCP - General (Internal Medicine) ?Robert Bellow, MD as Surgeon (General Surgery) ?Rico Junker, RN as Oncology Nurse Navigator ?Noreene Filbert, MD as Referring Physician (Radiation Oncology) ?Cammie Sickle, MD as Consulting Physician (Internal Medicine) ?Lequita Asal, MD (Inactive) as Referring Physician (Hematology and Oncology) ? ? Cancer Staging  ?No matching staging information was found for the patient. ? ? ?Oncology History Overview Branch  ?# May 2019- RIGHT BREAST CA s/p Lumpec & SLNBx [pT1c pN0 (sn); G-1;  margins clear; ER/PR > 90%; Her 2 neu- FISH-NEG. MAMMAPRINT [Dr.Byrnett]- HIGH RISK ? ?# July 29th TC x4 [finished 02/15/2018] s/p RT [dec 11th 2019]; # jan 13th 2020- start Tam; STOPPED in SEP 2020- sec to intolerance [hot flashes] ? ?#Nov 2020-Arimidex [s/p BSO]; STOPPED MARCH 2022- sec to MSK symptoms; MID April 2022- Aromasin 25 mg/day [stopped 3 weeks- STOPPED sec to RASH].  ? ?# MAY 6th, 2022- START TAMOXIFEN [BSO] ? ?# March 9th 2020- Triptorelinq 4q; starting June 2020- switch to q12w; August 2020-BSO; discontinue triptorelin.  ? ?# march 9th 2020- Zometa 4 mg/ adjuvant ? ? ?GENETIC TESTING: [Dr.Byrnett] NEGATIVE FOR - ATM/ BRCA2/CHEK2/PTEN/TP53/BRCA1/CDH1/PALB2/ STK11  ? ?DIAGNOSIS: BREAST CA  ? ?STAGE:  I/mammaprint-H ;GOALS: cure ? ?CURRENT/MOST RECENT THERAPY: aromasin ? ?  ?Malignant neoplasm of upper-outer quadrant of right breast in female, estrogen receptor positive ([place])  ?11/03/2017 Initial Diagnosis  ? Malignant neoplasm of upper-outer quadrant of right breast in female, estrogen receptor positive ([place]) ?  ? ? ?INTERVAL HISTORY: Alone. Ambulating indepdently.  ? ?Crystal Branch 51 y.o.  female pleasant patient above history of stage I breast cancer ER PR positive HER-2/neu negative-BSO -currently on tamoxifen is here for follow-up. No new breast concerns, pain.  ? ?No nausea  no vomiting.  Continues to have mild hot flashes.  No significant weight gain.  No significant mood changes. ? ?Mild joint pains.  No worse.  Ongoing fatigue.  No risk of infections. ? ?Review of Systems  ?Constitutional:  Positive for malaise/fatigue. Negative for chills, diaphoresis, fever and weight loss.  ?HENT:  Negative for nosebleeds and sore throat.   ?Eyes:  Negative for double vision.  ?Respiratory:  Negative for cough, hemoptysis, sputum production, shortness of breath and wheezing.   ?Cardiovascular:  Negative for chest pain, palpitations, orthopnea and leg swelling.  ?Gastrointestinal:  Negative for abdominal pain, blood in stool, constipation, heartburn, melena, nausea and vomiting.  ?Genitourinary:  Negative for dysuria, frequency and urgency.  ?Musculoskeletal:  Positive for joint pain. Negative for back pain.  ?Skin:  Negative for itching.  ?Neurological:  Positive for tingling. Negative for dizziness, focal weakness, weakness and headaches.  ?Endo/Heme/Allergies:  Does not bruise/bleed easily.  ?Psychiatric/Behavioral:  Negative for depression. The patient is not nervous/anxious and does not have insomnia.   ?  ? ?PAST MEDICAL HISTORY :  ?Past Medical History:  ?Diagnosis Date  ? BRCA negative 09/2016  ? [facility]  ? Breast CA ([place]) 09/30/2017  ? 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  Mammoprint: High risk.   ? Dysrhythmia   ? atrial tach/ 2013  ? Personal history of chemotherapy   ? Personal history of radiation therapy   ? ? ?PAST SURGICAL HISTORY :   ?Past Surgical History:  ?Procedure Laterality Date  ? BREAST BIOPSY Right 09/30/2017  ? US guided biopsy, INVASIVE MAMMARY CARCINOMA WITH MUCINOUS FEATURES ER/PR positive  ?  BREAST LUMPECTOMY Right 10/26/2017  ? BREAST LUMPECTOMY WITH SENTINEL LYMPH NODE BIOPSY Right 10/26/2017  ? Procedure: BREAST LUMPECTOMY WITH SENTINEL LYMPH NODE BX;  Surgeon: Robert Bellow, MD;  Location: [facility] ORS;  Service:  General;  Laterality: Right;  ? COLONOSCOPY WITH PROPOFOL N/A 12/01/2018  ? Procedure: COLONOSCOPY WITH PROPOFOL;  Surgeon: Robert Bellow, MD;  [facility]: [facility];  Service: Endoscopy;  Laterality: N/A;  ? LAPAROSCOPIC BILATERAL SALPINGO OOPHERECTOMY N/A 01/06/2019  ? Procedure: LAPAROSCOPIC BILATERAL SALPINGO OOPHORECTOMY;  Surgeon: Will Bonnet, MD;  Location: [facility] ORS;  Service: Gynecology;  Laterality: N/A;  ? TONSILLECTOMY  2015  ? ? ?FAMILY HISTORY :   ?Family History  ?Problem Relation Age of Onset  ? Breast cancer Maternal Aunt 60  ? Breast cancer Maternal Grandmother 50  ? Colon cancer Mother 48  ? ? ?SOCIAL HISTORY:   ?Social History  ? ?Tobacco Use  ? Smoking status: Never  ? Smokeless tobacco: Never  ?Vaping Use  ? Vaping Use: Never used  ?Substance Use Topics  ? Alcohol use: Never  ? Drug use: Never  ? ? ?ALLERGIES:  is allergic to exemestane. ? ?MEDICATIONS:  ?Current Outpatient Medications  ?Medication Sig Dispense Refill  ? acetaminophen (TYLENOL) 650 MG CR tablet Take 1 tablet by mouth 3 (three) times daily. Prn pain    ? aspirin 81 MG chewable tablet Chew 81 mg by mouth daily.    ? Calcium Carb-Cholecalciferol (CALCIUM-VITAMIN D3) 600-500 MG-UNIT CAPS Take 1 capsule by mouth 2 (two) times daily.    ? diclofenac Sodium (VOLTAREN) 1 % GEL Apply 2 g topically 4 (four) times daily.    ? Misc Natural Products (OSTEO BI-FLEX/5-LOXIN ADVANCED PO) Take 1 capsule by mouth 2 (two) times daily.    ? Multiple Vitamin (MULTIVITAMIN WITH MINERALS) TABS tablet Take 1 tablet by mouth daily.    ? Probiotic Product ([place]) CAPS Take 1 capsule by mouth every evening.     ? tamoxifen (NOLVADEX) 20 MG tablet TAKE 1 TABLET BY MOUTH  DAILY 90 tablet 3  ? Vitamin D, Ergocalciferol, (DRISDOL) 1.25 MG (50000 UNIT) CAPS capsule TAKE 1 CAPSULE BY MOUTH 1 TIME A WEEK 12 capsule 1  ? ?No current facility-administered medications for this visit.  ? ? ?PHYSICAL EXAMINATION: ?ECOG PERFORMANCE STATUS:  0 - Asymptomatic ? ?BP 116/62 (BP Location: Left Arm, Patient Position: Sitting, Cuff Size: Normal)   Pulse 68   Temp 98.1 ?F (36.7 ?C) (Tympanic)   Resp 16   Ht 5' 4"  (1.626 m)   Wt 147 lb 12.8 oz (67 kg)   SpO2 100%   BMI 25.37 kg/m?  ? Danley Danker Weights  ? 08/05/21 1306  ?Weight: 147 lb 12.8 oz (67 kg)  ? ?Physical Exam ?Constitutional:   ?   Comments: Alone.  Walking by self.  ?HENT:  ?   Head: Normocephalic and atraumatic.  ?   Mouth/Throat:  ?   Pharynx: No oropharyngeal exudate.  ?Eyes:  ?   Pupils: Pupils are equal, round, and reactive to light.  ?Cardiovascular:  ?   Rate and Rhythm: Normal rate and regular rhythm.  ?Pulmonary:  ?   Effort: No respiratory distress.  ?   Breath sounds: No wheezing.  ?Abdominal:  ?   General: Bowel sounds are normal. There is no distension.  ?   Palpations: Abdomen is soft. There is no mass.  ?   Tenderness: There is no abdominal tenderness. There is no guarding or rebound.  ?  Musculoskeletal:     ?   General: No tenderness. Normal range of motion.  ?   Cervical back: Normal range of motion and neck supple.  ?Skin: ?   General: Skin is warm.  ?Neurological:  ?   Mental Status: She is alert and oriented to person, place, and time.  ?Psychiatric:     ?   Mood and Affect: Affect normal.  ? ? ?LABORATORY DATA:  ?I have reviewed the data as listed ?   ?Component Value Date/Time  ? NA 138 08/05/2021 1232  ? NA 143 10/13/2017 0920  ? NA 142 06/19/2011 2227  ? K 3.7 08/05/2021 1232  ? K 3.8 06/19/2011 2227  ? CL 103 08/05/2021 1232  ? CL 107 06/19/2011 2227  ? CO2 27 08/05/2021 1232  ? CO2 21 06/19/2011 2227  ? GLUCOSE 83 08/05/2021 1232  ? GLUCOSE 103 (H) 06/19/2011 2227  ? BUN 12 08/05/2021 1232  ? BUN 13 10/13/2017 0920  ? BUN 10 06/19/2011 2227  ? CREATININE 0.53 08/05/2021 1232  ? CREATININE 0.75 06/19/2011 2227  ? CALCIUM 9.2 08/05/2021 1232  ? CALCIUM 9.5 06/19/2011 2227  ? PROT 7.2 08/05/2021 1232  ? PROT 6.5 10/13/2017 0920  ? ALBUMIN 4.7 08/05/2021 1232  ? ALBUMIN 4.6  10/13/2017 0920  ? AST 27 08/05/2021 1232  ? ALT 21 08/05/2021 1232  ? ALKPHOS 47 08/05/2021 1232  ? BILITOT 0.3 08/05/2021 1232  ? BILITOT 0.4 10/13/2017 0920  ? GFRNONAA >60 08/05/2021 1232  ? GFRNONAA >60 01/31

## 2021-09-05 ENCOUNTER — Other Ambulatory Visit: Payer: Self-pay | Admitting: Internal Medicine

## 2021-10-01 ENCOUNTER — Other Ambulatory Visit: Payer: Self-pay | Admitting: *Deleted

## 2021-10-01 MED ORDER — VITAMIN D (ERGOCALCIFEROL) 1.25 MG (50000 UNIT) PO CAPS: ORAL_CAPSULE | ORAL | 1 refills | Status: DC

## 2021-10-15 ENCOUNTER — Other Ambulatory Visit: Payer: Self-pay | Admitting: General Surgery

## 2021-10-15 DIAGNOSIS — Z1231 Encounter for screening mammogram for malignant neoplasm of breast: Secondary | ICD-10-CM

## 2021-10-28 ENCOUNTER — Other Ambulatory Visit: Payer: Self-pay | Admitting: General Surgery

## 2021-10-28 DIAGNOSIS — C50411 Malignant neoplasm of upper-outer quadrant of right female breast: Secondary | ICD-10-CM

## 2021-11-06 ENCOUNTER — Ambulatory Visit
Admission: RE | Admit: 2021-11-06 | Discharge: 2021-11-06 | Disposition: A | Discharge status: Home / Self Care | Payer: Managed Care, Other (non HMO) | Source: Ambulatory Visit | Attending: Radiation Oncology | Admitting: Radiation Oncology

## 2021-11-06 VITALS — BP 110/66 | HR 58 | Temp 97.3°F | Resp 18 | Ht 64.0 in | Wt 149.3 lb

## 2021-11-06 DIAGNOSIS — Z170 Estrogen receptor positive status [ER+]: Secondary | ICD-10-CM | POA: Diagnosis not present

## 2021-11-06 DIAGNOSIS — C50411 Malignant neoplasm of upper-outer quadrant of right female breast: Secondary | ICD-10-CM | POA: Insufficient documentation

## 2021-11-06 DIAGNOSIS — Z923 Personal history of irradiation: Secondary | ICD-10-CM | POA: Diagnosis not present

## 2021-11-06 DIAGNOSIS — Z79810 Long term (current) use of selective estrogen receptor modulators (SERMs): Secondary | ICD-10-CM | POA: Insufficient documentation

## 2021-11-06 NOTE — Progress Notes (Signed)
Radiation Oncology Follow up Note  Name: Crystal Branch   Date:   11/06/2021 MRN:  [id number] DOB: June 21, 1970    This 51 y.o. female presents to the clinic today for 3 year follow-up status post whole breast radiation to her right breast for stage I ER/PR positive invasive mammary carcinoma.  REFERRING PROVIDER: Cletis Athens, MD  HPI: Patient is a 51 year old female now out.  3 years having completed whole breast radiation to her right breast for stage I ER/PR positive invasive mammary carcinoma.  Seen today in routine follow-up she is doing well.  She specifically denies breast tenderness cough or bone pain.She had mammograms back in July which I have reviewed were BI-RADS 1 negative.  She has follow-up mammograms which I will review next month.  She is currently on tamoxifen and tolerating that well.  COMPLICATIONS OF TREATMENT: none  FOLLOW UP COMPLIANCE: keeps appointments   PHYSICAL EXAM:  BP 110/66   Pulse (!) 58   Temp (!) 97.3 F (36.3 C)   Resp 18   Ht '5\' 4"'$  (1.626 m)   Wt 149 lb 4.8 oz (67.7 kg)   BMI 25.63 kg/m  Lungs are clear to A&P cardiac examination essentially unremarkable with regular rate and rhythm. No dominant mass or nodularity is noted in either breast in 2 positions examined. Incision is well-healed. No axillary or supraclavicular adenopathy is appreciated. Cosmetic result is excellent.  Well-developed well-nourished patient in NAD. HEENT reveals PERLA, EOMI, discs not visualized.  Oral cavity is clear. No oral mucosal lesions are identified. Neck is clear without evidence of cervical or supraclavicular adenopathy. Lungs are clear to A&P. Cardiac examination is essentially unremarkable with regular rate and rhythm without murmur rub or thrill. Abdomen is benign with no organomegaly or masses noted. Motor sensory and DTR levels are equal and symmetric in the upper and lower extremities. Cranial nerves II through XII are grossly intact. Proprioception is intact. No  peripheral adenopathy or edema is identified. No motor or sensory levels are noted. Crude visual fields are within normal range.  RADIOLOGY RESULTS: Mammograms reviewed compatible with above-stated findings  PLAN: Present time patient is doing well now out 3 years with no evidence of disease.  I am pleased with her overall progress.  I have asked to see her back in 1 year for follow-up and then will discontinue follow-up care.  Patient is to call with any concerns.  I would like to take this opportunity to thank you for allowing me to participate in the care of your patient.Noreene Filbert, MD

## 2021-11-25 ENCOUNTER — Ambulatory Visit
Admission: RE | Admit: 2021-11-25 | Discharge: 2021-11-25 | Disposition: A | Discharge status: Home / Self Care | Payer: Managed Care, Other (non HMO) | Source: Ambulatory Visit | Attending: General Surgery | Admitting: General Surgery

## 2021-11-25 DIAGNOSIS — Z1231 Encounter for screening mammogram for malignant neoplasm of breast: Secondary | ICD-10-CM | POA: Insufficient documentation

## 2021-12-14 ENCOUNTER — Other Ambulatory Visit: Payer: Self-pay | Admitting: Internal Medicine

## 2021-12-17 ENCOUNTER — Encounter: Payer: Self-pay | Admitting: Internal Medicine

## 2022-02-05 ENCOUNTER — Inpatient Hospital Stay: Payer: Managed Care, Other (non HMO)

## 2022-02-05 ENCOUNTER — Other Ambulatory Visit: Payer: Managed Care, Other (non HMO)

## 2022-02-05 ENCOUNTER — Inpatient Hospital Stay: Payer: Managed Care, Other (non HMO) | Attending: Internal Medicine | Admitting: Medical Oncology

## 2022-02-05 ENCOUNTER — Encounter: Payer: Self-pay | Admitting: Medical Oncology

## 2022-02-05 ENCOUNTER — Ambulatory Visit: Payer: Managed Care, Other (non HMO) | Admitting: Internal Medicine

## 2022-02-05 ENCOUNTER — Ambulatory Visit: Payer: Managed Care, Other (non HMO)

## 2022-02-05 VITALS — BP 117/64 | HR 62 | Temp 97.5°F | Ht 64.0 in | Wt 146.0 lb

## 2022-02-05 DIAGNOSIS — Z79810 Long term (current) use of selective estrogen receptor modulators (SERMs): Secondary | ICD-10-CM | POA: Diagnosis not present

## 2022-02-05 DIAGNOSIS — Z170 Estrogen receptor positive status [ER+]: Secondary | ICD-10-CM | POA: Insufficient documentation

## 2022-02-05 DIAGNOSIS — G620 Drug-induced polyneuropathy: Secondary | ICD-10-CM

## 2022-02-05 DIAGNOSIS — C50411 Malignant neoplasm of upper-outer quadrant of right female breast: Secondary | ICD-10-CM | POA: Insufficient documentation

## 2022-02-05 DIAGNOSIS — Z5181 Encounter for therapeutic drug level monitoring: Secondary | ICD-10-CM

## 2022-02-05 DIAGNOSIS — Z800 Family history of malignant neoplasm of digestive organs: Secondary | ICD-10-CM | POA: Diagnosis not present

## 2022-02-05 DIAGNOSIS — Z7983 Long term (current) use of bisphosphonates: Secondary | ICD-10-CM | POA: Insufficient documentation

## 2022-02-05 DIAGNOSIS — M898X8 Other specified disorders of bone, other site: Secondary | ICD-10-CM

## 2022-02-05 DIAGNOSIS — Z9221 Personal history of antineoplastic chemotherapy: Secondary | ICD-10-CM | POA: Insufficient documentation

## 2022-02-05 DIAGNOSIS — T451X5A Adverse effect of antineoplastic and immunosuppressive drugs, initial encounter: Secondary | ICD-10-CM

## 2022-02-05 DIAGNOSIS — Z923 Personal history of irradiation: Secondary | ICD-10-CM | POA: Diagnosis not present

## 2022-02-05 DIAGNOSIS — Z803 Family history of malignant neoplasm of breast: Secondary | ICD-10-CM | POA: Insufficient documentation

## 2022-02-05 DIAGNOSIS — Z90722 Acquired absence of ovaries, bilateral: Secondary | ICD-10-CM | POA: Insufficient documentation

## 2022-02-05 LAB — COMPREHENSIVE METABOLIC PANEL
ALT: 19 U/L (ref 0–44)
AST: 22 U/L (ref 15–41)
Albumin: 4.6 g/dL (ref 3.5–5.0)
Alkaline Phosphatase: 53 U/L (ref 38–126)
Anion gap: 5 (ref 5–15)
BUN: 19 mg/dL (ref 6–20)
CO2: 29 mmol/L (ref 22–32)
Calcium: 9.4 mg/dL (ref 8.9–10.3)
Chloride: 105 mmol/L (ref 98–111)
Creatinine, Ser: 0.45 mg/dL (ref 0.44–1.00)
GFR, Estimated: >60 mL/min (ref >60)
Glucose, Bld: 93 mg/dL (ref 70–99)
Potassium: 4 mmol/L (ref 3.5–5.1)
Sodium: 139 mmol/L (ref 135–145)
Total Bilirubin: 0.3 mg/dL (ref 0.3–1.2)
Total Protein: 7.2 g/dL (ref 6.5–8.1)

## 2022-02-05 LAB — CBC WITH DIFFERENTIAL/PLATELET
Abs Immature Granulocytes: 0.01 10*3/uL (ref 0.00–0.07)
Basophils Absolute: 0 10*3/uL (ref 0.0–0.1)
Basophils Relative: 1 %
Eosinophils Absolute: 0.1 10*3/uL (ref 0.0–0.5)
Eosinophils Relative: 1 %
HCT: 40.2 % (ref 36.0–46.0)
Hemoglobin: 13.4 g/dL (ref 12.0–15.0)
Immature Granulocytes: 0 %
Lymphocytes Relative: 29 %
Lymphs Abs: 1.3 10*3/uL (ref 0.7–4.0)
MCH: 32.1 pg (ref 26.0–34.0)
MCHC: 33.3 g/dL (ref 30.0–36.0)
MCV: 96.2 fL (ref 80.0–100.0)
Monocytes Absolute: 0.3 10*3/uL (ref 0.1–1.0)
Monocytes Relative: 7 %
Neutro Abs: 2.8 10*3/uL (ref 1.7–7.7)
Neutrophils Relative %: 62 %
Platelets: 255 10*3/uL (ref 150–400)
RBC: 4.18 MIL/uL (ref 3.87–5.11)
RDW: 11.8 % (ref 11.5–15.5)
WBC: 4.5 10*3/uL (ref 4.0–10.5)
nRBC: 0 % (ref 0.0–0.2)

## 2022-02-05 MED ORDER — SODIUM CHLORIDE 0.9 % IV SOLN
Freq: Once | INTRAVENOUS | Status: AC
  Filled 2022-02-05: qty 250

## 2022-02-05 MED ORDER — ZOLEDRONIC ACID 4 MG/100ML IV SOLN
4.0000 mg | Freq: Once | INTRAVENOUS | Status: AC
  Administered 2022-02-05: 4 mg via INTRAVENOUS
  Filled 2022-02-05: qty 100

## 2022-02-05 NOTE — Progress Notes (Signed)
[facility] OFFICE PROGRESS NOTE  Patient Care Team: Cletis Athens, MD as PCP - General (Internal Medicine) Bary Castilla, Forest Gleason, MD as Surgeon (General Surgery) Rico Junker, RN as Oncology Nurse Navigator Noreene Filbert, MD as Referring Physician (Radiation Oncology) Cammie Sickle, MD as Consulting Physician (Internal Medicine) Lequita Asal, MD (Inactive) as Referring Physician (Hematology and Oncology)   Cancer Staging  No matching staging information was found for the patient.   Oncology History Overview Note  # May 2019- RIGHT BREAST CA s/p Lumpec & SLNBx [pT1c pN0 (sn); G-1;  margins clear; ER/PR > 90%; Her 2 neu- FISH-NEG. MAMMAPRINT [Dr.Byrnett]- HIGH RISK  # July 29th TC x4 [finished 02/15/2018] s/p RT [dec 11th 2019]; # jan 13th 2020- start Tam; STOPPED in SEP 2020- sec to intolerance [hot flashes]  #Nov 2020-Arimidex [s/p BSO]; STOPPED MARCH 2022- sec to MSK symptoms; MID April 2022- Aromasin 25 mg/day [stopped 3 weeks- STOPPED sec to RASH].   # MAY 6th, 2022- START TAMOXIFEN [BSO]  # March 9th 2020- Triptorelinq 4q; starting June 2020- switch to q12w; August 2020-BSO; discontinue triptorelin.   # march 9th 2020- Zometa 4 mg/ adjuvant   GENETIC TESTING: [Dr.Byrnett] NEGATIVE FOR - ATM/ BRCA2/CHEK2/PTEN/TP53/BRCA1/CDH1/PALB2/ STK11   DIAGNOSIS: BREAST CA   STAGE:  I/mammaprint-H ;GOALS: cure  CURRENT/MOST RECENT THERAPY: aromasin    Malignant neoplasm of upper-outer quadrant of right breast in female, estrogen receptor positive ([place])  11/03/2017 Initial Diagnosis   Malignant neoplasm of upper-outer quadrant of right breast in female, estrogen receptor positive ([place])     INTERVAL HISTORY: Alone. Ambulating indepdently.   [facility] 51 y.o.  female pleasant patient above history of stage I breast cancer ER PR positive HER-2/neu negative-BSO -currently on tamoxifen is here for follow-up. No new breast concerns, pain.   Patient  reports that overall she is doing well. Has some occasional hot flashes and night sweats due to the Tamoxifen but reports it is much better than previous medications. No breast changes, masses, discharge, unintentional weight loss.   Mild joint pains that are chronic and unchanged.   Review of Systems  Constitutional:  Positive for malaise/fatigue. Negative for chills, diaphoresis, fever and weight loss.  HENT:  Negative for nosebleeds and sore throat.   Eyes:  Negative for double vision.  Respiratory:  Negative for cough, hemoptysis, sputum production, shortness of breath and wheezing.   Cardiovascular:  Negative for chest pain, palpitations, orthopnea and leg swelling.  Gastrointestinal:  Negative for abdominal pain, blood in stool, constipation, heartburn, melena, nausea and vomiting.  Genitourinary:  Negative for dysuria, frequency and urgency.  Musculoskeletal:  Positive for joint pain. Negative for back pain.  Skin:  Negative for itching.  Neurological:  Positive for tingling. Negative for dizziness, focal weakness, weakness and headaches.  Endo/Heme/Allergies:  Does not bruise/bleed easily.  Psychiatric/Behavioral:  Negative for depression. The patient is not nervous/anxious and does not have insomnia.       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  Mammoprint: High risk.    Dysrhythmia    atrial tach/ 2013   Personal history of chemotherapy    Personal history of radiation therapy     PAST SURGICAL HISTORY :   Past Surgical History:  Procedure Laterality Date   BREAST BIOPSY Right 09/30/2017  US guided biopsy, INVASIVE MAMMARY CARCINOMA WITH MUCINOUS FEATURES ER/PR positive   BREAST LUMPECTOMY Right 10/26/2017   BREAST LUMPECTOMY WITH SENTINEL LYMPH NODE BIOPSY Right 10/26/2017   Procedure: BREAST  LUMPECTOMY WITH SENTINEL LYMPH NODE BX;  Surgeon: Robert Bellow, MD;  Location: [facility] OR[facility];  Service: General;  Laterality: Right;   COLONOSCOPY WITH PROPOFOL N/A 12/01/2018   Procedure: COLONOSCOPY WITH PROPOFOL;  Surgeon: Robert Bellow, MD;  Location: [facility];  Service: Endoscopy;  Laterality: N/A;   LAPAROSCOPIC BILATERAL SALPINGO OOPHERECTOMY N/A 01/06/2019   Procedure: LAPAROSCOPIC BILATERAL SALPINGO OOPHORECTOMY;  Surgeon: Will Bonnet, MD;  Location: [facility] OR[facility];  Service: Gynecology;  Laterality: N/A;   TONSILLECTOMY  2015    FAMILY HISTORY :   Family History  Problem Relation Age of Onset   Breast cancer Maternal Aunt 60   Breast cancer Maternal Grandmother 65   Colon cancer Mother 32    SOCIAL HISTORY:   Social History   Tobacco Use   Smoking status: Never   Smokeless tobacco: Never  Vaping Use   Vaping Use: Never used  Substance Use Topics   Alcohol use: Never   Drug use: Never    ALLERGIES:  is allergic to exemestane.  MEDICATIONS:  Current Outpatient Medications  Medication Sig Dispense Refill   acetaminophen (TYLENOL) 650 MG CR tablet Take 1 tablet by mouth 3 (three) times daily. Prn pain     aspirin 81 MG chewable tablet Chew 81 mg by mouth daily.     Calcium Carb-Cholecalciferol (CALCIUM-VITAMIN D3) 600-500 MG-UNIT CAPS Take 1 capsule by mouth 2 (two) times daily.     Misc Natural Products (OSTEO BI-FLEX/5-LOXIN ADVANCED PO) Take 1 capsule by mouth 2 (two) times daily.     Multiple Vitamin (MULTIVITAMIN WITH MINERALS) TABS tablet Take 1 tablet by mouth daily.     Probiotic Product ([place]) CAPS Take 1 capsule by mouth every evening.      tamoxifen (NOLVADEX) 20 MG tablet TAKE 1 TABLET BY MOUTH  DAILY 90 tablet 3   Vitamin D, Ergocalciferol, (DRISDOL) 1.25 MG (50000 UNIT) CAPS capsule TAKE 1 CAPSULE BY MOUTH ONCE A WEEK 12 capsule 1   diclofenac Sodium (VOLTAREN) 1 % GEL Apply 2 g topically 4 (four) times daily. (Patient not  taking: Reported on 11/06/2021)     No current facility-administered medications for this visit.    PHYSICAL EXAMINATION: ECOG PERFORMANCE STATUS: 0 - Asymptomatic  BP 117/64 (BP Location: Left Arm, Patient Position: Sitting)   Pulse 62   Temp (!) 97.5 F (36.4 C) (Tympanic)   Ht 5' 4"  (1.626 m)   Wt 146 lb (66.2 kg)   BMI 25.06 kg/m   Filed Weights   02/05/22 1303  Weight: 146 lb (66.2 kg)   Physical Exam Constitutional:      Comments: Alone.  Walking by self.  HENT:     Head: Normocephalic and atraumatic.     Mouth/Throat:     Pharynx: No oropharyngeal exudate.  Eyes:     Pupils: Pupils are equal, round, and reactive to light.  Cardiovascular:     Rate and Rhythm: Normal rate and regular rhythm.  Pulmonary:     Effort: No respiratory distress.     Breath sounds: No wheezing.  Abdominal:     General: Bowel sounds are normal. There is no distension.     Palpations: Abdomen is soft. There is no mass.     Tenderness: There is no abdominal tenderness. There is no  guarding or rebound.  Musculoskeletal:        General: No tenderness. Normal range of motion.     Cervical back: Normal range of motion and neck supple.  Skin:    General: Skin is warm.  Neurological:     Mental Status: She is alert and oriented to person, place, and time.  Psychiatric:        Mood and Affect: Affect normal.     LABORATORY DATA:  I have reviewed the data as listed    Component Value Date/Time   NA 139 02/05/2022 1234   NA 143 10/13/2017 0920   NA 142 06/19/2011 2227   K 4.0 02/05/2022 1234   K 3.8 06/19/2011 2227   CL 105 02/05/2022 1234   CL 107 06/19/2011 2227   CO2 29 02/05/2022 1234   CO2 21 06/19/2011 2227   GLUCOSE 93 02/05/2022 1234   GLUCOSE 103 (H) 06/19/2011 2227   BUN 19 02/05/2022 1234   BUN 13 10/13/2017 0920   BUN 10 06/19/2011 2227   CREATININE 0.45 02/05/2022 1234   CREATININE 0.75 06/19/2011 2227   CALCIUM 9.4 02/05/2022 1234   CALCIUM 9.5 06/19/2011 2227    PROT 7.2 02/05/2022 1234   PROT 6.5 10/13/2017 0920   ALBUMIN 4.6 02/05/2022 1234   ALBUMIN 4.6 10/13/2017 0920   AST 22 02/05/2022 1234   ALT 19 02/05/2022 1234   ALKPHOS 53 02/05/2022 1234   BILITOT 0.3 02/05/2022 1234   BILITOT 0.4 10/13/2017 0920   GFRNONAA >60 02/05/2022 1234   GFRNONAA >60 06/19/2011 2227   GFRAA >60 01/24/2020 1247   GFRAA >60 06/19/2011 2227    No results found for: "SPEP", "UPEP"  Lab Results  Component Value Date   WBC 4.5 02/05/2022   NEUTROABS 2.8 02/05/2022   HGB 13.4 02/05/2022   HCT 40.2 02/05/2022   MCV 96.2 02/05/2022   PLT 255 02/05/2022      Chemistry      Component Value Date/Time   NA 139 02/05/2022 1234   NA 143 10/13/2017 0920   NA 142 06/19/2011 2227   K 4.0 02/05/2022 1234   K 3.8 06/19/2011 2227   CL 105 02/05/2022 1234   CL 107 06/19/2011 2227   CO2 29 02/05/2022 1234   CO2 21 06/19/2011 2227   BUN 19 02/05/2022 1234   BUN 13 10/13/2017 0920   BUN 10 06/19/2011 2227   CREATININE 0.45 02/05/2022 1234   CREATININE 0.75 06/19/2011 2227      Component Value Date/Time   CALCIUM 9.4 02/05/2022 1234   CALCIUM 9.5 06/19/2011 2227   ALKPHOS 53 02/05/2022 1234   AST 22 02/05/2022 1234   ALT 19 02/05/2022 1234   BILITOT 0.3 02/05/2022 1234   BILITOT 0.4 10/13/2017 0920       RADIOGRAPHIC STUDIES: I have personally reviewed the radiological images as listed and agreed with the findings in the report. No results found.   ASSESSMENT & PLAN:  No problem-specific Assessment & Plan notes found for this encounter.  Encounter Diagnoses  Name Primary?   Malignant neoplasm of upper-outer quadrant of right breast in female, estrogen receptor positive ([place]) Yes   Encounter for monitoring tamoxifen therapy    Iliac crest bone pain    Peripheral neuropathy due to chemotherapy ([place])     #Stage I ER PR positive HER-2 negative [HIGH risk mamma print]- on Tamoxifen+ BSO. Mammograms with Dr. Bary Castilla. Last July 2022 was birads  cat 1: negative. Repeat in July 2023.    #  On Tamoxifen [BSO]-tolerating fairly well.  Except for mild hot flashes which she reports are tolerable. Continue. We reviewed her last mammogram from 11/25/2021 which was Bi-RADS 1. 6 month follow up   # Hip pain/Left LE pain-G-1- on Tamoxifen-STABLE.    # Hot flashes- G-2-sec to Tamoxifen- STABLE.   # BMD- June 2020- T score-Normal; Continue adjuvant Zometa every 6 months [last 07/18/2020]; repeat Zometa today. Continue calcium and vitamin d.Due for her DEXA which I reminded her of today. She will have this completed prior to her follow up visit with [place] in 6 months.    # Leukopenia- Resolved. Monitor     # Peripheral neuropathy- G-1STABLE    # DISPOSITION # Zometa today # bone density scan # Follow up in 6 months- MD; labs- cbc/cmp-Zometa-- [place]   No orders of the defined types were placed in this encounter.   All questions were answered. The patient knows to call the clinic with any problems, questions or concerns.      Hughie Closs, PA-C 02/05/2022 1:36 PM

## 2022-04-14 ENCOUNTER — Telehealth: Payer: Self-pay | Admitting: *Deleted

## 2022-04-14 NOTE — Telephone Encounter (Signed)
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 He is requesting a return call.

## 2022-04-16 ENCOUNTER — Encounter: Payer: Self-pay | Admitting: Internal Medicine

## 2022-04-16 NOTE — Telephone Encounter (Signed)
Called and left a voicemail for [facility]-discussed patient's concerns for [facility] retinopathy. GB

## 2022-04-17 ENCOUNTER — Encounter: Payer: Self-pay | Admitting: Internal Medicine

## 2022-04-17 NOTE — Telephone Encounter (Signed)
I spoke to patient regarding the concerns for retinitis from tamoxifen.  I left a message for Randall Hiss at [facility] ophthalmology.  Offered alternative AI-letrozole [previous intolerance to anastrazole/aromasin]- pt declined futhrer endocrine therapy.   Pt will follow up as planned- GB

## 2022-05-24 ENCOUNTER — Other Ambulatory Visit: Payer: Self-pay | Admitting: Internal Medicine

## 2022-07-21 ENCOUNTER — Ambulatory Visit
Admission: RE | Admit: 2022-07-21 | Discharge: 2022-07-21 | Disposition: A | Discharge status: Home / Self Care | Payer: Managed Care, Other (non HMO) | Source: Ambulatory Visit | Attending: Nurse Practitioner | Admitting: Nurse Practitioner

## 2022-07-21 DIAGNOSIS — Z170 Estrogen receptor positive status [ER+]: Secondary | ICD-10-CM | POA: Diagnosis present

## 2022-07-21 DIAGNOSIS — C50411 Malignant neoplasm of upper-outer quadrant of right female breast: Secondary | ICD-10-CM | POA: Diagnosis not present

## 2022-07-21 DIAGNOSIS — Z5181 Encounter for therapeutic drug level monitoring: Secondary | ICD-10-CM | POA: Insufficient documentation

## 2022-07-21 DIAGNOSIS — Z79810 Long term (current) use of selective estrogen receptor modulators (SERMs): Secondary | ICD-10-CM | POA: Insufficient documentation

## 2022-08-05 ENCOUNTER — Other Ambulatory Visit: Payer: Self-pay

## 2022-08-05 DIAGNOSIS — Z170 Estrogen receptor positive status [ER+]: Secondary | ICD-10-CM

## 2022-08-06 ENCOUNTER — Inpatient Hospital Stay: Payer: Managed Care, Other (non HMO)

## 2022-08-06 ENCOUNTER — Inpatient Hospital Stay: Payer: Managed Care, Other (non HMO) | Admitting: Internal Medicine

## 2022-08-06 ENCOUNTER — Inpatient Hospital Stay: Payer: Managed Care, Other (non HMO) | Attending: Internal Medicine

## 2022-08-06 ENCOUNTER — Encounter: Payer: Self-pay | Admitting: Internal Medicine

## 2022-08-06 VITALS — BP 111/69 | HR 72 | Temp 97.6°F | Resp 16 | Wt 146.8 lb

## 2022-08-06 DIAGNOSIS — Z170 Estrogen receptor positive status [ER+]: Secondary | ICD-10-CM

## 2022-08-06 DIAGNOSIS — Z90722 Acquired absence of ovaries, bilateral: Secondary | ICD-10-CM | POA: Insufficient documentation

## 2022-08-06 DIAGNOSIS — Z9221 Personal history of antineoplastic chemotherapy: Secondary | ICD-10-CM | POA: Diagnosis not present

## 2022-08-06 DIAGNOSIS — Z923 Personal history of irradiation: Secondary | ICD-10-CM | POA: Diagnosis not present

## 2022-08-06 DIAGNOSIS — C50411 Malignant neoplasm of upper-outer quadrant of right female breast: Secondary | ICD-10-CM | POA: Diagnosis present

## 2022-08-06 DIAGNOSIS — Z79811 Long term (current) use of aromatase inhibitors: Secondary | ICD-10-CM | POA: Diagnosis not present

## 2022-08-06 LAB — CBC WITH DIFFERENTIAL (CANCER CENTER ONLY)
Abs Immature Granulocytes: 0 10*3/uL (ref 0.00–0.07)
Basophils Absolute: 0 10*3/uL (ref 0.0–0.1)
Basophils Relative: 1 %
Eosinophils Absolute: 0 10*3/uL (ref 0.0–0.5)
Eosinophils Relative: 1 %
HCT: 30.6 % — ABNORMAL LOW (ref 36.0–46.0)
Hemoglobin: 10 g/dL — ABNORMAL LOW (ref 12.0–15.0)
Immature Granulocytes: 0 %
Lymphocytes Relative: 30 %
Lymphs Abs: 1 10*3/uL (ref 0.7–4.0)
MCH: 32.3 pg (ref 26.0–34.0)
MCHC: 32.7 g/dL (ref 30.0–36.0)
MCV: 98.7 fL (ref 80.0–100.0)
Monocytes Absolute: 0.3 10*3/uL (ref 0.1–1.0)
Monocytes Relative: 9 %
Neutro Abs: 1.9 10*3/uL (ref 1.7–7.7)
Neutrophils Relative %: 59 %
Platelet Count: 225 10*3/uL (ref 150–400)
RBC: 3.1 MIL/uL — ABNORMAL LOW (ref 3.87–5.11)
RDW: 12 % (ref 11.5–15.5)
WBC Count: 3.3 10*3/uL — ABNORMAL LOW (ref 4.0–10.5)
nRBC: 0 % (ref 0.0–0.2)

## 2022-08-06 LAB — CMP (CANCER CENTER ONLY)
ALT: 20 U/L (ref 0–44)
AST: 22 U/L (ref 15–41)
Albumin: 4.3 g/dL (ref 3.5–5.0)
Alkaline Phosphatase: 48 U/L (ref 38–126)
Anion gap: 4 — ABNORMAL LOW (ref 5–15)
BUN: 18 mg/dL (ref 6–20)
CO2: 28 mmol/L (ref 22–32)
Calcium: 8.6 mg/dL — ABNORMAL LOW (ref 8.9–10.3)
Chloride: 103 mmol/L (ref 98–111)
Creatinine: 0.57 mg/dL (ref 0.44–1.00)
GFR, Estimated: >60 mL/min (ref >60)
Glucose, Bld: 98 mg/dL (ref 70–99)
Potassium: 3.9 mmol/L (ref 3.5–5.1)
Sodium: 135 mmol/L (ref 135–145)
Total Bilirubin: 0.3 mg/dL (ref 0.3–1.2)
Total Protein: 6.5 g/dL (ref 6.5–8.1)

## 2022-08-06 LAB — FERRITIN: Ferritin: 13 ng/mL (ref 11–307)

## 2022-08-06 LAB — IRON AND TIBC
Iron: 85 ug/dL (ref 28–170)
Saturation Ratios: 23 % (ref 10.4–31.8)
TIBC: 377 ug/dL (ref 250–450)
UIBC: 292 ug/dL

## 2022-08-06 LAB — LACTATE DEHYDROGENASE: LDH: 113 U/L (ref 98–192)

## 2022-08-06 MED ORDER — LETROZOLE 2.5 MG PO TABS: 2.5000 mg | ORAL_TABLET | Freq: Every day | ORAL | 4 refills | Status: DC

## 2022-08-06 NOTE — Assessment & Plan Note (Addendum)
#  Stage I ER PR positive HER-2 negative [HIGH risk mamma print]-most recently on tamoxifen+ BSO.  However tamoxifen stopped November 2024-Serous retinitis.  Patient has been on endocrine therapy since January 2021.  # Discussed importance of continued endocrine therapy for total of 5 years at least if not longer 10 years given the need for chemotherapy/high risk MammaPrint.   # Patient previously tried-poorly tolerated-anastrozole/exemestane.  Discussed regarding use of Femara.  Again discussed the potential side effects including but not limited to joint pains/hot flashes/osteoporosis.  See below-bone health.  New prescription sent.  # Hip pain/Left LE pain-G-1-  stable.   # Hot flashes-postmenopausal status/BSO-stable.  # BMD- June 2020- T score-Normal; BMD -March 2024= T-score -0.3.  On adjuvant Zometa every 6 months.  Today calcium is 8.5.  Hold Zometa.   # Peripheral neuropathy- G-1- stable  # Intermittent leukopenia/neutropenia-stable.  However mild anemia- Hb 10 ? Etiology; last colonoscopy- 2022.  Check iron studies/LDH.  Will follow-up in 3 months.  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  Discussed with ophthalmology.  Patient currently discontinued tamoxifen.  Monitor for now.   # DISPOSITION # add iron studies;ferritin; LDH # HOLD Zometa today # Follow up in 3 months- MD; labs- cbc/cmp-Zometa- Dr.B

## 2022-08-06 NOTE — Progress Notes (Signed)
[place] OFFICE PROGRESS NOTE  Patient Care Team: Crystal Athens, MD as PCP - General (Internal Medicine) Bary Castilla, Forest Gleason, MD as Surgeon (General Surgery) Rico Junker, RN as Oncology Nurse Navigator Noreene Filbert, MD as Referring Physician (Radiation Oncology) Cammie Sickle, MD as Consulting Physician (Internal Medicine) Lequita Asal, MD (Inactive) as Referring Physician (Hematology and Oncology)   Cancer Staging  No matching staging information was found for the patient.   Oncology History Overview Note  # May 2019- RIGHT BREAST CA s/p Lumpec & SLNBx [pT1c pN0 (sn); G-1;  margins clear; ER/PR > 90%; Her 2 neu- FISH-NEG. MAMMAPRINT [Dr.Byrnett]- HIGH RISK  # July 29th TC x4 [finished 02/15/2018] s/p RT [dec 11th 2019]; # jan 13th 2020- start Tam; STOPPED in SEP 2020- sec to intolerance [hot flashes]  #Nov 2020-Arimidex [s/p BSO]; STOPPED MARCH 2022- sec to MSK symptoms; MID April 2022- Aromasin 25 mg/day [stopped 3 weeks- STOPPED sec to RASH].   # MAY 6th, 2022- START TAMOXIFEN [BSO]; stopped in NOV 2024- [serous retinitis]  # March 20th, 2024- start Letrozole.   # March 9th 2020- Triptorelinq 4q; starting June 2020- switch to q12w; August 2020-BSO; discontinue triptorelin.   # march 9th 2020- Zometa 4 mg/ adjuvant   GENETIC TESTING: [Dr.Byrnett] NEGATIVE FOR - ATM/ BRCA2/CHEK2/PTEN/TP53/BRCA1/CDH1/PALB2/ STK11   DIAGNOSIS: BREAST CA   STAGE:  I/mammaprint-H ;GOALS: cure  CURRENT/MOST RECENT THERAPY: aromasin    Malignant neoplasm of upper-outer quadrant of right breast in female, estrogen receptor positive ([name])  11/03/2017 Initial Diagnosis   Malignant neoplasm of upper-outer quadrant of right breast in female, estrogen receptor positive ([name])     INTERVAL HISTORY: Patient ambulating-independently.  Alone.    Crystal Branch 52 y.o.  female pleasant patient above history of stage I breast cancer ER PR positive HER-2/neu  negative-BSO -currently on tamoxifen is here for follow-up.  Not taking Tamoxifen as advised by [facility] due to early retinopathy.  No changes in vision.   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    No nausea no vomiting.  Continues to have mild hot flashes.  No significant weight gain.  No significant mood changes.  Review of Systems  Constitutional:  Positive for malaise/fatigue. Negative for chills, diaphoresis, fever and weight loss.  HENT:  Negative for nosebleeds and sore throat.   Eyes:  Negative for double vision.  Respiratory:  Negative for cough, hemoptysis, sputum production, shortness of breath and wheezing.   Cardiovascular:  Negative for chest pain, palpitations, orthopnea and leg swelling.  Gastrointestinal:  Negative for abdominal pain, blood in stool, constipation, heartburn, melena, nausea and vomiting.  Genitourinary:  Negative for dysuria, frequency and urgency.  Musculoskeletal:  Positive for joint pain. Negative for back pain.  Skin:  Negative for itching.  Neurological:  Positive for tingling. Negative for dizziness, focal weakness, weakness and headaches.  Endo/Heme/Allergies:  Does not bruise/bleed easily.  Psychiatric/Behavioral:  Negative for depression. The patient is not nervous/anxious and does not have insomnia.       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  Mammoprint: High risk.    Dysrhythmia    atrial tach/ 2013   Personal history of chemotherapy    Personal history of radiation therapy     PAST SURGICAL HISTORY :  Past Surgical History:  Procedure Laterality Date   BREAST BIOPSY Right 09/30/2017   US guided biopsy, INVASIVE MAMMARY CARCINOMA WITH MUCINOUS FEATURES ER/PR positive   BREAST LUMPECTOMY Right 10/26/2017   BREAST  LUMPECTOMY WITH SENTINEL LYMPH NODE BIOPSY Right 10/26/2017   Procedure: BREAST LUMPECTOMY WITH SENTINEL LYMPH NODE BX;  Surgeon: Robert Bellow, MD;  Location: [facility] ORS;  Service: General;  Laterality: Right;   COLONOSCOPY WITH PROPOFOL N/A 12/01/2018   Procedure: COLONOSCOPY WITH PROPOFOL;  Surgeon: Robert Bellow, MD;  Location: [facility] ENDOSCOPY;  Service: Endoscopy;  Laterality: N/A;   LAPAROSCOPIC BILATERAL SALPINGO OOPHERECTOMY N/A 01/06/2019   Procedure: LAPAROSCOPIC BILATERAL SALPINGO OOPHORECTOMY;  Surgeon: Will Bonnet, MD;  Location: [facility] ORS;  Service: Gynecology;  Laterality: N/A;   TONSILLECTOMY  2015    FAMILY HISTORY :   Family History  Problem Relation Age of Onset   Breast cancer Maternal Aunt 60   Breast cancer Maternal Grandmother 78   Colon cancer Mother 6    SOCIAL HISTORY:   Social History   Tobacco Use   Smoking status: Never   Smokeless tobacco: Never  Vaping Use   Vaping Use: Never used  Substance Use Topics   Alcohol use: Never   Drug use: Never    ALLERGIES:  is allergic to exemestane.  MEDICATIONS:  Current Outpatient Medications  Medication Sig Dispense Refill   acetaminophen (TYLENOL) 650 MG CR tablet Take 1 tablet by mouth 3 (three) times daily. Prn pain     aspirin 81 MG chewable tablet Chew 81 mg by mouth daily.     Calcium Carb-Cholecalciferol (CALCIUM-VITAMIN D3) 600-500 MG-UNIT CAPS Take 1 capsule by mouth 2 (two) times daily.     letrozole (FEMARA) 2.5 MG tablet Take 1 tablet (2.5 mg total) by mouth daily. 30 tablet 4   Misc Natural Products (OSTEO BI-FLEX/5-LOXIN ADVANCED PO) Take 1 capsule by mouth 2 (two) times daily.     Multiple Vitamin (MULTIVITAMIN WITH MINERALS) TABS tablet Take 1 tablet by mouth daily.     Probiotic Product ([place]) CAPS Take 1 capsule by mouth every evening.      Vitamin D, Ergocalciferol, (DRISDOL) 1.25 MG (50000 UNIT) CAPS capsule TAKE 1 CAPSULE BY MOUTH 1 TIME A WEEK 12 capsule 1    diclofenac Sodium (VOLTAREN) 1 % GEL Apply 2 g topically 4 (four) times daily. (Patient not taking: Reported on 11/06/2021)     tamoxifen (NOLVADEX) 20 MG tablet TAKE 1 TABLET BY MOUTH  DAILY (Patient not taking: Reported on 08/06/2022) 90 tablet 3   No current facility-administered medications for this visit.    PHYSICAL EXAMINATION: ECOG PERFORMANCE STATUS: 0 - Asymptomatic  BP 111/69 (BP Location: Left Arm, Patient Position: Sitting)   Pulse 72   Temp 97.6 F (36.4 C) (Tympanic)   Resp 16   Wt 146 lb 12.8 oz (66.6 kg)   BMI 25.20 kg/m   Filed Weights   08/06/22 1400  Weight: 146 lb 12.8 oz (66.6 kg)   Physical Exam Constitutional:      Comments: Alone.  Walking by self.  HENT:     Head: Normocephalic and atraumatic.     Mouth/Throat:     Pharynx: No oropharyngeal exudate.  Eyes:     Pupils: Pupils are equal, round, and reactive to light.  Cardiovascular:     Rate and Rhythm: Normal rate and regular rhythm.  Pulmonary:     Effort: No respiratory distress.     Breath sounds: No  wheezing.  Abdominal:     General: Bowel sounds are normal. There is no distension.     Palpations: Abdomen is soft. There is no mass.     Tenderness: There is no abdominal tenderness. There is no guarding or rebound.  Musculoskeletal:        General: No tenderness. Normal range of motion.     Cervical back: Normal range of motion and neck supple.  Skin:    General: Skin is warm.  Neurological:     Mental Status: She is alert and oriented to person, place, and time.  Psychiatric:        Mood and Affect: Affect normal.     LABORATORY DATA:  I have reviewed the data as listed    Component Value Date/Time   NA 135 08/06/2022 1419   NA 143 10/13/2017 0920   NA 142 06/19/2011 2227   K 3.9 08/06/2022 1419   K 3.8 06/19/2011 2227   CL 103 08/06/2022 1419   CL 107 06/19/2011 2227   CO2 28 08/06/2022 1419   CO2 21 06/19/2011 2227   GLUCOSE 98 08/06/2022 1419   GLUCOSE 103 (H) 06/19/2011  2227   BUN 18 08/06/2022 1419   BUN 13 10/13/2017 0920   BUN 10 06/19/2011 2227   CREATININE 0.57 08/06/2022 1419   CREATININE 0.75 06/19/2011 2227   CALCIUM 8.6 (L) 08/06/2022 1419   CALCIUM 9.5 06/19/2011 2227   PROT 6.5 08/06/2022 1419   PROT 6.5 10/13/2017 0920   ALBUMIN 4.3 08/06/2022 1419   ALBUMIN 4.6 10/13/2017 0920   AST 22 08/06/2022 1419   ALT 20 08/06/2022 1419   ALKPHOS 48 08/06/2022 1419   BILITOT 0.3 08/06/2022 1419   GFRNONAA >60 08/06/2022 1419   GFRNONAA >60 06/19/2011 2227   GFRAA >60 01/24/2020 1247   GFRAA >60 06/19/2011 2227    No results found for: "SPEP", "UPEP"  Lab Results  Component Value Date   WBC 3.3 (L) 08/06/2022   NEUTROABS 1.9 08/06/2022   HGB 10.0 (L) 08/06/2022   HCT 30.6 (L) 08/06/2022   MCV 98.7 08/06/2022   PLT 225 08/06/2022      Chemistry      Component Value Date/Time   NA 135 08/06/2022 1419   NA 143 10/13/2017 0920   NA 142 06/19/2011 2227   K 3.9 08/06/2022 1419   K 3.8 06/19/2011 2227   CL 103 08/06/2022 1419   CL 107 06/19/2011 2227   CO2 28 08/06/2022 1419   CO2 21 06/19/2011 2227   BUN 18 08/06/2022 1419   BUN 13 10/13/2017 0920   BUN 10 06/19/2011 2227   CREATININE 0.57 08/06/2022 1419   CREATININE 0.75 06/19/2011 2227      Component Value Date/Time   CALCIUM 8.6 (L) 08/06/2022 1419   CALCIUM 9.5 06/19/2011 2227   ALKPHOS 48 08/06/2022 1419   AST 22 08/06/2022 1419   ALT 20 08/06/2022 1419   BILITOT 0.3 08/06/2022 1419       RADIOGRAPHIC STUDIES: I have personally reviewed the radiological images as listed and agreed with the findings in the report. No results found.   ASSESSMENT & PLAN:  Malignant neoplasm of upper-outer quadrant of right breast in female, estrogen receptor positive ([place] [address]) #Stage I ER PR positive HER-2 negative [HIGH risk mamma print]-most recently on tamoxifen+ BSO.  However tamoxifen stopped November 2024-Serous retinitis.  Patient has been on endocrine therapy since January  2021.  # Discussed importance of continued endocrine therapy for total  of 5 years at least if not longer 10 years given the need for chemotherapy/high risk MammaPrint.   # Patient previously tried-poorly tolerated-anastrozole/exemestane.  Discussed regarding use of Femara.  Again discussed the potential side effects including but not limited to joint pains/hot flashes/osteoporosis.  See below-bone health.  New prescription sent.  # Hip pain/Left LE pain-G-1-  stable.   # Hot flashes-postmenopausal status/BSO-stable.  # BMD- June 2020- T score-Normal; BMD -March 2024= T-score -0.3.  On adjuvant Zometa every 6 months.  Today calcium is 8.5.  Hold Zometa.   # Peripheral neuropathy- G-1- stable  # Intermittent leukopenia/neutropenia-stable.  However mild anemia- Hb 10 ? Etiology; last colonoscopy- 2022.  Check iron studies/LDH.  Will follow-up in 3 months.  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  Discussed with ophthalmology.  Patient currently discontinued tamoxifen.  Monitor for now.   # DISPOSITION # add iron studies;ferritin; LDH # HOLD Zometa today # Follow up in 3 months- MD; labs- cbc/cmp-Zometa- Dr.B    Orders Placed This Encounter  Procedures   Ferritin    Standing Status:   Future    Number of Occurrences:   1    Standing Expiration Date:   08/06/2023   Lactate dehydrogenase    Standing Status:   Future    Number of Occurrences:   1    Standing Expiration Date:   08/06/2023   Iron and TIBC    Standing Status:   Future    Number of Occurrences:   1    Standing Expiration Date:   08/06/2023   CBC with Differential ([place] Only)    Standing Status:   Future    Standing Expiration Date:   07/15/2023   CMP ([place] only)    Standing Status:   Future    Standing Expiration Date:   08/06/2023    All questions were answered. The patient knows to call the clinic with any problems, questions or concerns.      Cammie Sickle, MD 08/06/2022 4:30 PM

## 2022-08-06 NOTE — Progress Notes (Signed)
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

## 2022-10-08 ENCOUNTER — Other Ambulatory Visit: Payer: Self-pay | Admitting: Surgery

## 2022-10-08 DIAGNOSIS — Z1231 Encounter for screening mammogram for malignant neoplasm of breast: Secondary | ICD-10-CM

## 2022-11-06 ENCOUNTER — Encounter: Payer: Self-pay | Admitting: Nurse Practitioner

## 2022-11-06 ENCOUNTER — Inpatient Hospital Stay: Payer: Managed Care, Other (non HMO) | Attending: Internal Medicine

## 2022-11-06 ENCOUNTER — Inpatient Hospital Stay: Payer: Managed Care, Other (non HMO)

## 2022-11-06 ENCOUNTER — Inpatient Hospital Stay: Payer: Managed Care, Other (non HMO) | Admitting: Nurse Practitioner

## 2022-11-06 VITALS — BP 121/50 | HR 50 | Temp 98.0°F | Wt 143.0 lb

## 2022-11-06 DIAGNOSIS — Z79811 Long term (current) use of aromatase inhibitors: Secondary | ICD-10-CM | POA: Diagnosis not present

## 2022-11-06 DIAGNOSIS — M25552 Pain in left hip: Secondary | ICD-10-CM | POA: Diagnosis not present

## 2022-11-06 DIAGNOSIS — R232 Flushing: Secondary | ICD-10-CM | POA: Insufficient documentation

## 2022-11-06 DIAGNOSIS — Z800 Family history of malignant neoplasm of digestive organs: Secondary | ICD-10-CM | POA: Diagnosis not present

## 2022-11-06 DIAGNOSIS — Z90722 Acquired absence of ovaries, bilateral: Secondary | ICD-10-CM | POA: Insufficient documentation

## 2022-11-06 DIAGNOSIS — G629 Polyneuropathy, unspecified: Secondary | ICD-10-CM | POA: Insufficient documentation

## 2022-11-06 DIAGNOSIS — Z7982 Long term (current) use of aspirin: Secondary | ICD-10-CM | POA: Diagnosis not present

## 2022-11-06 DIAGNOSIS — Z7983 Long term (current) use of bisphosphonates: Secondary | ICD-10-CM | POA: Diagnosis not present

## 2022-11-06 DIAGNOSIS — D72819 Decreased white blood cell count, unspecified: Secondary | ICD-10-CM | POA: Diagnosis not present

## 2022-11-06 DIAGNOSIS — Z5181 Encounter for therapeutic drug level monitoring: Secondary | ICD-10-CM | POA: Diagnosis not present

## 2022-11-06 DIAGNOSIS — D649 Anemia, unspecified: Secondary | ICD-10-CM | POA: Insufficient documentation

## 2022-11-06 DIAGNOSIS — Z170 Estrogen receptor positive status [ER+]: Secondary | ICD-10-CM | POA: Insufficient documentation

## 2022-11-06 DIAGNOSIS — Z9221 Personal history of antineoplastic chemotherapy: Secondary | ICD-10-CM | POA: Insufficient documentation

## 2022-11-06 DIAGNOSIS — C50411 Malignant neoplasm of upper-outer quadrant of right female breast: Secondary | ICD-10-CM | POA: Diagnosis present

## 2022-11-06 DIAGNOSIS — Z79899 Other long term (current) drug therapy: Secondary | ICD-10-CM | POA: Insufficient documentation

## 2022-11-06 DIAGNOSIS — Z923 Personal history of irradiation: Secondary | ICD-10-CM | POA: Insufficient documentation

## 2022-11-06 DIAGNOSIS — Z803 Family history of malignant neoplasm of breast: Secondary | ICD-10-CM | POA: Insufficient documentation

## 2022-11-06 LAB — CBC WITH DIFFERENTIAL (CANCER CENTER ONLY)
Abs Immature Granulocytes: 0 10*3/uL (ref 0.00–0.07)
Basophils Absolute: 0 10*3/uL (ref 0.0–0.1)
Basophils Relative: 1 %
Eosinophils Absolute: 0.1 10*3/uL (ref 0.0–0.5)
Eosinophils Relative: 2 %
HCT: 37.8 % (ref 36.0–46.0)
Hemoglobin: 12.6 g/dL (ref 12.0–15.0)
Immature Granulocytes: 0 %
Lymphocytes Relative: 39 %
Lymphs Abs: 1.1 10*3/uL (ref 0.7–4.0)
MCH: 31.9 pg (ref 26.0–34.0)
MCHC: 33.3 g/dL (ref 30.0–36.0)
MCV: 95.7 fL (ref 80.0–100.0)
Monocytes Absolute: 0.3 10*3/uL (ref 0.1–1.0)
Monocytes Relative: 11 %
Neutro Abs: 1.4 10*3/uL — ABNORMAL LOW (ref 1.7–7.7)
Neutrophils Relative %: 47 %
Platelet Count: 223 10*3/uL (ref 150–400)
RBC: 3.95 MIL/uL (ref 3.87–5.11)
RDW: 11.6 % (ref 11.5–15.5)
WBC Count: 2.9 10*3/uL — ABNORMAL LOW (ref 4.0–10.5)
nRBC: 0 % (ref 0.0–0.2)

## 2022-11-06 LAB — CMP (CANCER CENTER ONLY)
ALT: 18 U/L (ref 0–44)
AST: 22 U/L (ref 15–41)
Albumin: 4.4 g/dL (ref 3.5–5.0)
Alkaline Phosphatase: 48 U/L (ref 38–126)
Anion gap: 10 (ref 5–15)
BUN: 13 mg/dL (ref 6–20)
CO2: 28 mmol/L (ref 22–32)
Calcium: 9.8 mg/dL (ref 8.9–10.3)
Chloride: 103 mmol/L (ref 98–111)
Creatinine: 0.65 mg/dL (ref 0.44–1.00)
GFR, Estimated: >60 mL/min (ref >60)
Glucose, Bld: 75 mg/dL (ref 70–99)
Potassium: 4.6 mmol/L (ref 3.5–5.1)
Sodium: 141 mmol/L (ref 135–145)
Total Bilirubin: 0.7 mg/dL (ref 0.3–1.2)
Total Protein: 6.9 g/dL (ref 6.5–8.1)

## 2022-11-06 MED ORDER — ZOLEDRONIC ACID 4 MG/100ML IV SOLN
4.0000 mg | Freq: Once | INTRAVENOUS | Status: AC
  Administered 2022-11-06: 4 mg via INTRAVENOUS
  Filled 2022-11-06: qty 100

## 2022-11-06 MED ORDER — SODIUM CHLORIDE 0.9 % IV SOLN
Freq: Once | INTRAVENOUS | Status: AC
  Filled 2022-11-06: qty 250

## 2022-11-06 NOTE — Patient Instructions (Signed)
[place] [facility] [place] [facility]  Discharge Instructions: Thank you for choosing [facility] to provide your oncology and hematology care.  If you have a lab appointment with the Cancer Center, please go directly to the Cancer Center and check in at the registration area.  Wear comfortable clothing and clothing appropriate for easy access to any Portacath or PICC line.   We strive to give you quality time with your provider. You may need to reschedule your appointment if you arrive late (15 or more minutes).  Arriving late affects you and other patients whose appointments are after yours.  Also, if you miss three or more appointments without notifying the office, you may be dismissed from the clinic at the provider's discretion.      For prescription refill requests, have your pharmacy contact our office and allow 72 hours for refills to be completed.    Today you received the following chemotherapy and/or immunotherapy agents Zometa      To help prevent nausea and vomiting after your treatment, we encourage you to take your nausea medication as directed.  BELOW ARE SYMPTOMS THAT SHOULD BE REPORTED IMMEDIATELY: *FEVER GREATER THAN 100.4 F (38 C) OR HIGHER *CHILLS OR SWEATING *NAUSEA AND VOMITING THAT IS NOT CONTROLLED WITH YOUR NAUSEA MEDICATION *UNUSUAL SHORTNESS OF BREATH *UNUSUAL BRUISING OR BLEEDING *URINARY PROBLEMS (pain or burning when urinating, or frequent urination) *BOWEL PROBLEMS (unusual diarrhea, constipation, pain near the anus) TENDERNESS IN MOUTH AND THROAT WITH OR WITHOUT PRESENCE OF ULCERS (sore throat, sores in mouth, or a toothache) UNUSUAL RASH, SWELLING OR PAIN  UNUSUAL VAGINAL DISCHARGE OR ITCHING   Items with * indicate a potential emergency and should be followed up as soon as possible or go to the Emergency Department if any problems should occur.  Please show the CHEMOTHERAPY ALERT CARD or IMMUNOTHERAPY ALERT CARD at check-in to the  Emergency Department and triage nurse.  Should you have questions after your visit or need to cancel or reschedule your appointment, please contact [facility] [place] [facility]  [phone] and follow the prompts.  Office hours are 8:00 a.m. to 4:30 p.m. Monday - Friday. Please note that voicemails left after 4:00 p.m. may not be returned until the following business day.  We are closed weekends and major holidays. You have access to a nurse at all times for urgent questions. Please call the main number to the clinic [phone] and follow the prompts.  For any non-urgent questions, you may also contact your provider using MyChart. We now offer e-Visits for anyone 18 and older to request care online for non-urgent symptoms. For details visit [email or web address].   Also download the MyChart app! Go to the app store, search "MyChart", open the app, select , and log in with your MyChart username and password.    

## 2022-11-06 NOTE — Progress Notes (Signed)
[facility] OFFICE PROGRESS NOTE  Patient Care Team: Corky Downs, MD as PCP - General (Internal Medicine) Lemar Livings, Merrily Pew, MD as Surgeon (General Surgery) Jim Like, RN as Oncology Nurse Navigator [name], MD as Referring Physician (Radiation Oncology) Earna Coder, MD as Consulting Physician (Internal Medicine) Rosey Bath, MD (Inactive) as Referring Physician (Hematology and Oncology)   Cancer Staging  No matching staging information was found for the patient.   Oncology History Overview Note  # May 2019- RIGHT BREAST CA s/p Lumpec & SLNBx [pT1c pN0 (sn); G-1;  margins clear; ER/PR > 90%; Her 2 neu- FISH-NEG. MAMMAPRINT [Dr.Byrnett]- HIGH RISK  # July 29th TC x4 [finished 02/15/2018] s/p RT [dec 11th 2019]; # jan 13th 2020- start Tam; STOPPED in SEP 2020- sec to intolerance [hot flashes]  #Nov 2020-Arimidex [s/p BSO]; STOPPED MARCH 2022- sec to MSK symptoms; MID April 2022- Aromasin 25 mg/day [stopped 3 weeks- STOPPED sec to RASH].   # MAY 6th, 2022- START TAMOXIFEN [BSO]; stopped in NOV 2024- [serous retinitis]  # March 20th, 2024- start Letrozole.   # March 9th 2020- Triptorelinq 4q; starting June 2020- switch to q12w; August 2020-BSO; discontinue triptorelin.   # march 9th 2020- Zometa 4 mg/ adjuvant   GENETIC TESTING: [Dr.Byrnett] NEGATIVE FOR - ATM/ BRCA2/CHEK2/PTEN/TP53/BRCA1/CDH1/PALB2/ STK11   DIAGNOSIS: BREAST CA   STAGE:  I/mammaprint-H ;GOALS: cure  CURRENT/MOST RECENT THERAPY: aromasin    Malignant neoplasm of upper-outer quadrant of right breast in female, estrogen receptor positive (HCC)  11/03/2017 Initial Diagnosis   Malignant neoplasm of upper-outer quadrant of right breast in female, estrogen receptor positive (HCC)     INTERVAL HISTORY: Patient ambulating-independently.  Alone.   Crystal Branch 52 y.o.  female pleasant patient with above history of stage I breast cancer, ER/PR positive, HER-2/neu  negative, s/p BSO, currently on letrozole, returns to clinic for follow up. At last visit, zometa was held d/t low calcium. She is taking calcium and vitamin D. Denies additional vision changes. She feels well and denies complaints.    Review of Systems  Constitutional:  Positive for malaise/fatigue. Negative for chills, diaphoresis, fever and weight loss.  HENT:  Negative for nosebleeds and sore throat.   Eyes:  Negative for double vision.  Respiratory:  Negative for cough, hemoptysis, sputum production, shortness of breath and wheezing.   Cardiovascular:  Negative for chest pain, palpitations, orthopnea and leg swelling.  Gastrointestinal:  Negative for abdominal pain, blood in stool, constipation, heartburn, melena, nausea and vomiting.  Genitourinary:  Negative for dysuria, frequency and urgency.  Musculoskeletal:  Positive for joint pain. Negative for back pain.  Skin:  Negative for itching.  Neurological:  Positive for tingling. Negative for dizziness, focal weakness, weakness and headaches.  Endo/Heme/Allergies:  Does not bruise/bleed easily.  Psychiatric/Behavioral:  Negative for depression. The patient is not nervous/anxious and does not have insomnia.      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  Mammoprint: High risk.    Dysrhythmia    atrial tach/ 2013   Personal history of chemotherapy    Personal history of radiation therapy     PAST SURGICAL HISTORY :   Past Surgical History:  Procedure Laterality Date   BREAST BIOPSY Right 09/30/2017   US guided biopsy, INVASIVE MAMMARY CARCINOMA WITH MUCINOUS  FEATURES ER/PR positive   BREAST LUMPECTOMY Right 10/26/2017   BREAST LUMPECTOMY WITH SENTINEL LYMPH NODE BIOPSY Right 10/26/2017   Procedure: BREAST LUMPECTOMY WITH SENTINEL LYMPH NODE BX;  Surgeon:  Earline Mayotte, MD;  Location: [facility] ORS;  Service: General;  Laterality: Right;   COLONOSCOPY WITH PROPOFOL N/A 12/01/2018   Procedure: COLONOSCOPY WITH PROPOFOL;  Surgeon: Earline Mayotte, MD;  Location: [facility];  Service: Endoscopy;  Laterality: N/A;   LAPAROSCOPIC BILATERAL SALPINGO OOPHERECTOMY N/A 01/06/2019   Procedure: LAPAROSCOPIC BILATERAL SALPINGO OOPHORECTOMY;  Surgeon: Conard Novak, MD;  Location: [facility] ORS;  Service: Gynecology;  Laterality: N/A;   TONSILLECTOMY  2015    FAMILY HISTORY :   Family History  Problem Relation Age of Onset   Breast cancer Maternal Aunt 60   Breast cancer Maternal Grandmother 10   Colon cancer Mother 42    SOCIAL HISTORY:   Social History   Tobacco Use   Smoking status: Never   Smokeless tobacco: Never  Vaping Use   Vaping Use: Never used  Substance Use Topics   Alcohol use: Never   Drug use: Never    ALLERGIES:  is allergic to exemestane.  MEDICATIONS:  Current Outpatient Medications  Medication Sig Dispense Refill   acetaminophen (TYLENOL) 650 MG CR tablet Take 1 tablet by mouth 3 (three) times daily. Prn pain     aspirin 81 MG chewable tablet Chew 81 mg by mouth daily.     Calcium Carb-Cholecalciferol (CALCIUM-VITAMIN D3) 600-500 MG-UNIT CAPS Take 1 capsule by mouth 2 (two) times daily.     letrozole (FEMARA) 2.5 MG tablet Take 1 tablet (2.5 mg total) by mouth daily. 30 tablet 4   Misc Natural Products (OSTEO BI-FLEX/5-LOXIN ADVANCED PO) Take 1 capsule by mouth 2 (two) times daily.     Multiple Vitamin (MULTIVITAMIN WITH MINERALS) TABS tablet Take 1 tablet by mouth daily.     Probiotic Product (PHILLIPS COLON HEALTH) CAPS Take 1 capsule by mouth every evening.      Vitamin D, Ergocalciferol, (DRISDOL) 1.25 MG (50000 UNIT) CAPS capsule TAKE 1 CAPSULE BY MOUTH 1 TIME A WEEK 12 capsule 1   diclofenac Sodium (VOLTAREN) 1 % GEL Apply 2 g topically 4 (four) times daily. (Patient not taking: Reported on 11/06/2021)      tamoxifen (NOLVADEX) 20 MG tablet TAKE 1 TABLET BY MOUTH  DAILY (Patient not taking: Reported on 08/06/2022) 90 tablet 3   No current facility-administered medications for this visit.    PHYSICAL EXAMINATION: ECOG PERFORMANCE STATUS: 0 - Asymptomatic  BP (!) 121/50 (BP Location: Left Arm, Patient Position: Sitting)   Pulse (!) 50   Temp 98 F (36.7 C) (Tympanic)   Wt 143 lb (64.9 kg)   SpO2 100%   BMI 24.55 kg/m   Filed Weights   11/06/22 1018  Weight: 143 lb (64.9 kg)   Physical Exam Constitutional:      Appearance: She is not ill-appearing.     Comments: Alone.  Walking by self.  HENT:     Head: Normocephalic and atraumatic.  Pulmonary:     Effort: No respiratory distress.  Abdominal:     Tenderness: There is no guarding.  Musculoskeletal:        General: No deformity.  Skin:    General: Skin is warm and dry.  Neurological:     Mental Status: She is alert and oriented to person, place, and time.  Psychiatric:        Mood and Affect: Mood  and affect normal.        Behavior: Behavior normal.     LABORATORY DATA:  I have reviewed the data as listed    Component Value Date/Time   NA 141 11/06/2022 1009   NA 143 10/13/2017 0920   NA 142 06/19/2011 2227   K 4.6 11/06/2022 1009   K 3.8 06/19/2011 2227   CL 103 11/06/2022 1009   CL 107 06/19/2011 2227   CO2 28 11/06/2022 1009   CO2 21 06/19/2011 2227   GLUCOSE 75 11/06/2022 1009   GLUCOSE 103 (H) 06/19/2011 2227   BUN 13 11/06/2022 1009   BUN 13 10/13/2017 0920   BUN 10 06/19/2011 2227   CREATININE 0.65 11/06/2022 1009   CREATININE 0.75 06/19/2011 2227   CALCIUM 9.8 11/06/2022 1009   CALCIUM 9.5 06/19/2011 2227   PROT 6.9 11/06/2022 1009   PROT 6.5 10/13/2017 0920   ALBUMIN 4.4 11/06/2022 1009   ALBUMIN 4.6 10/13/2017 0920   AST 22 11/06/2022 1009   ALT 18 11/06/2022 1009   ALKPHOS 48 11/06/2022 1009   BILITOT 0.7 11/06/2022 1009   GFRNONAA >60 11/06/2022 1009   GFRNONAA >60 06/19/2011 2227   GFRAA  >60 01/24/2020 1247   GFRAA >60 06/19/2011 2227   Lab Results  Component Value Date   WBC 2.9 (L) 11/06/2022   NEUTROABS 1.4 (L) 11/06/2022   HGB 12.6 11/06/2022   HCT 37.8 11/06/2022   MCV 95.7 11/06/2022   PLT 223 11/06/2022   RADIOGRAPHIC STUDIES: I have personally reviewed the radiological images as listed and agreed with the findings in the report. No results found.   ASSESSMENT & PLAN:   #Stage I ER PR positive HER-2 negative [HIGH risk mamma print]- most recently on tamoxifen + BSO. However tamoxifen stopped November 2024-Serous retinitis.  Patient has been on endocrine therapy since January 2021. She had high risk mammaprint and endocrine therapy for at least 5 years, preferred 10 years, was recommended. She tolerated anastrozole and exemestane poorly and previously declined additional treatment. Saw Dr Donneta Romberg in March 2024 and agreed to trial of letrozole. Tolerating well with stable joint pains. Mammograms per surgery. Continue letrozole.    # Hip pain/Left LE pain-G-1-  stable.    # Hot flashes-postmenopausal status post BSO-stable.   # BMD- June 2020- T score-Normal; BMD -March 2024= T-score -0.3.  On adjuvant Zometa every 6 months. March Zometa held d/t calcium 8.5. Started calcium 1200 mg and vitamin D 1000 units. Today, calcium 9.8. Proceed with zometa.    # Peripheral neuropathy- G-1- stable   # Intermittent leukopenia/neutropenia- stable  # anemia. Mild. March 2024- Hmg 10. Now, hemoglobin 12.6. Ferritin and iron studies are normal. LDH normal. Last colonoscopy 2022. Follow up in 6 months.    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  Discontinued tamoxifen.  Monitor for now.    DISPOSITION Zometa today 6 mo- lab (cbc, cmp), Dr Donneta Romberg, +/- zometa- la  No problem-specific Assessment & Plan notes found for this encounter.  No orders of the defined types were placed in this encounter.  All questions were answered. The patient knows to call the clinic  with any problems, questions or concerns.    Alinda Dooms, NP 11/06/2022

## 2022-11-10 ENCOUNTER — Other Ambulatory Visit: Payer: Self-pay | Admitting: Internal Medicine

## 2022-11-12 ENCOUNTER — Ambulatory Visit
Admission: RE | Admit: 2022-11-12 | Discharge: 2022-11-12 | Disposition: A | Discharge status: Home / Self Care | Payer: Managed Care, Other (non HMO) | Source: Ambulatory Visit | Attending: Radiation Oncology | Admitting: Radiation Oncology

## 2022-11-12 ENCOUNTER — Encounter: Payer: Self-pay | Admitting: Radiation Oncology

## 2022-11-12 VITALS — BP 127/70 | HR 51 | Temp 98.0°F | Resp 16 | Wt 147.0 lb

## 2022-11-12 DIAGNOSIS — Z79811 Long term (current) use of aromatase inhibitors: Secondary | ICD-10-CM | POA: Diagnosis not present

## 2022-11-12 DIAGNOSIS — C50411 Malignant neoplasm of upper-outer quadrant of right female breast: Secondary | ICD-10-CM | POA: Insufficient documentation

## 2022-11-12 DIAGNOSIS — Z170 Estrogen receptor positive status [ER+]: Secondary | ICD-10-CM | POA: Diagnosis not present

## 2022-11-12 DIAGNOSIS — Z923 Personal history of irradiation: Secondary | ICD-10-CM | POA: Diagnosis not present

## 2022-11-12 NOTE — Progress Notes (Signed)
Radiation Oncology Follow up Note  Name: Crystal Branch   Date:   11/12/2022 MRN:  [id number] DOB: 04-13-1971    This 52 y.o. female presents to the clinic today for 3-year follow-up status post whole breast radiation to her right breast for stage I ER/PR positive invasive mammary carcinoma.  REFERRING PROVIDER: Corky Downs, MD  HPI: Patient is a 52 year old female now out over 3 years having completed radiation therapy to her right breast for stage I ER/PR positive invasive mammary carcinoma.  Seen today in routine follow-up she is doing well.  She specifically denies breast tenderness cough or bone pain.Marland Kitchen  Her mammogram back in July was BI-RADS Category 1 negative.  She is scheduled for another mammogram next month.  She is currently on Femara tolerating it well without side effect.  COMPLICATIONS OF TREATMENT: none  FOLLOW UP COMPLIANCE: keeps appointments   PHYSICAL EXAM:  BP 127/70   Pulse (!) 51   Temp 98 F (36.7 C) (Tympanic)   Resp 16   Wt 147 lb (66.7 kg)   BMI 25.23 kg/m lungs are clear to A&P cardiac examination essentially unremarkable with regular rate and rhythm. No dominant mass or nodularity is noted in either breast in 2 positions examined. Incision is well-healed. No axillary or supraclavicular adenopathy is appreciated. Cosmetic result is excellent. Well-developed well-nourished patient in NAD. HEENT reveals PERLA, EOMI, discs not visualized.  Oral cavity is clear. No oral mucosal lesions are identified. Neck is clear without evidence of cervical or supraclavicular adenopathy. Lungs are clear to A&P. Cardiac examination is essentially unremarkable with regular rate and rhythm without murmur rub or thrill. Abdomen is benign with no organomegaly or masses noted. Motor sensory and DTR levels are equal and symmetric in the upper and lower extremities. Cranial nerves II through XII are grossly intact. Proprioception is intact. No peripheral adenopathy or edema is identified. No  motor or sensory levels are noted. Crude visual fields are within normal range.  RADIOLOGY RESULTS: No current films for review  PLAN: Present time patient is doing well now out over 3 and half years.  I will turn follow-up care over to medical oncology and surgery.  Be happy to reevaluate the patient in time the future should that be indicated.  Patient knows to call with any concerns.  I would like to take this opportunity to thank you for allowing me to participate in the care of your patient.Crystal Miller, MD

## 2022-11-14 ENCOUNTER — Ambulatory Visit: Payer: Managed Care, Other (non HMO) | Admitting: Radiation Oncology

## 2022-11-24 ENCOUNTER — Other Ambulatory Visit: Payer: Self-pay | Admitting: Internal Medicine

## 2022-11-28 ENCOUNTER — Ambulatory Visit
Admission: RE | Admit: 2022-11-28 | Discharge: 2022-11-28 | Disposition: A | Discharge status: Home / Self Care | Payer: Managed Care, Other (non HMO) | Source: Ambulatory Visit | Attending: Surgery | Admitting: Surgery

## 2022-11-28 DIAGNOSIS — Z1231 Encounter for screening mammogram for malignant neoplasm of breast: Secondary | ICD-10-CM | POA: Diagnosis not present

## 2023-04-28 ENCOUNTER — Other Ambulatory Visit: Payer: Self-pay | Admitting: Internal Medicine

## 2023-04-28 DIAGNOSIS — C50411 Malignant neoplasm of upper-outer quadrant of right female breast: Secondary | ICD-10-CM

## 2023-04-28 NOTE — Progress Notes (Signed)
Added vit D levels for 12/20 GB

## 2023-05-07 ENCOUNTER — Other Ambulatory Visit: Payer: Self-pay | Admitting: *Deleted

## 2023-05-07 DIAGNOSIS — Z170 Estrogen receptor positive status [ER+]: Secondary | ICD-10-CM

## 2023-05-08 ENCOUNTER — Inpatient Hospital Stay: Payer: Managed Care, Other (non HMO) | Admitting: Internal Medicine

## 2023-05-08 ENCOUNTER — Inpatient Hospital Stay: Payer: Managed Care, Other (non HMO)

## 2023-05-08 ENCOUNTER — Encounter: Payer: Self-pay | Admitting: Internal Medicine

## 2023-05-08 ENCOUNTER — Inpatient Hospital Stay: Payer: Managed Care, Other (non HMO) | Attending: Internal Medicine

## 2023-05-08 VITALS — BP 111/61 | HR 60 | Temp 97.5°F | Ht 64.0 in | Wt 144.2 lb

## 2023-05-08 DIAGNOSIS — Z170 Estrogen receptor positive status [ER+]: Secondary | ICD-10-CM | POA: Diagnosis not present

## 2023-05-08 DIAGNOSIS — Z9221 Personal history of antineoplastic chemotherapy: Secondary | ICD-10-CM | POA: Diagnosis not present

## 2023-05-08 DIAGNOSIS — Z923 Personal history of irradiation: Secondary | ICD-10-CM | POA: Insufficient documentation

## 2023-05-08 DIAGNOSIS — C50411 Malignant neoplasm of upper-outer quadrant of right female breast: Secondary | ICD-10-CM | POA: Diagnosis not present

## 2023-05-08 DIAGNOSIS — Z79811 Long term (current) use of aromatase inhibitors: Secondary | ICD-10-CM | POA: Insufficient documentation

## 2023-05-08 DIAGNOSIS — M25552 Pain in left hip: Secondary | ICD-10-CM | POA: Diagnosis not present

## 2023-05-08 LAB — CBC WITH DIFFERENTIAL (CANCER CENTER ONLY)
Abs Immature Granulocytes: 0.01 10*3/uL (ref 0.00–0.07)
Basophils Absolute: 0 10*3/uL (ref 0.0–0.1)
Basophils Relative: 1 %
Eosinophils Absolute: 0 10*3/uL (ref 0.0–0.5)
Eosinophils Relative: 1 %
HCT: 36.9 % (ref 36.0–46.0)
Hemoglobin: 12.2 g/dL (ref 12.0–15.0)
Immature Granulocytes: 0 %
Lymphocytes Relative: 36 %
Lymphs Abs: 1.1 10*3/uL (ref 0.7–4.0)
MCH: 32.9 pg (ref 26.0–34.0)
MCHC: 33.1 g/dL (ref 30.0–36.0)
MCV: 99.5 fL (ref 80.0–100.0)
Monocytes Absolute: 0.3 10*3/uL (ref 0.1–1.0)
Monocytes Relative: 10 %
Neutro Abs: 1.5 10*3/uL — ABNORMAL LOW (ref 1.7–7.7)
Neutrophils Relative %: 52 %
Platelet Count: 232 10*3/uL (ref 150–400)
RBC: 3.71 MIL/uL — ABNORMAL LOW (ref 3.87–5.11)
RDW: 11.7 % (ref 11.5–15.5)
WBC Count: 2.9 10*3/uL — ABNORMAL LOW (ref 4.0–10.5)
nRBC: 0 % (ref 0.0–0.2)

## 2023-05-08 LAB — CMP (CANCER CENTER ONLY)
ALT: 20 U/L (ref 0–44)
AST: 22 U/L (ref 15–41)
Albumin: 4.6 g/dL (ref 3.5–5.0)
Alkaline Phosphatase: 41 U/L (ref 38–126)
Anion gap: 8 (ref 5–15)
BUN: 14 mg/dL (ref 6–20)
CO2: 27 mmol/L (ref 22–32)
Calcium: 9.3 mg/dL (ref 8.9–10.3)
Chloride: 102 mmol/L (ref 98–111)
Creatinine: 0.63 mg/dL (ref 0.44–1.00)
GFR, Estimated: >60 mL/min (ref >60)
Glucose, Bld: 86 mg/dL (ref 70–99)
Potassium: 4.3 mmol/L (ref 3.5–5.1)
Sodium: 137 mmol/L (ref 135–145)
Total Bilirubin: 0.7 mg/dL (ref <1.2)
Total Protein: 6.9 g/dL (ref 6.5–8.1)

## 2023-05-08 LAB — VITAMIN D 25 HYDROXY (VIT D DEFICIENCY, FRACTURES): Vit D, 25-Hydroxy: 113.09 ng/mL — ABNORMAL HIGH (ref 30–100)

## 2023-05-08 NOTE — Progress Notes (Signed)
Having tenderness and soreness around incision, has been ongoing, no worse, comes and goes.

## 2023-05-08 NOTE — Assessment & Plan Note (Addendum)
#  Stage I ER PR positive HER-2 negative [HIGH risk mamma print]-most recently on tamoxifen+ BSO.  However tamoxifen stopped November 2024-Serous retinitis.   # Discussed importance of continued endocrine therapy for total of 5 years at least if not longer 10 years given the need for chemotherapy/high risk MammaPrint.   # Patient  currently on Femara.   Tolerating well.   # Hip pain/Left LE pain-G-1-  stable.  # Hot flashes-postmenopausal status/BSO-stable.  # BMD- June 2020- T score-Normal; BMD -March 2024= T-score -0.3.  On adjuvant Zometa every 6 months x 9 infusions.  will discontinue Zometa for now. Will repeat 2026.    # Peripheral neuropathy- G-1- stable  # Intermittent leukopenia/neutropenia-stable.  However mild anemia- Hb 12 ? Etiology; last colonoscopy- 2022- stable.   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  Discussed with ophthalmology.  Patient currently discontinued tamoxifen- resolved.   # DISPOSITION # HOLD Zometa today # Follow up in 6  months- MD; labs- cbc/cmp;  Dr.B

## 2023-05-08 NOTE — Progress Notes (Signed)
[facility] OFFICE PROGRESS NOTE  Patient Care Team: Corky Downs, MD as PCP - General (Internal Medicine) Lemar Livings, Merrily Pew, MD as Surgeon (General Surgery) Jim Like, RN as Oncology Nurse Navigator [name], MD as Referring Physician (Radiation Oncology) Earna Coder, MD as Consulting Physician (Internal Medicine) Rosey Bath, MD (Inactive) as Referring Physician (Hematology and Oncology)   Cancer Staging  No matching staging information was found for the patient.    Oncology History Overview Note  # May 2019- RIGHT BREAST CA s/p Lumpec & SLNBx [pT1c pN0 (sn); G-1;  margins clear; ER/PR > 90%; Her 2 neu- FISH-NEG. MAMMAPRINT [Dr.Byrnett]- HIGH RISK  # July 29th TC x4 [finished 02/15/2018] s/p RT [dec 11th 2019]; # jan 13th 2020- start Tam; STOPPED in SEP 2020- sec to intolerance [hot flashes]  #Nov 2020-Arimidex [s/p BSO]; STOPPED MARCH 2022- sec to MSK symptoms; MID April 2022- Aromasin 25 mg/day [stopped 3 weeks- STOPPED sec to RASH].   # MAY 6th, 2022- START TAMOXIFEN [BSO]; stopped in NOV 2024- [serous retinitis]  # March 20th, 2024- start Letrozole.   # March 9th 2020- Triptorelinq 4q; starting June 2020- switch to q12w; August 2020-BSO; discontinue triptorelin.   # march 9th 2020- Zometa 4 mg/ adjuvant   GENETIC TESTING: [Dr.Byrnett] NEGATIVE FOR - ATM/ BRCA2/CHEK2/PTEN/TP53/BRCA1/CDH1/PALB2/ STK11   DIAGNOSIS: BREAST CA       Malignant neoplasm of upper-outer quadrant of right breast in female, estrogen receptor positive (HCC)  11/03/2017 Initial Diagnosis   Malignant neoplasm of upper-outer quadrant of right breast in female, estrogen receptor positive (HCC)     INTERVAL HISTORY: Patient ambulating-independently.  Alone.    Crystal Branch 52 y.o.  female pleasant patient above history of stage I breast cancer ER PR positive HER-2/neu negative-BSO -currently on AI s here for follow-up.  Patient continues to to  having tenderness and soreness around incision, has been ongoing, no worse, comes and go.   Patient continues to do letrozole without any major side effects.  Mild hot flashes.  No nausea no vomiting.  No significant weight gain.  No significant mood changes.  Review of Systems  Constitutional:  Positive for malaise/fatigue. Negative for chills, diaphoresis, fever and weight loss.  HENT:  Negative for nosebleeds and sore throat.   Eyes:  Negative for double vision.  Respiratory:  Negative for cough, hemoptysis, sputum production, shortness of breath and wheezing.   Cardiovascular:  Negative for chest pain, palpitations, orthopnea and leg swelling.  Gastrointestinal:  Negative for abdominal pain, blood in stool, constipation, heartburn, melena, nausea and vomiting.  Genitourinary:  Negative for dysuria, frequency and urgency.  Musculoskeletal:  Positive for joint pain. Negative for back pain.  Skin:  Negative for itching.  Neurological:  Positive for tingling. Negative for dizziness, focal weakness, weakness and headaches.  Endo/Heme/Allergies:  Does not bruise/bleed easily.  Psychiatric/Behavioral:  Negative for depression. The patient is not nervous/anxious and does not have insomnia.       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  Mammoprint: High risk.    Dysrhythmia    atrial tach/ 2013   Personal history of chemotherapy    Personal history of radiation therapy     PAST SURGICAL HISTORY :   Past Surgical History:  Procedure Laterality Date   BREAST BIOPSY  Right 09/30/2017   US guided biopsy, INVASIVE MAMMARY CARCINOMA WITH MUCINOUS FEATURES ER/PR positive   BREAST LUMPECTOMY Right 10/26/2017   BREAST LUMPECTOMY WITH SENTINEL LYMPH NODE BIOPSY Right 10/26/2017   Procedure: BREAST LUMPECTOMY WITH SENTINEL  LYMPH NODE BX;  Surgeon: Earline Mayotte, MD;  Location: [facility] OR[facility];  Service: General;  Laterality: Right;   COLONOSCOPY WITH PROPOFOL N/A 12/01/2018   Procedure: COLONOSCOPY WITH PROPOFOL;  Surgeon: Earline Mayotte, MD;  Location: [facility];  Service: Endoscopy;  Laterality: N/A;   LAPAROSCOPIC BILATERAL SALPINGO OOPHERECTOMY N/A 01/06/2019   Procedure: LAPAROSCOPIC BILATERAL SALPINGO OOPHORECTOMY;  Surgeon: Conard Novak, MD;  Location: [facility] OR[facility];  Service: Gynecology;  Laterality: N/A;   TONSILLECTOMY  2015    FAMILY HISTORY :   Family History  Problem Relation Age of Onset   Breast cancer Maternal Aunt 60   Breast cancer Maternal Grandmother 39   Colon cancer Mother 94    SOCIAL HISTORY:   Social History   Tobacco Use   Smoking status: Never   Smokeless tobacco: Never  Vaping Use   Vaping status: Never Used  Substance Use Topics   Alcohol use: Never   Drug use: Never    ALLERGIES:  is allergic to prednisone and exemestane.  MEDICATIONS:  Current Outpatient Medications  Medication Sig Dispense Refill   acetaminophen (TYLENOL) 650 MG CR tablet Take 1 tablet by mouth 3 (three) times daily. Prn pain     aspirin 81 MG chewable tablet Chew 81 mg by mouth daily.     Calcium Carb-Cholecalciferol (CALCIUM-VITAMIN D3) 600-500 MG-UNIT CAPS Take 1 capsule by mouth 2 (two) times daily.     letrozole (FEMARA) 2.5 MG tablet TAKE 1 TABLET BY MOUTH DAILY 30 tablet 11   Misc Natural Products (OSTEO BI-FLEX/5-LOXIN ADVANCED PO) Take 1 capsule by mouth 2 (two) times daily.     Multiple Vitamin (MULTIVITAMIN WITH MINERALS) TABS tablet Take 1 tablet by mouth daily.     Probiotic Product (PHILLIPS COLON HEALTH) CAPS Take 1 capsule by mouth every evening.      Vitamin D, Ergocalciferol, (DRISDOL) 1.25 MG (50000 UNIT) CAPS capsule TAKE 1 CAPSULE BY MOUTH 1 TIME A WEEK 12 capsule 1   No current facility-administered medications for this visit.    PHYSICAL EXAMINATION: ECOG  PERFORMANCE STATUS: 0 - Asymptomatic  BP 111/61 (BP Location: Left Arm, Patient Position: Sitting, Cuff Size: Normal)   Pulse 60   Temp (!) 97.5 F (36.4 C)   Ht 5\' 4"  (1.626 m)   Wt 144 lb 3.2 oz (65.4 kg)   SpO2 100%   BMI 24.75 kg/m   Filed Weights   05/08/23 1059  Weight: 144 lb 3.2 oz (65.4 kg)   Physical Exam Constitutional:      Comments: Alone.  Walking by self.  HENT:     Head: Normocephalic and atraumatic.     Mouth/Throat:     Pharynx: No oropharyngeal exudate.  Eyes:     Pupils: Pupils are equal, round, and reactive to light.  Cardiovascular:     Rate and Rhythm: Normal rate and regular rhythm.  Pulmonary:     Effort: No respiratory distress.     Breath sounds: No wheezing.  Abdominal:     General: Bowel sounds are normal. There is no distension.     Palpations: Abdomen is soft. There is no mass.     Tenderness: There is no abdominal tenderness. There is no guarding or rebound.  Musculoskeletal:  General: No tenderness. Normal range of motion.     Cervical back: Normal range of motion and neck supple.  Skin:    General: Skin is warm.  Neurological:     Mental Status: She is alert and oriented to person, place, and time.  Psychiatric:        Mood and Affect: Affect normal.     LABORATORY DATA:  I have reviewed the data as listed    Component Value Date/Time   NA 137 05/08/2023 1035   NA 143 10/13/2017 0920   NA 142 06/19/2011 2227   K 4.3 05/08/2023 1035   K 3.8 06/19/2011 2227   CL 102 05/08/2023 1035   CL 107 06/19/2011 2227   CO2 27 05/08/2023 1035   CO2 21 06/19/2011 2227   GLUCOSE 86 05/08/2023 1035   GLUCOSE 103 (H) 06/19/2011 2227   BUN 14 05/08/2023 1035   BUN 13 10/13/2017 0920   BUN 10 06/19/2011 2227   CREATININE 0.63 05/08/2023 1035   CREATININE 0.75 06/19/2011 2227   CALCIUM 9.3 05/08/2023 1035   CALCIUM 9.5 06/19/2011 2227   PROT 6.9 05/08/2023 1035   PROT 6.5 10/13/2017 0920   ALBUMIN 4.6 05/08/2023 1035   ALBUMIN  4.6 10/13/2017 0920   AST 22 05/08/2023 1035   ALT 20 05/08/2023 1035   ALKPHOS 41 05/08/2023 1035   BILITOT 0.7 05/08/2023 1035   GFRNONAA >60 05/08/2023 1035   GFRNONAA >60 06/19/2011 2227   GFRAA >60 01/24/2020 1247   GFRAA >60 06/19/2011 2227    No results found for: "SPEP", "UPEP"  Lab Results  Component Value Date   WBC 2.9 (L) 05/08/2023   NEUTROABS 1.5 (L) 05/08/2023   HGB 12.2 05/08/2023   HCT 36.9 05/08/2023   MCV 99.5 05/08/2023   PLT 232 05/08/2023      Chemistry      Component Value Date/Time   NA 137 05/08/2023 1035   NA 143 10/13/2017 0920   NA 142 06/19/2011 2227   K 4.3 05/08/2023 1035   K 3.8 06/19/2011 2227   CL 102 05/08/2023 1035   CL 107 06/19/2011 2227   CO2 27 05/08/2023 1035   CO2 21 06/19/2011 2227   BUN 14 05/08/2023 1035   BUN 13 10/13/2017 0920   BUN 10 06/19/2011 2227   CREATININE 0.63 05/08/2023 1035   CREATININE 0.75 06/19/2011 2227      Component Value Date/Time   CALCIUM 9.3 05/08/2023 1035   CALCIUM 9.5 06/19/2011 2227   ALKPHOS 41 05/08/2023 1035   AST 22 05/08/2023 1035   ALT 20 05/08/2023 1035   BILITOT 0.7 05/08/2023 1035       RADIOGRAPHIC STUDIES: I have personally reviewed the radiological images as listed and agreed with the findings in the report. No results found.   ASSESSMENT & PLAN:  Malignant neoplasm of upper-outer quadrant of right breast in female, estrogen receptor positive (HCC) #Stage I ER PR positive HER-2 negative [HIGH risk mamma print]-most recently on tamoxifen+ BSO.  However tamoxifen stopped November 2024-Serous retinitis.   # Discussed importance of continued endocrine therapy for total of 5 years at least if not longer 10 years given the need for chemotherapy/high risk MammaPrint.   # Patient  currently on Femara.   Tolerating well.   # Hip pain/Left LE pain-G-1-  stable.  # Hot flashes-postmenopausal status/BSO-stable.  # BMD- June 2020- T score-Normal; BMD -March 2024= T-score -0.3.   On adjuvant Zometa every 6 months x 9 infusions.  will discontinue Zometa for now. Will repeat 2026.    # Peripheral neuropathy- G-1- stable  # Intermittent leukopenia/neutropenia-stable.  However mild anemia- Hb 12 ? Etiology; last colonoscopy- 2022- stable.   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  Discussed with ophthalmology.  Patient currently discontinued tamoxifen- resolved.   # DISPOSITION # HOLD Zometa today # Follow up in 6  months- MD; labs- cbc/cmp;  Dr.B    Orders Placed This Encounter  Procedures   CBC with Differential (Cancer Center Only)    Standing Status:   Future    Expected Date:   11/06/2023    Expiration Date:   05/07/2024   CMP (Cancer Center only)    Standing Status:   Future    Expected Date:   11/06/2023    Expiration Date:   05/07/2024    All questions were answered. The patient knows to call the clinic with any problems, questions or concerns.      Earna Coder, MD 05/08/2023 11:22 AM

## 2023-06-12 ENCOUNTER — Encounter: Payer: Self-pay | Admitting: Internal Medicine

## 2023-10-13 ENCOUNTER — Other Ambulatory Visit: Payer: Self-pay | Admitting: Surgery

## 2023-10-13 DIAGNOSIS — Z1231 Encounter for screening mammogram for malignant neoplasm of breast: Secondary | ICD-10-CM

## 2023-11-06 ENCOUNTER — Encounter: Payer: Self-pay | Admitting: Nurse Practitioner

## 2023-11-06 ENCOUNTER — Encounter: Payer: Self-pay | Admitting: Internal Medicine

## 2023-11-06 ENCOUNTER — Inpatient Hospital Stay (HOSPITAL_BASED_OUTPATIENT_CLINIC_OR_DEPARTMENT_OTHER): Payer: Self-pay | Admitting: Nurse Practitioner

## 2023-11-06 ENCOUNTER — Inpatient Hospital Stay: Payer: Self-pay | Attending: Internal Medicine

## 2023-11-06 ENCOUNTER — Ambulatory Visit: Payer: Managed Care, Other (non HMO) | Admitting: Internal Medicine

## 2023-11-06 ENCOUNTER — Other Ambulatory Visit: Payer: Managed Care, Other (non HMO)

## 2023-11-06 VITALS — BP 111/50 | HR 62 | Temp 97.3°F | Resp 12 | Ht 64.0 in | Wt 144.4 lb

## 2023-11-06 DIAGNOSIS — Z79811 Long term (current) use of aromatase inhibitors: Secondary | ICD-10-CM | POA: Diagnosis not present

## 2023-11-06 DIAGNOSIS — Z9221 Personal history of antineoplastic chemotherapy: Secondary | ICD-10-CM | POA: Diagnosis not present

## 2023-11-06 DIAGNOSIS — Z923 Personal history of irradiation: Secondary | ICD-10-CM | POA: Insufficient documentation

## 2023-11-06 DIAGNOSIS — Z1721 Progesterone receptor positive status: Secondary | ICD-10-CM | POA: Insufficient documentation

## 2023-11-06 DIAGNOSIS — Z170 Estrogen receptor positive status [ER+]: Secondary | ICD-10-CM | POA: Insufficient documentation

## 2023-11-06 DIAGNOSIS — Z1732 Human epidermal growth factor receptor 2 negative status: Secondary | ICD-10-CM | POA: Diagnosis not present

## 2023-11-06 DIAGNOSIS — Z5181 Encounter for therapeutic drug level monitoring: Secondary | ICD-10-CM

## 2023-11-06 DIAGNOSIS — C50411 Malignant neoplasm of upper-outer quadrant of right female breast: Secondary | ICD-10-CM | POA: Diagnosis present

## 2023-11-06 DIAGNOSIS — Z08 Encounter for follow-up examination after completed treatment for malignant neoplasm: Secondary | ICD-10-CM

## 2023-11-06 LAB — CMP (CANCER CENTER ONLY)
ALT: 22 U/L (ref 0–44)
AST: 24 U/L (ref 15–41)
Albumin: 4.4 g/dL (ref 3.5–5.0)
Alkaline Phosphatase: 65 U/L (ref 38–126)
Anion gap: 7 (ref 5–15)
BUN: 12 mg/dL (ref 6–20)
CO2: 27 mmol/L (ref 22–32)
Calcium: 9 mg/dL (ref 8.9–10.3)
Chloride: 103 mmol/L (ref 98–111)
Creatinine: 0.74 mg/dL (ref 0.44–1.00)
GFR, Estimated: >60 mL/min (ref >60)
Glucose, Bld: 108 mg/dL — ABNORMAL HIGH (ref 70–99)
Potassium: 3.9 mmol/L (ref 3.5–5.1)
Sodium: 137 mmol/L (ref 135–145)
Total Bilirubin: 0.8 mg/dL (ref 0.0–1.2)
Total Protein: 6.7 g/dL (ref 6.5–8.1)

## 2023-11-06 LAB — CBC WITH DIFFERENTIAL (CANCER CENTER ONLY)
Abs Immature Granulocytes: 0.02 10*3/uL (ref 0.00–0.07)
Basophils Absolute: 0 10*3/uL (ref 0.0–0.1)
Basophils Relative: 1 %
Eosinophils Absolute: 0 10*3/uL (ref 0.0–0.5)
Eosinophils Relative: 1 %
HCT: 37.6 % (ref 36.0–46.0)
Hemoglobin: 12.4 g/dL (ref 12.0–15.0)
Immature Granulocytes: 0 %
Lymphocytes Relative: 23 %
Lymphs Abs: 1.2 10*3/uL (ref 0.7–4.0)
MCH: 32.1 pg (ref 26.0–34.0)
MCHC: 33 g/dL (ref 30.0–36.0)
MCV: 97.4 fL (ref 80.0–100.0)
Monocytes Absolute: 0.4 10*3/uL (ref 0.1–1.0)
Monocytes Relative: 8 %
Neutro Abs: 3.7 10*3/uL (ref 1.7–7.7)
Neutrophils Relative %: 67 %
Platelet Count: 229 10*3/uL (ref 150–400)
RBC: 3.86 MIL/uL — ABNORMAL LOW (ref 3.87–5.11)
RDW: 11.7 % (ref 11.5–15.5)
WBC Count: 5.4 10*3/uL (ref 4.0–10.5)
nRBC: 0 % (ref 0.0–0.2)

## 2023-11-06 NOTE — Progress Notes (Signed)
 C/o changes in her throat, dry/pain and catches in her sides.

## 2023-11-06 NOTE — Progress Notes (Signed)
 [facility] OFFICE PROGRESS NOTE  Patient Care Team: Theron Flavin, MD as PCP - General (Internal Medicine) Marquita Situ, Magali Schmitz, MD as Surgeon (General Surgery) Burnie Cartwright, RN as Oncology Nurse Navigator Glenis Langdon, MD as Referring Physician (Radiation Oncology) Gwyn Leos, MD as Consulting Physician (Internal Medicine) Satira Curet, MD (Inactive) as Referring Physician (Hematology and Oncology)   Cancer Staging  No matching staging information was found for the patient.  Oncology History Overview Note  # May 2019- RIGHT BREAST CA s/p Lumpec & SLNBx [pT1c pN0 (sn); G-1;  margins clear; ER/PR > 90%; Her 2 neu- FISH-NEG. MAMMAPRINT [Dr.Byrnett]- HIGH RISK  # July 29th TC x4 [finished 02/15/2018] s/p RT [dec 11th 2019]; # jan 13th 2020- start Tam; STOPPED in SEP 2020- sec to intolerance [hot flashes]  #Nov 2020-Arimidex  [s/p BSO]; STOPPED MARCH 2022- sec to MSK symptoms; MID April 2022- Aromasin  25 mg/day [stopped 3 weeks- STOPPED sec to RASH].   # MAY 6th, 2022- START TAMOXIFEN  [BSO]; stopped in NOV 2024- [serous retinitis]  # March 20th, 2024- start Letrozole .   # March 9th 2020- Triptorelinq 4q; starting June 2020- switch to q12w; August 2020-BSO; discontinue triptorelin .   # march 9th 2020- Zometa  4 mg/ adjuvant   GENETIC TESTING: [Dr.Byrnett] NEGATIVE FOR - ATM/ BRCA2/CHEK2/PTEN/TP53/BRCA1/CDH1/PALB2/ STK11   DIAGNOSIS: BREAST CA       Malignant neoplasm of upper-outer quadrant of right breast in female, estrogen receptor positive (HCC)  11/03/2017 Initial Diagnosis   Malignant neoplasm of upper-outer quadrant of right breast in female, estrogen receptor positive (HCC)    INTERVAL HISTORY: Patient ambulating-independently.  Alone.  Crystal Branch 53 y.o.  female pleasant patient above history of stage I breast cancer ER PR positive HER-2/neu negative-BSO -currently on AI who returns to clinic for follow up. She has some catching like  sensation of the right breast at site of surgery. Noticeable but not worsening. Tolerating letrozole  without significant side effects. Complains of voice changes and throat clearing. No painful swallowing or new masses. Weight is stable. Denies any neurologic complaints. Denies recent fevers or illnesses. Denies any easy bleeding or bruising. No melena or hematochezia. No pica or restless leg. Reports good appetite and denies weight loss. Denies chest pain. Denies any nausea, vomiting, constipation, or diarrhea. Denies urinary complaints. Patient offers no further specific complaints today.  Review of Systems  Constitutional:  Positive for malaise/fatigue. Negative for chills, diaphoresis, fever and weight loss.  HENT:  Negative for nosebleeds and sore throat.   Eyes:  Negative for double vision.  Respiratory:  Negative for cough, hemoptysis, sputum production, shortness of breath and wheezing.   Cardiovascular:  Negative for chest pain, palpitations, orthopnea and leg swelling.  Gastrointestinal:  Negative for abdominal pain, blood in stool, constipation, heartburn, melena, nausea and vomiting.  Genitourinary:  Negative for dysuria, frequency and urgency.  Musculoskeletal:  Positive for joint pain. Negative for back pain.  Skin:  Negative for itching.  Neurological:  Positive for tingling. Negative for dizziness, focal weakness, weakness and headaches.  Endo/Heme/Allergies:  Does not bruise/bleed easily.  Psychiatric/Behavioral:  Negative for depression. The patient is not nervous/anxious and does not have insomnia.    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  Mammoprint: High risk.    Dysrhythmia    atrial tach/  2013   Personal history of chemotherapy    Personal history of radiation therapy     PAST SURGICAL HISTORY :    Past Surgical History:  Procedure Laterality Date   BREAST BIOPSY Right 09/30/2017   US  guided biopsy, INVASIVE MAMMARY CARCINOMA WITH MUCINOUS FEATURES ER/PR positive   BREAST LUMPECTOMY Right 10/26/2017   BREAST LUMPECTOMY WITH SENTINEL LYMPH NODE BIOPSY Right 10/26/2017   Procedure: BREAST LUMPECTOMY WITH SENTINEL LYMPH NODE BX;  Surgeon: Marshall Skeeter, MD;  Location: [facility] ORS;  Service: General;  Laterality: Right;   COLONOSCOPY WITH PROPOFOL  N/A 12/01/2018   Procedure: COLONOSCOPY WITH PROPOFOL ;  Surgeon: Marshall Skeeter, MD;  Location: [facility] ENDOSCOPY;  Service: Endoscopy;  Laterality: N/A;   LAPAROSCOPIC BILATERAL SALPINGO OOPHERECTOMY N/A 01/06/2019   Procedure: LAPAROSCOPIC BILATERAL SALPINGO OOPHORECTOMY;  Surgeon: Kris Pester, MD;  Location: [facility] ORS;  Service: Gynecology;  Laterality: N/A;   TONSILLECTOMY  2015    FAMILY HISTORY :   Family History  Problem Relation Age of Onset   Breast cancer Maternal Aunt 60   Breast cancer Maternal Grandmother 7   Colon cancer Mother 83    SOCIAL HISTORY:   Social History   Tobacco Use   Smoking status: Never   Smokeless tobacco: Never  Vaping Use   Vaping status: Never Used  Substance Use Topics   Alcohol use: Never   Drug use: Never    ALLERGIES:  is allergic to prednisone and exemestane .  MEDICATIONS:  Current Outpatient Medications  Medication Sig Dispense Refill   acetaminophen  (TYLENOL ) 650 MG CR tablet Take 1 tablet by mouth 3 (three) times daily. Prn pain     Calcium Carb-Cholecalciferol (CALCIUM-VITAMIN D3) 600-500 MG-UNIT CAPS Take 1 capsule by mouth 2 (two) times daily.     letrozole  (FEMARA ) 2.5 MG tablet TAKE 1 TABLET BY MOUTH DAILY 30 tablet 11   Misc Natural Products (OSTEO BI-FLEX/5-LOXIN ADVANCED PO) Take 1 capsule by mouth 2 (two) times daily.     Multiple Vitamin (MULTIVITAMIN WITH MINERALS) TABS tablet Take 1 tablet by mouth daily.     Probiotic Product (PHILLIPS COLON HEALTH) CAPS Take 1  capsule by mouth every evening.      aspirin 81 MG chewable tablet Chew 81 mg by mouth daily. (Patient not taking: Reported on 11/06/2023)     Vitamin D , Ergocalciferol , (DRISDOL ) 1.25 MG (50000 UNIT) CAPS capsule TAKE 1 CAPSULE BY MOUTH 1 TIME A WEEK (Patient not taking: Reported on 11/06/2023) 12 capsule 1   No current facility-administered medications for this visit.    PHYSICAL EXAMINATION: ECOG PERFORMANCE STATUS: 0 - Asymptomatic  BP (!) 111/50 (BP Location: Left Arm, Patient Position: Sitting, Cuff Size: Normal)   Pulse 62   Temp (!) 97.3 F (36.3 C) (Tympanic)   Resp 12   Ht 5' 4 (1.626 m)   Wt 144 lb 6.4 oz (65.5 kg)   SpO2 100%   BMI 24.79 kg/m   Filed Weights   11/06/23 1335  Weight: 144 lb 6.4 oz (65.5 kg)   Physical Exam Constitutional:      Appearance: She is not ill-appearing.     Comments: Alone.  Walking by self.  HENT:     Head: Normocephalic and atraumatic.     Mouth/Throat:     Pharynx: No oropharyngeal exudate.   Eyes:     General: No scleral icterus.   Cardiovascular:     Rate and Rhythm: Normal rate and regular rhythm.  Pulmonary:  Effort: No respiratory distress.     Breath sounds: No wheezing.  Chest:     Comments: Breast exam was performed in seated position. Patient is status post right lumpectomy with a well-healed surgical scar. No evidence of any palpable masses. No evidence of axillary adenopathy. No evidence of any palpable masses or lumps in the left breast. No evidence of left axillary adenopathy  Abdominal:     General: Bowel sounds are normal. There is no distension.     Palpations: Abdomen is soft.     Tenderness: There is no abdominal tenderness. There is no guarding.   Musculoskeletal:        General: No tenderness. Normal range of motion.  Lymphadenopathy:     Cervical: No cervical adenopathy.   Skin:    General: Skin is warm.   Neurological:     Mental Status: She is alert and oriented to person, place, and time.    Psychiatric:        Mood and Affect: Mood and affect normal.        Behavior: Behavior normal.     LABORATORY DATA:  I have reviewed the data as listed    Component Value Date/Time   NA 137 11/06/2023 1335   NA 143 10/13/2017 0920   NA 142 06/19/2011 2227   K 3.9 11/06/2023 1335   K 3.8 06/19/2011 2227   CL 103 11/06/2023 1335   CL 107 06/19/2011 2227   CO2 27 11/06/2023 1335   CO2 21 06/19/2011 2227   GLUCOSE 108 (H) 11/06/2023 1335   GLUCOSE 103 (H) 06/19/2011 2227   BUN 12 11/06/2023 1335   BUN 13 10/13/2017 0920   BUN 10 06/19/2011 2227   CREATININE 0.74 11/06/2023 1335   CREATININE 0.75 06/19/2011 2227   CALCIUM 9.0 11/06/2023 1335   CALCIUM 9.5 06/19/2011 2227   PROT 6.7 11/06/2023 1335   PROT 6.5 10/13/2017 0920   ALBUMIN 4.4 11/06/2023 1335   ALBUMIN 4.6 10/13/2017 0920   AST 24 11/06/2023 1335   ALT 22 11/06/2023 1335   ALKPHOS 65 11/06/2023 1335   BILITOT 0.8 11/06/2023 1335   GFRNONAA >60 11/06/2023 1335   GFRNONAA >60 06/19/2011 2227   GFRAA >60 01/24/2020 1247   GFRAA >60 06/19/2011 2227    No results found for: SPEP, UPEP  Lab Results  Component Value Date   WBC 5.4 11/06/2023   NEUTROABS 3.7 11/06/2023   HGB 12.4 11/06/2023   HCT 37.6 11/06/2023   MCV 97.4 11/06/2023   PLT 229 11/06/2023      Chemistry      Component Value Date/Time   NA 137 11/06/2023 1335   NA 143 10/13/2017 0920   NA 142 06/19/2011 2227   K 3.9 11/06/2023 1335   K 3.8 06/19/2011 2227   CL 103 11/06/2023 1335   CL 107 06/19/2011 2227   CO2 27 11/06/2023 1335   CO2 21 06/19/2011 2227   BUN 12 11/06/2023 1335   BUN 13 10/13/2017 0920   BUN 10 06/19/2011 2227   CREATININE 0.74 11/06/2023 1335   CREATININE 0.75 06/19/2011 2227      Component Value Date/Time   CALCIUM 9.0 11/06/2023 1335   CALCIUM 9.5 06/19/2011 2227   ALKPHOS 65 11/06/2023 1335   AST 24 11/06/2023 1335   ALT 22 11/06/2023 1335   BILITOT 0.8 11/06/2023 1335       RADIOGRAPHIC  STUDIES: I have personally reviewed the radiological images as listed and agreed with the findings  in the report. No results found.   ASSESSMENT & PLAN:   #Stage I ER PR positive HER-2 negative [HIGH risk mamma print]-most recently on tamoxifen + BSO.  However tamoxifen  stopped November 2024-Serous retinitis. High risk MammaPrint. Plan for 5-10 years of endocrine therapy. Tolerating femara  well. Continue. Mammogram in July as scheduled. Clinically, NED today. Mild pain in right breastwhich I supect is post operative. Discussed movement and exercise. Declined PT but could consider in future if bothersome. Continue 6 mo surveillance.    # Hip pain/Left LE pain-G-1-  stable.   # Hot flashes-postmenopausal status/BSO-stable.   # BMD- June 2020- T score-Normal; BMD -March 2024= T-score -0.3.  On adjuvant Zometa  every 6 months x 9 infusions.  will discontinue Zometa  for now. Will repeat BMD 2026.     # Peripheral neuropathy- G-1- stable  # voice change- question GERD? No palpable adenopathy or abnormality on exam. Trial PPI x 1 month. If no effect, wean off and consider ref to ENT for evaluation. If improvement, can continue otc ppi.    # Intermittent leukopenia/neutropenia-stable.  However mild anemia- Hb 12 ? Etiology; last colonoscopy- 2022- stable.    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  Discussed with ophthalmology.  Patient currently discontinued tamoxifen - resolved.    # DISPOSITION 6.5-7 mo (due to holidays)- lab, Dr Valentine Gasmen- [place]   No problem-specific Assessment & Plan notes found for this encounter.  No orders of the defined types were placed in this encounter.  All questions were answered. The patient knows to call the clinic with any problems, questions or concerns.      Nelda Balsam, NP 11/06/2023

## 2023-11-26 ENCOUNTER — Other Ambulatory Visit: Payer: Self-pay | Admitting: Medical Genetics

## 2023-12-02 ENCOUNTER — Other Ambulatory Visit: Payer: Self-pay

## 2023-12-02 ENCOUNTER — Ambulatory Visit
Admission: RE | Admit: 2023-12-02 | Discharge: 2023-12-02 | Disposition: A | Discharge status: Home / Self Care | Payer: Self-pay | Source: Ambulatory Visit | Attending: Surgery | Admitting: Surgery

## 2023-12-02 DIAGNOSIS — Z1231 Encounter for screening mammogram for malignant neoplasm of breast: Secondary | ICD-10-CM | POA: Insufficient documentation

## 2023-12-04 ENCOUNTER — Other Ambulatory Visit
Admission: RE | Admit: 2023-12-04 | Discharge: 2023-12-04 | Disposition: A | Discharge status: Home / Self Care | Payer: Self-pay | Source: Ambulatory Visit | Attending: Medical Genetics | Admitting: Medical Genetics

## 2023-12-15 LAB — GENECONNECT MOLECULAR SCREEN: Genetic Analysis Overall Interpretation: NEGATIVE

## 2024-01-13 ENCOUNTER — Ambulatory Visit
Admission: RE | Admit: 2024-01-13 | Discharge: 2024-01-13 | Disposition: A | Discharge status: Home / Self Care | Attending: Surgery | Admitting: Surgery

## 2024-01-13 ENCOUNTER — Encounter
Admission: RE | Disposition: A | Discharge status: Home / Self Care | Payer: Self-pay | Source: Home / Self Care | Attending: Surgery

## 2024-01-13 ENCOUNTER — Ambulatory Visit: Admitting: Registered Nurse

## 2024-01-13 ENCOUNTER — Encounter: Payer: Self-pay | Admitting: Surgery

## 2024-01-13 ENCOUNTER — Other Ambulatory Visit: Payer: Self-pay | Admitting: Nurse Practitioner

## 2024-01-13 DIAGNOSIS — Z9221 Personal history of antineoplastic chemotherapy: Secondary | ICD-10-CM | POA: Insufficient documentation

## 2024-01-13 DIAGNOSIS — Z923 Personal history of irradiation: Secondary | ICD-10-CM | POA: Diagnosis not present

## 2024-01-13 DIAGNOSIS — Z853 Personal history of malignant neoplasm of breast: Secondary | ICD-10-CM | POA: Diagnosis not present

## 2024-01-13 DIAGNOSIS — K5730 Diverticulosis of large intestine without perforation or abscess without bleeding: Secondary | ICD-10-CM | POA: Diagnosis not present

## 2024-01-13 DIAGNOSIS — Z1231 Encounter for screening mammogram for malignant neoplasm of breast: Secondary | ICD-10-CM | POA: Diagnosis not present

## 2024-01-13 DIAGNOSIS — Z800 Family history of malignant neoplasm of digestive organs: Secondary | ICD-10-CM | POA: Diagnosis not present

## 2024-01-13 DIAGNOSIS — Z1211 Encounter for screening for malignant neoplasm of colon: Secondary | ICD-10-CM | POA: Diagnosis present

## 2024-01-13 DIAGNOSIS — K640 First degree hemorrhoids: Secondary | ICD-10-CM | POA: Diagnosis not present

## 2024-01-13 HISTORY — PX: COLONOSCOPY: SHX5424

## 2024-01-13 SURGERY — COLONOSCOPY
Anesthesia: General | Site: Abdomen

## 2024-01-13 MED ORDER — PROPOFOL 1000 MG/100ML IV EMUL
INTRAVENOUS | Status: AC
  Filled 2024-01-13: qty 100

## 2024-01-13 MED ORDER — PROPOFOL 500 MG/50ML IV EMUL
INTRAVENOUS | Status: DC | PRN
  Administered 2024-01-13: 150 ug/kg/min via INTRAVENOUS

## 2024-01-13 MED ORDER — PROPOFOL 10 MG/ML IV BOLUS
INTRAVENOUS | Status: DC | PRN
  Administered 2024-01-13: 100 mg via INTRAVENOUS

## 2024-01-13 MED ORDER — SODIUM CHLORIDE 0.9 % IV SOLN: INTRAVENOUS | Status: DC

## 2024-01-13 MED ORDER — DEXMEDETOMIDINE HCL IN NACL 80 MCG/20ML IV SOLN
INTRAVENOUS | Status: AC
Start: 2024-01-13 — End: 2024-01-13
  Filled 2024-01-13: qty 20

## 2024-01-13 MED ORDER — DEXMEDETOMIDINE HCL IN NACL 80 MCG/20ML IV SOLN
INTRAVENOUS | Status: DC | PRN
  Administered 2024-01-13 (×2): 4 ug via INTRAVENOUS

## 2024-01-13 MED ORDER — LIDOCAINE HCL (PF) 2 % IJ SOLN
INTRAMUSCULAR | Status: AC
  Filled 2024-01-13: qty 5

## 2024-01-13 MED ORDER — LIDOCAINE HCL (CARDIAC) PF 100 MG/5ML IV SOSY
PREFILLED_SYRINGE | INTRAVENOUS | Status: DC | PRN
  Administered 2024-01-13: 40 mg via INTRAVENOUS

## 2024-01-13 NOTE — H&P (Signed)
 Subjective:   CC: Personal history of breast cancer [Z85.3] HPI: Crystal Branch is a 53 y.o. female who returns for evaluation of above. No issues reported.  This an established patient is here today for: office visit. She is here to follow up from a recent mammogram done at [facility].  No new breast issues, history right lumpectomy in 2019. She is on Tamoxifen , not tolerated. Now on letrozole   Past Medical History: has a past medical history of BRCA negative, Breast cancer (CMS/HHS-HCC) (10/26/2017), and Dysrhythmia.  Past Surgical History: has a past surgical history that includes Breast excisional biopsy (Right); breast lumpectomy (Right, 2019); Colonoscopy (12/01/2018); laparoscopic bilateral salpingo oopherectomy; and Tonsillectomy.  Family History: family history includes Breast cancer in her maternal aunt, maternal grandmother, and paternal grandmother; Colon cancer in her mother.  Social History: reports that she has never smoked. She has never been exposed to tobacco smoke. She has never used smokeless tobacco. She reports that she does not drink alcohol and does not use drugs.  Current Medications: has a current medication list which includes the following prescription(s): acetaminophen , calcium carbonate-vitamin d3, glucosamine/chondr su a sod, lactobacillus acidophilus, letrozole , multivitamin, and zoledronic  acid.  Allergies:  Allergies as of 12/09/2023 - Reviewed 12/09/2023  Allergen Reaction Noted  Prednisone Other (See Comments) 09/26/2021  Aromasin  [exemestane ] Rash 11/29/2020   ROS:  A 15 point review of systems was performed and was negative except as noted in HPI  Objective:    BP 107/63  Pulse 57  Ht 162.6 cm (5' 4)  Wt 68 kg (150 lb)  LMP 12/17/2017 Comment: age first period 69 P0, G0  BMI 25.75 kg/m   Constitutional : No distress, cooperative, alert  Lymphatics/Throat: Supple with no lymphadenopathy  Respiratory: Clear to auscultation bilaterally   Cardiovascular: Regular rate and rhythm  Gastrointestinal: Soft, non-tender, non-distended, no organomegaly.  Musculoskeletal: Steady gait and movement  Skin: Cool and moist, right breast surgical scars  Psychiatric: Normal affect, non-agitated, not confused  Breast: Normal appearance and no palpable abnormality in bilateral breasts and axilla. Chaperone present for exam.    LABS:  N/a   RADS: CLINICAL DATA: Screening.   EXAM:  DIGITAL SCREENING BILATERAL MAMMOGRAM WITH TOMOSYNTHESIS AND CAD   TECHNIQUE:  Bilateral screening digital craniocaudal and mediolateral oblique  mammograms were obtained. Bilateral screening digital breast  tomosynthesis was performed. The images were evaluated with  computer-aided detection.   COMPARISON: Previous exam(s).   ACR Breast Density Category c: The breasts are heterogeneously  dense, which may obscure small masses.   FINDINGS:  There are no findings suspicious for malignancy.   IMPRESSION:  No mammographic evidence of malignancy. A result letter of this  screening mammogram will be mailed directly to the patient.   RECOMMENDATION:  Screening mammogram in one year. (Code:SM-B-01Y)   BI-RADS CATEGORY 1: Negative.   Electronically Signed  By: Alm Parkins M.D.  On: 12/07/2023 11:45   Assessment:   Personal history of breast cancer [Z85.3] Letrozole  per onc  Colonoscopy due this year  Plan:    1. Personal history of breast cancer [Z85.3] Stable. F/u one year for annual exam. Also due for colonoscopy secondary to family history. Will schedule.  R/b/a discussed. Risks include bleeding, perforation. Benefits include diagnostic, curative procedure if needed. Alternatives include continued observation. Pt verbalized understanding.  labs/images/medications/previous chart entries reviewed personally and relevant changes/updates noted above.

## 2024-01-13 NOTE — Interval H&P Note (Signed)
 History and Physical Interval Note:  01/13/2024 7:09 AM  Crystal Branch  has presented today for surgery, with the diagnosis of Z80.0 Family Hx colon cancer.  The various methods of treatment have been discussed with the patient and family. After consideration of risks, benefits and other options for treatment, the patient has consented to  Procedure(s): COLONOSCOPY (N/A) as a surgical intervention.  The patient's history has been reviewed, patient examined, no change in status, stable for surgery.  I have reviewed the patient's chart and labs.  Questions were answered to the patient's satisfaction.     [name]

## 2024-01-13 NOTE — Anesthesia Procedure Notes (Signed)
 Date/Time: 01/13/2024 7:32 AM  Performed by: Tod Handing, CRNAPre-anesthesia Checklist: Patient identified, Emergency Drugs available, Suction available and Patient being monitored Patient Re-evaluated:Patient Re-evaluated prior to induction Oxygen Delivery Method: Nasal cannula Induction Type: IV induction Dental Injury: Teeth and Oropharynx as per pre-operative assessment  Comments: Nasal cannula with etCO2 monitoring

## 2024-01-13 NOTE — Op Note (Signed)
 [facility] Gastroenterology Patient Name: Crystal Branch Procedure Date: 01/13/2024 7:07 AM MRN: [id number] Account #: [id number] Date of Birth: [birth year] Admit Type: Outpatient Age: 53 Room: [facility] ENDO ROOM 1 Gender: Female Note Status: Finalized Instrument Name: Colon Scope [id number] Procedure:             Colonoscopy Indications:           Screening in patient at increased risk: Family history                         of 1st-degree relative with colorectal cancer Providers:             Henriette Pierre MD, MD Referring MD:          no PCP Medicines:             Propofol  per Anesthesia Complications:         No immediate complications. Procedure:             Pre-Anesthesia Assessment:                        - After reviewing the risks and benefits, the patient                         was deemed in satisfactory condition to undergo the                         procedure in an ambulatory setting.                        After obtaining informed consent, the colonoscope was                         passed under direct vision. Throughout the procedure,                         the patient's blood pressure, pulse, and oxygen                         saturations were monitored continuously. The                         Colonoscope was introduced through the anus and                         advanced to the the cecum, identified by the ileocecal                         valve. The colonoscopy was performed without                         difficulty. The patient tolerated the procedure well.                         The quality of the bowel preparation was adequate. Findings:      The perianal and digital rectal examinations were normal.      A few small-mouthed diverticula were found in the sigmoid colon.      Non-bleeding internal hemorrhoids were found during retroflexion. The       hemorrhoids were  Grade I (internal hemorrhoids that do not prolapse). Impression:            -  Diverticulosis in the sigmoid colon.                        - Non-bleeding internal hemorrhoids.                        - No specimens collected. Recommendation:        - Written discharge instructions were provided to the                         patient.                        - Discharge patient to home.                        - Resume previous diet.                        - Repeat colonoscopy in 5 years for screening purposes. Procedure Code(s):     --- Professional ---                        H9894, Colorectal cancer screening; colonoscopy on                         individual at high risk Diagnosis Code(s):     --- Professional ---                        Z80.0, Family history of malignant neoplasm of                         digestive organs                        K64.0, First degree hemorrhoids                        K57.30, Diverticulosis of large intestine without                         perforation or abscess without bleeding CPT copyright 2022 American Medical Association. All rights reserved. The codes documented in this report are preliminary and upon coder review may  be revised to meet current compliance requirements. Dr. Henriette Sevin, MD Henriette Pierre MD, MD 01/13/2024 8:00:22 AM This report has been signed electronically. Number of Addenda: 0 Note Initiated On: 01/13/2024 7:07 AM Scope Withdrawal Time: 0 hours 7 minutes 34 seconds  Total Procedure Duration: 0 hours 18 minutes 19 seconds  Estimated Blood Loss:  Estimated blood loss: none.      [facility]

## 2024-01-13 NOTE — Transfer of Care (Signed)
 Immediate Anesthesia Transfer of Care Note  Patient: Crystal Branch  Procedure(s) Performed: Procedure(s): COLONOSCOPY (N/A)  Patient Location: PACU and Endoscopy Unit  Anesthesia Type:General  Level of Consciousness: sedated  Airway & Oxygen Therapy: Patient Spontanous Breathing and Patient connected to nasal cannula oxygen  Post-op Assessment: Report given to RN and Post -op Vital signs reviewed and stable  Post vital signs: Reviewed and stable  Last Vitals:  Vitals:   01/13/24 0703 01/13/24 0801  BP: 129/66 (!) 93/48  Pulse: 71   Resp: 16 16  Temp: (!) 36.1 C   SpO2: 100% 100%    Complications: No apparent anesthesia complications

## 2024-01-13 NOTE — Anesthesia Preprocedure Evaluation (Signed)
 Anesthesia Evaluation  Patient identified by MRN, date of birth, ID band Patient awake    Reviewed: Allergy & Precautions, H&P , NPO status , Patient's Chart, lab work & pertinent test results, reviewed documented beta blocker date and time   Airway Mallampati: II   Neck ROM: full    Dental  (+) Poor Dentition   Pulmonary neg pulmonary ROS   Pulmonary exam normal        Cardiovascular negative cardio ROS Normal cardiovascular exam Rhythm:regular Rate:Normal     Neuro/Psych negative neurological ROS  negative psych ROS   GI/Hepatic negative GI ROS, Neg liver ROS,,,  Endo/Other  negative endocrine ROS    Renal/GU negative Renal ROS  negative genitourinary   Musculoskeletal   Abdominal   Peds  Hematology negative hematology ROS (+)   Anesthesia Other Findings Past Medical History: 09/2016: BRCA negative     Comment:  [facility] 09/30/2017: Breast CA (HCC)     Comment:  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  Mammoprint: High risk.  No date: Dysrhythmia     Comment:  atrial tach/ 2013 No date: Personal history of chemotherapy No date: Personal history of radiation therapy Past Surgical History: 09/30/2017: BREAST BIOPSY; Right     Comment:  US  guided biopsy, INVASIVE MAMMARY CARCINOMA WITH               MUCINOUS FEATURES ER/PR positive 10/26/2017: BREAST LUMPECTOMY; Right 10/26/2017: BREAST LUMPECTOMY WITH SENTINEL LYMPH NODE BIOPSY; Right     Comment:  Procedure: BREAST LUMPECTOMY WITH SENTINEL LYMPH NODE               BX;  Surgeon: Dessa Reyes ORN, MD;  Location: [facility]               ORS;  Service: General;  Laterality: Right; 12/01/2018: COLONOSCOPY WITH PROPOFOL ; N/A     Comment:  Procedure: COLONOSCOPY WITH PROPOFOL ;  Surgeon: Dessa Reyes ORN, MD;  Location: [facility] ENDOSCOPY;  Service:               Endoscopy;  Laterality:  N/A; 01/06/2019: LAPAROSCOPIC BILATERAL SALPINGO OOPHERECTOMY; N/A     Comment:  Procedure: LAPAROSCOPIC BILATERAL SALPINGO OOPHORECTOMY;              Surgeon: Leonce Garnette BIRCH, MD;  Location: [facility] ORS;                Service: Gynecology;  Laterality: N/A; 2015: TONSILLECTOMY BMI    Body Mass Index: 24.65 kg/m     Reproductive/Obstetrics negative OB ROS                              Anesthesia Physical Anesthesia Plan  ASA: 2  Anesthesia Plan: General   Post-op Pain Management:    Induction:   PONV Risk Score and Plan:   Airway Management Planned:   Additional Equipment:   Intra-op Plan:   Post-operative Plan:   Informed Consent: I have reviewed the patients History and Physical, chart, labs and discussed the procedure including the risks, benefits and alternatives for the proposed anesthesia with the patient or authorized representative who has indicated his/her understanding and acceptance.     Dental Advisory Given  Plan Discussed with: CRNA  Anesthesia Plan Comments:  Anesthesia Quick Evaluation

## 2024-01-14 ENCOUNTER — Encounter: Payer: Self-pay | Admitting: Internal Medicine

## 2024-01-19 NOTE — Anesthesia Postprocedure Evaluation (Signed)
 Anesthesia Post Note  Patient: Mariadelaluz B Schomer  Procedure(s) Performed: COLONOSCOPY (Abdomen)  Patient location during evaluation: PACU Anesthesia Type: General Level of consciousness: awake and alert Pain management: pain level controlled Vital Signs Assessment: post-procedure vital signs reviewed and stable Respiratory status: spontaneous breathing, nonlabored ventilation, respiratory function stable and patient connected to nasal cannula oxygen Cardiovascular status: blood pressure returned to baseline and stable Postop Assessment: no apparent nausea or vomiting Anesthetic complications: no   No notable events documented.   Last Vitals:  Vitals:   01/13/24 0810 01/13/24 0820  BP: 101/63 109/67  Pulse:    Resp: 16 16  Temp:    SpO2: 100% 100%    Last Pain:  Vitals:   01/14/24 0747  TempSrc:   PainSc: 0-No pain                 Lynwood KANDICE Clause

## 2024-05-24 DIAGNOSIS — C50411 Malignant neoplasm of upper-outer quadrant of right female breast: Secondary | ICD-10-CM

## 2024-05-25 ENCOUNTER — Inpatient Hospital Stay: Attending: Internal Medicine

## 2024-05-25 ENCOUNTER — Inpatient Hospital Stay: Admitting: Internal Medicine

## 2024-05-25 ENCOUNTER — Encounter: Payer: Self-pay | Admitting: Internal Medicine

## 2024-05-25 VITALS — BP 104/50 | HR 71 | Temp 98.0°F | Resp 16 | Ht 64.0 in | Wt 139.0 lb

## 2024-05-25 DIAGNOSIS — Z170 Estrogen receptor positive status [ER+]: Secondary | ICD-10-CM

## 2024-05-25 DIAGNOSIS — C50411 Malignant neoplasm of upper-outer quadrant of right female breast: Secondary | ICD-10-CM | POA: Diagnosis not present

## 2024-05-25 LAB — CMP (CANCER CENTER ONLY)
ALT: 32 U/L (ref 0–44)
AST: 29 U/L (ref 15–41)
Albumin: 4.7 g/dL (ref 3.5–5.0)
Alkaline Phosphatase: 78 U/L (ref 38–126)
Anion gap: 10 (ref 5–15)
BUN: 15 mg/dL (ref 6–20)
CO2: 28 mmol/L (ref 22–32)
Calcium: 9.8 mg/dL (ref 8.9–10.3)
Chloride: 101 mmol/L (ref 98–111)
Creatinine: 0.66 mg/dL (ref 0.44–1.00)
GFR, Estimated: >60 mL/min
Glucose, Bld: 100 mg/dL — ABNORMAL HIGH (ref 70–99)
Potassium: 4.1 mmol/L (ref 3.5–5.1)
Sodium: 139 mmol/L (ref 135–145)
Total Bilirubin: 0.4 mg/dL (ref 0.0–1.2)
Total Protein: 7.1 g/dL (ref 6.5–8.1)

## 2024-05-25 LAB — CBC WITH DIFFERENTIAL (CANCER CENTER ONLY)
Abs Immature Granulocytes: 0.01 K/uL (ref 0.00–0.07)
Basophils Absolute: 0 K/uL (ref 0.0–0.1)
Basophils Relative: 0 %
Eosinophils Absolute: 0 K/uL (ref 0.0–0.5)
Eosinophils Relative: 0 %
HCT: 36.1 % (ref 36.0–46.0)
Hemoglobin: 11.7 g/dL — ABNORMAL LOW (ref 12.0–15.0)
Immature Granulocytes: 0 %
Lymphocytes Relative: 30 %
Lymphs Abs: 1.3 K/uL (ref 0.7–4.0)
MCH: 29.9 pg (ref 26.0–34.0)
MCHC: 32.4 g/dL (ref 30.0–36.0)
MCV: 92.3 fL (ref 80.0–100.0)
Monocytes Absolute: 0.4 K/uL (ref 0.1–1.0)
Monocytes Relative: 8 %
Neutro Abs: 2.8 K/uL (ref 1.7–7.7)
Neutrophils Relative %: 62 %
Platelet Count: 311 K/uL (ref 150–400)
RBC: 3.91 MIL/uL (ref 3.87–5.11)
RDW: 12.8 % (ref 11.5–15.5)
WBC Count: 4.5 K/uL (ref 4.0–10.5)
nRBC: 0 % (ref 0.0–0.2)

## 2024-05-25 NOTE — Progress Notes (Signed)
 Energy is fair. Has occ breast discomfort from the surgery. Bowels normal. Hot flashes. Does have achy joints.

## 2024-05-25 NOTE — Assessment & Plan Note (Signed)
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  However tamoxifen  stopped November 2024-Serous retinitis.   # Discussed importance of continued endocrine therapy for total of 5 years at least if not longer 10 years given the need for chemotherapy/high risk MammaPrint.   # Patient  currently on Femara .   Tolerating well. Mammogram July 2025- WNL.   # Hot flashes-postmenopausal status/BSO-stable.  # BMD- June 2020- T score-Normal; BMD -March 2024= T-score -0.3.  On adjuvant Zometa  every 6 months x 9 infusions. Will repeat 2026.- ordered today  # Peripheral neuropathy- G-1- stable  # Intermittent leukopenia/neutropenia-stable.  However mild anemia- Hb 11.7 ? Etiology; last colonoscopy- 2022- stable.   # DISPOSITION:  # Follow up in 6  months- MD; labs- cbc/cmp; vit D 25-OH; BMD prior-   Dr.B

## 2024-05-25 NOTE — Progress Notes (Signed)
 [facility] OFFICE PROGRESS NOTE  Patient Care Team: Patient, [name] Byrnett, Reyes ORN, MD as Surgeon (General Surgery) Cindie Jesusa HERO, RN as Oncology Nurse Navigator Lenn Aran, MD as Referring Physician (Radiation Oncology) Rennie Cindy SAUNDERS, MD as Consulting Physician (Oncology) Rudell Eleanor BROCKS, MD (Inactive) as Referring Physician (Hematology and Oncology)   Cancer Staging  No matching staging information was found for the patient.    Oncology History Overview Note  # May 2019- RIGHT BREAST CA s/p Lumpec & SLNBx [pT1c pN0 (sn); G-1;  margins clear; ER/PR > 90%; Her 2 neu- FISH-NEG. MAMMAPRINT [Dr.Byrnett]- HIGH RISK  # July 29th TC x4 [finished 02/15/2018] s/p RT [dec 11th 2019]; # jan 13th 2020- start Tam; STOPPED in SEP 2020- sec to intolerance [hot flashes]  #Nov 2020-Arimidex  [s/p BSO]; STOPPED MARCH 2022- sec to MSK symptoms; MID April 2022- Aromasin  25 mg/day [stopped 3 weeks- STOPPED sec to RASH].   # MAY 6th, 2022- START TAMOXIFEN  [BSO]; stopped in NOV 2024- [serous retinitis]  # March 20th, 2024- start Letrozole .   # March 9th 2020- Triptorelinq 4q; starting June 2020- switch to q12w; August 2020-BSO; discontinue triptorelin .   # march 9th 2020- Zometa  4 mg/ adjuvant   GENETIC TESTING: [Dr.Byrnett] NEGATIVE FOR - ATM/ BRCA2/CHEK2/PTEN/TP53/BRCA1/CDH1/PALB2/ STK11   DIAGNOSIS: BREAST CA       Malignant neoplasm of upper-outer quadrant of right breast in female, estrogen receptor positive (HCC)  11/03/2017 Initial Diagnosis   Malignant neoplasm of upper-outer quadrant of right breast in female, estrogen receptor positive (HCC)     INTERVAL HISTORY: Patient ambulating-independently.  Alone.    Crystal Branch 54 y.o.  female pleasant patient above history of stage I breast cancer ER PR positive HER-2/neu negative-BSO -currently on letrozole  s here for follow-up.  Discussed the use of AI  scribe software for clinical note transcription with the patient, who gave verbal consent to proceed.  History of Present Illness   Crystal Branch is a 54 year old female with early stage, estrogen receptor positive right breast cancer, status post surgery and adjuvant chemotherapy, currently on adjuvant letrozole , presenting for routine oncology follow-up.  She continues adjuvant letrozole  without significant adverse effects and denies any issues related to its use.  She describes intermittent soreness around the right breast, characterized as a recurring pinching sensation attributed to nerve recovery.  She experiences infrequent hot flashes and more prominent night sweats, both mild and not significantly impacting her quality of life. She is not taking vitamin D  supplementation due to previously elevated levels.  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 She does not experience stumbling or significant functional impairment. No visual symptoms have recurred since discontinuing tamoxifen .  She notes mild lower extremity swelling only after prolonged sitting, with no persistent or unusual edema. She reports occasional shortness of breath but no other new symptoms.       Review of Systems  Constitutional:  Positive for malaise/fatigue. Negative for chills, diaphoresis, fever and weight loss.  HENT:  Negative for nosebleeds and sore throat.   Eyes:  Negative for double vision.  Respiratory:  Negative for cough, hemoptysis, sputum production, shortness of breath and wheezing.   Cardiovascular:  Negative for chest pain, palpitations, orthopnea and leg swelling.  Gastrointestinal:  Negative for abdominal pain, blood in stool, constipation, heartburn, melena, nausea and vomiting.  Genitourinary:  Negative for dysuria, frequency and urgency.  Musculoskeletal:  Positive for joint pain. Negative for back  pain.  Skin:  Negative for itching.  Neurological:  Positive for tingling. Negative for dizziness, focal weakness, weakness and headaches.  Endo/Heme/Allergies:  Does not bruise/bleed easily.  Psychiatric/Behavioral:  Negative for depression. The patient is not nervous/anxious and does not have insomnia.       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  Mammoprint: High risk.    Dysrhythmia    atrial tach/ 2013   Personal history of chemotherapy    Personal history of radiation therapy     PAST SURGICAL HISTORY :   Past Surgical History:  Procedure Laterality Date   BREAST BIOPSY Right 09/30/2017   US  guided biopsy, INVASIVE MAMMARY CARCINOMA WITH MUCINOUS FEATURES ER/PR positive   BREAST LUMPECTOMY Right 10/26/2017   BREAST LUMPECTOMY WITH SENTINEL LYMPH NODE BIOPSY Right 10/26/2017   Procedure: BREAST LUMPECTOMY WITH SENTINEL LYMPH NODE BX;  Surgeon: Dessa Reyes ORN, MD;  Location: [facility] OR[facility];  Service: General;  Laterality: Right;   COLONOSCOPY N/A 01/13/2024   Procedure: COLONOSCOPY;  Surgeon: Tye Millet, DO;  Location: [facility];  Service: General;  Laterality: N/A;   COLONOSCOPY WITH PROPOFOL  N/A 12/01/2018   Procedure: COLONOSCOPY WITH PROPOFOL ;  Surgeon: Dessa Reyes ORN, MD;  Location: [facility];  Service: Endoscopy;  Laterality: N/A;   LAPAROSCOPIC BILATERAL SALPINGO OOPHERECTOMY N/A 01/06/2019   Procedure: LAPAROSCOPIC BILATERAL SALPINGO OOPHORECTOMY;  Surgeon: Leonce Garnette BIRCH, MD;  Location: [facility] OR[facility];  Service: Gynecology;  Laterality: N/A;   TONSILLECTOMY  2015    FAMILY HISTORY :   Family History  Problem Relation Age of Onset   Breast cancer Maternal Aunt 60   Breast cancer Maternal Grandmother 16   Colon cancer Mother 40    SOCIAL HISTORY:   Social History   Tobacco Use    Smoking status: Never   Smokeless tobacco: Never  Vaping Use   Vaping status: Never Used  Substance Use Topics   Alcohol use: Never   Drug use: Never    ALLERGIES:  is allergic to prednisone and exemestane .  MEDICATIONS:  Current Outpatient Medications  Medication Sig Dispense Refill   acetaminophen  (TYLENOL ) 650 MG CR tablet Take 1 tablet by mouth 3 (three) times daily. Prn pain     letrozole  (FEMARA ) 2.5 MG tablet TAKE 1 TABLET BY MOUTH DAILY 90 tablet 3   Misc Natural Products (OSTEO BI-FLEX/5-LOXIN ADVANCED PO) Take 1 capsule by mouth 2 (two) times daily.     Multiple Vitamin (MULTIVITAMIN WITH MINERALS) TABS tablet Take 1 tablet by mouth daily.     Probiotic Product (PHILLIPS COLON HEALTH) CAPS Take 1 capsule by mouth every evening.      No current facility-administered medications for this visit.    PHYSICAL EXAMINATION: ECOG PERFORMANCE STATUS: 0 - Asymptomatic  BP (!) 104/50 (BP Location: Left Arm, Patient Position: Sitting)   Pulse 71   Temp 98 F (36.7 C) (Tympanic)   Resp 16   Ht 5' 4 (1.626 m)   Wt 139 lb (63 kg)   LMP 01/05/2018   SpO2 100%   BMI 23.86 kg/m   Filed Weights   05/25/24 1334  Weight: 139 lb (63 kg)   Physical Exam Constitutional:      Comments: Alone.  Walking by self.  HENT:  Head: Normocephalic and atraumatic.     Mouth/Throat:     Pharynx: No oropharyngeal exudate.  Eyes:     Pupils: Pupils are equal, round, and reactive to light.  Cardiovascular:     Rate and Rhythm: Normal rate and regular rhythm.  Pulmonary:     Effort: No respiratory distress.     Breath sounds: No wheezing.  Abdominal:     General: Bowel sounds are normal. There is no distension.     Palpations: Abdomen is soft. There is no mass.     Tenderness: There is no abdominal tenderness. There is no guarding or rebound.  Musculoskeletal:        General: No tenderness. Normal range of motion.     Cervical back: Normal range of motion and neck supple.  Skin:     General: Skin is warm.  Neurological:     Mental Status: She is alert and oriented to person, place, and time.  Psychiatric:        Mood and Affect: Affect normal.     LABORATORY DATA:  I have reviewed the data as listed    Component Value Date/Time   NA 139 05/25/2024 1306   NA 143 10/13/2017 0920   NA 142 06/19/2011 2227   K 4.1 05/25/2024 1306   K 3.8 06/19/2011 2227   CL 101 05/25/2024 1306   CL 107 06/19/2011 2227   CO2 28 05/25/2024 1306   CO2 21 06/19/2011 2227   GLUCOSE 100 (H) 05/25/2024 1306   GLUCOSE 103 (H) 06/19/2011 2227   BUN 15 05/25/2024 1306   BUN 13 10/13/2017 0920   BUN 10 06/19/2011 2227   CREATININE 0.66 05/25/2024 1306   CREATININE 0.75 06/19/2011 2227   CALCIUM 9.8 05/25/2024 1306   CALCIUM 9.5 06/19/2011 2227   PROT 7.1 05/25/2024 1306   PROT 6.5 10/13/2017 0920   ALBUMIN 4.7 05/25/2024 1306   ALBUMIN 4.6 10/13/2017 0920   AST 29 05/25/2024 1306   ALT 32 05/25/2024 1306   ALKPHOS 78 05/25/2024 1306   BILITOT 0.4 05/25/2024 1306   GFRNONAA >60 05/25/2024 1306   GFRNONAA >60 06/19/2011 2227   GFRAA >60 01/24/2020 1247   GFRAA >60 06/19/2011 2227    No results found for: SPEP, UPEP  Lab Results  Component Value Date   WBC 4.5 05/25/2024   NEUTROABS 2.8 05/25/2024   HGB 11.7 (L) 05/25/2024   HCT 36.1 05/25/2024   MCV 92.3 05/25/2024   PLT 311 05/25/2024      Chemistry      Component Value Date/Time   NA 139 05/25/2024 1306   NA 143 10/13/2017 0920   NA 142 06/19/2011 2227   K 4.1 05/25/2024 1306   K 3.8 06/19/2011 2227   CL 101 05/25/2024 1306   CL 107 06/19/2011 2227   CO2 28 05/25/2024 1306   CO2 21 06/19/2011 2227   BUN 15 05/25/2024 1306   BUN 13 10/13/2017 0920   BUN 10 06/19/2011 2227   CREATININE 0.66 05/25/2024 1306   CREATININE 0.75 06/19/2011 2227      Component Value Date/Time   CALCIUM 9.8 05/25/2024 1306   CALCIUM 9.5 06/19/2011 2227   ALKPHOS 78 05/25/2024 1306   AST 29 05/25/2024 1306   ALT 32  05/25/2024 1306   BILITOT 0.4 05/25/2024 1306       RADIOGRAPHIC STUDIES: I have personally reviewed the radiological images as listed and agreed with the findings in the report. No results found.  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  However tamoxifen  stopped November 2024-Serous retinitis.   # Discussed importance of continued endocrine therapy for total of 5 years at least if not longer 10 years given the need for chemotherapy/high risk MammaPrint.   # Patient  currently on Femara .   Tolerating well. Mammogram July 2025- WNL.   # Hot flashes-postmenopausal status/BSO-stable.  # BMD- June 2020- T score-Normal; BMD -March 2024= T-score -0.3.  On adjuvant Zometa  every 6 months x 9 infusions. Will repeat 2026.- ordered today  # Peripheral neuropathy- G-1- stable  # Intermittent leukopenia/neutropenia-stable.  However mild anemia- Hb 11.7 ? Etiology; last colonoscopy- 2022- stable.   # DISPOSITION:  # Follow up in 6  months- MD; labs- cbc/cmp; vit D 25-OH; BMD prior-   Dr.B    Orders Placed This Encounter  Procedures   DG Bone Density    Standing Status:   Future    Expected Date:   11/22/2024    Expiration Date:   05/25/2025    Reason for Exam (SYMPTOM  OR DIAGNOSIS REQUIRED):   hx breast cancer    Is patient pregnant?:   No    Preferred imaging location?:    [facility]   CBC with Differential (Cancer Center Only)    Standing Status:   Future    Expected Date:   11/22/2024    Expiration Date:   05/25/2025   CMP (Cancer Center only)    Standing Status:   Future    Expected Date:   11/22/2024    Expiration Date:   05/25/2025   VITAMIN D  25 Hydroxy (Vit-D Deficiency, Fractures)    Standing Status:   Future    Expected Date:   11/22/2024    Expiration Date:   05/25/2025    All questions were  answered. The patient knows to call the clinic with any problems, questions or concerns.      Cindy JONELLE Joe, MD 05/25/2024 2:04 PM

## 2024-07-21 ENCOUNTER — Other Ambulatory Visit

## 2024-11-22 ENCOUNTER — Inpatient Hospital Stay

## 2024-11-22 ENCOUNTER — Inpatient Hospital Stay: Admitting: Internal Medicine
